# Patient Record
Sex: Male | Born: 1980 | Race: White | Hispanic: No | Marital: Married | Smoking: Current some day smoker
Health system: Southern US, Community
[De-identification: ages and names within clinical notes are randomized; demographics above are authoritative.]

## PROBLEM LIST (undated history)

## (undated) DIAGNOSIS — G43909 Migraine, unspecified, not intractable, without status migrainosus: Secondary | ICD-10-CM

## (undated) DIAGNOSIS — G40909 Epilepsy, unspecified, not intractable, without status epilepticus: Secondary | ICD-10-CM

## (undated) DIAGNOSIS — F419 Anxiety disorder, unspecified: Secondary | ICD-10-CM

## (undated) DIAGNOSIS — Z8619 Personal history of other infectious and parasitic diseases: Secondary | ICD-10-CM

## (undated) DIAGNOSIS — F5221 Male erectile disorder: Secondary | ICD-10-CM

## (undated) DIAGNOSIS — K219 Gastro-esophageal reflux disease without esophagitis: Secondary | ICD-10-CM

## (undated) HISTORY — DX: Anxiety disorder, unspecified: F41.9

## (undated) HISTORY — DX: Migraine, unspecified, not intractable, without status migrainosus: G43.909

## (undated) HISTORY — DX: Male erectile disorder: F52.21

## (undated) HISTORY — DX: Gastro-esophageal reflux disease without esophagitis: K21.9

## (undated) HISTORY — DX: Personal history of other infectious and parasitic diseases: Z86.19

## (undated) HISTORY — DX: Epilepsy, unspecified, not intractable, without status epilepticus: G40.909

---
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
## 2003-07-30 ENCOUNTER — Encounter (INDEPENDENT_AMBULATORY_CARE_PROVIDER_SITE_OTHER): Payer: Self-pay | Admitting: Internal Medicine

## 2007-01-20 ENCOUNTER — Ambulatory Visit: Payer: Self-pay | Admitting: Internal Medicine

## 2007-01-20 DIAGNOSIS — H6120 Impacted cerumen, unspecified ear: Secondary | ICD-10-CM | POA: Insufficient documentation

## 2007-01-24 ENCOUNTER — Encounter (INDEPENDENT_AMBULATORY_CARE_PROVIDER_SITE_OTHER): Payer: Self-pay | Admitting: Internal Medicine

## 2007-01-26 ENCOUNTER — Encounter (INDEPENDENT_AMBULATORY_CARE_PROVIDER_SITE_OTHER): Payer: Self-pay | Admitting: Internal Medicine

## 2007-02-06 ENCOUNTER — Ambulatory Visit: Payer: Self-pay | Admitting: Internal Medicine

## 2007-02-07 LAB — CONVERTED CEMR LAB
ALT: 13 units/L (ref 0–53)
AST: 16 units/L (ref 0–37)
Albumin: 4.4 g/dL (ref 3.5–5.2)
Alkaline Phosphatase: 57 units/L (ref 39–117)
BUN: 18 mg/dL (ref 6–23)
Basophils Absolute: 0 10*3/uL (ref 0.0–0.1)
Basophils Relative: 0.7 % (ref 0.0–1.0)
Bilirubin, Direct: 0.1 mg/dL (ref 0.0–0.3)
CO2: 30 meq/L (ref 19–32)
Calcium: 9.1 mg/dL (ref 8.4–10.5)
Chloride: 108 meq/L (ref 96–112)
Cholesterol: 127 mg/dL (ref 0–200)
Creatinine, Ser: 1.1 mg/dL (ref 0.4–1.5)
Eosinophils Absolute: 0.2 10*3/uL (ref 0.0–0.6)
Eosinophils Relative: 3.5 % (ref 0.0–5.0)
GFR calc Af Amer: 106 mL/min
GFR calc non Af Amer: 87 mL/min
Glucose, Bld: 86 mg/dL (ref 70–99)
HCT: 44.8 % (ref 39.0–52.0)
HDL: 40.8 mg/dL (ref >39.0)
Hemoglobin: 15.1 g/dL (ref 13.0–17.0)
LDL Cholesterol: 65 mg/dL (ref 0–99)
Lymphocytes Relative: 41.1 % (ref 12.0–46.0)
MCHC: 33.7 g/dL (ref 30.0–36.0)
MCV: 86.4 fL (ref 78.0–100.0)
Monocytes Absolute: 0.7 10*3/uL (ref 0.2–0.7)
Monocytes Relative: 10.7 % (ref 3.0–11.0)
Neutro Abs: 2.7 10*3/uL (ref 1.4–7.7)
Neutrophils Relative %: 44 % (ref 43.0–77.0)
Platelets: 290 10*3/uL (ref 150–400)
Potassium: 3.9 meq/L (ref 3.5–5.1)
RBC: 5.19 M/uL (ref 4.22–5.81)
RDW: 12.7 % (ref 11.5–14.6)
Sodium: 145 meq/L (ref 135–145)
TSH: 1.81 microintl units/mL (ref 0.35–5.50)
Total Bilirubin: 1.1 mg/dL (ref 0.3–1.2)
Total CHOL/HDL Ratio: 3.1
Total Protein: 6.2 g/dL (ref 6.0–8.3)
Triglycerides: 107 mg/dL (ref 0–149)
VLDL: 21 mg/dL (ref 0–40)
WBC: 6.1 10*3/uL (ref 4.5–10.5)

## 2007-07-12 ENCOUNTER — Emergency Department (HOSPITAL_COMMUNITY): Admission: EM | Admit: 2007-07-12 | Discharge: 2007-07-12 | Payer: Self-pay | Admitting: Family Medicine

## 2007-07-16 ENCOUNTER — Emergency Department (HOSPITAL_COMMUNITY): Admission: EM | Admit: 2007-07-16 | Discharge: 2007-07-16 | Payer: Self-pay | Admitting: Emergency Medicine

## 2007-11-02 ENCOUNTER — Ambulatory Visit: Payer: Self-pay | Admitting: Family Medicine

## 2008-02-01 ENCOUNTER — Ambulatory Visit: Payer: Self-pay | Admitting: Family Medicine

## 2008-07-09 ENCOUNTER — Ambulatory Visit: Payer: Self-pay | Admitting: Family Medicine

## 2008-07-09 DIAGNOSIS — J309 Allergic rhinitis, unspecified: Secondary | ICD-10-CM | POA: Insufficient documentation

## 2009-03-20 ENCOUNTER — Ambulatory Visit: Payer: Self-pay | Admitting: Family Medicine

## 2009-08-08 ENCOUNTER — Ambulatory Visit: Payer: Self-pay | Admitting: Family Medicine

## 2009-08-08 DIAGNOSIS — J111 Influenza due to unidentified influenza virus with other respiratory manifestations: Secondary | ICD-10-CM | POA: Insufficient documentation

## 2010-08-04 ENCOUNTER — Ambulatory Visit
Admission: RE | Admit: 2010-08-04 | Discharge: 2010-08-04 | Payer: Self-pay | Source: Home / Self Care | Attending: Family Medicine | Admitting: Family Medicine

## 2010-08-04 ENCOUNTER — Encounter: Payer: Self-pay | Admitting: Family Medicine

## 2010-08-11 NOTE — Assessment & Plan Note (Signed)
Summary: STOMACH,BODY ACHES/CLE   Vital Signs:  Patient profile:   30 year old male Height:      72.25 inches Weight:      183.25 pounds BMI:     24.77 Temp:     101.8 degrees F oral Pulse rate:   92 / minute Pulse rhythm:   regular BP sitting:   104 / 74  (left arm) Cuff size:   large  Vitals Entered By: Delilah Shan CMA Duncan Dull) (August 08, 2009 11:51 AM) CC: Stomach, body aches, fever   History of Present Illness: 30 yo with acute onset of nausea, vomiting, muscle aches yesterday. T max 101.8 (temp in office today). Last vomited this morning, still nauseated. Did develop diarrhea this morning as well. Feels like his sinuses are congested all of a sudden, sneezing. No sick contacts. No cough, wheezing, or shortness of breath. Has been able to keep water down, drinking a lot of it to stay hydrated.  Current Medications (verified): 1)  Advil 200 Mg Tabs (Ibuprofen) .... As Needed 2)  Nasonex 50 Mcg/act Susp (Mometasone Furoate) .... 2 Sprays Each Nostril Once Daily As Needed. 3)  Mucinex 600 Mg Xr12h-Tab (Guaifenesin) .... Otc As Directed. 4)  Promethazine Hcl 25 Mg Tabs (Promethazine Hcl) .Marland Kitchen.. 1 Tab By Mouth Every 6 Hours As Needed For Nausea. 5)  Tamiflu 75 Mg Caps (Oseltamivir Phosphate) .... Take One Capsule By Mouth Daily X 5 Days  Allergies (verified): No Known Drug Allergies  Review of Systems      See HPI General:  Complains of chills, fever, and sweats. ENT:  Complains of nasal congestion; denies earache, postnasal drainage, sinus pressure, and sore throat. GI:  Complains of diarrhea, nausea, and vomiting. MS:  Complains of muscle aches.  Physical Exam  General:  alert, well-developed, well-nourished, and well-hydrated, non toxic. Ears:  TMs retracted with increased fluid bilat Nose:  no airflow obstruction, mucosal erythema, and mucosal edema.  sinuses neg Mouth:  MMM Lungs:  moist cough, no crackles and no wheezes.   Heart:  Normal rate and regular  rhythm. S1 and S2 normal without gallop, murmur, click, rub or other extra sounds. Abdomen:  Bowel sounds positive,abdomen soft and non-tender without masses, organomegaly or hernias noted. Psych:  normally interactive and good eye contact.     Impression & Recommendations:  Problem # 1:  INFLUENZA LIKE ILLNESS (ICD-487.1) Assessment New Within window of Tamiflu, given 5 day course of Tamiflu.   Phenergan for nausea, push fluids. I discussed with the patient that the CDC and [place] Infectious disease personel are recommended that we not do the rapid influenza screening test on people that have a clinical history and exam consistent with the diagnosis. See patient instructions for details.  Complete Medication List: 1)  Advil 200 Mg Tabs (Ibuprofen) .... As needed 2)  Nasonex 50 Mcg/act Susp (Mometasone furoate) .... 2 sprays each nostril once daily as needed. 3)  Mucinex 600 Mg Xr12h-tab (Guaifenesin) .... Otc as directed. 4)  Promethazine Hcl 25 Mg Tabs (Promethazine hcl) .Marland Kitchen.. 1 tab by mouth every 6 hours as needed for nausea. 5)  Tamiflu 75 Mg Caps (Oseltamivir phosphate) .... Take one capsule by mouth daily x 5 days  Patient Instructions: 1)  NIce to meet you, Chrissie Noa. 2)  Sorry you are not feeling well. 3)  This is likely a GI bug but you also have all symptoms of the flu. 4)  Please take Tamiflu 75 mg- 1 tab daily. 5)  Take phenergan  as needed every 4-6 hours. 6)  Drink plenty of fluids. 7)  Call us or go to ER if you develop worsening symptoms over the weekend or cannot keep any fluids down. Prescriptions: TAMIFLU 75 MG CAPS (OSELTAMIVIR PHOSPHATE) Take one capsule by mouth daily x 5 days  #5 x 0   Entered and Authorized by:   Ruthe Mannan MD   Signed by:   Ruthe Mannan MD on 08/08/2009   Method used:   Historical   RxID:   [id number] PROMETHAZINE HCL 25 MG TABS (PROMETHAZINE HCL) 1 tab by mouth every 6 hours as needed for nausea.  #20 x 0   Entered and Authorized by:    Ruthe Mannan MD   Signed by:   Ruthe Mannan MD on 08/08/2009   Method used:   Electronically to        [facility]* (retail)       [address]       [place], [place]  [address]       Ph: [phone]       Fax: [phone]   RxID:   [id number]   Current Allergies (reviewed today): No known allergies

## 2010-08-11 NOTE — Letter (Signed)
Summary: Out of Work  [facility]  [address] [place], [place] [address]   Phone: [phone]  Fax: [phone]    August 08, 2009   Employee:  LOFTON LEON Twiggs    To Whom It May Concern:   For Medical reasons, please excuse the above named employee from work for the following dates:  Start:   08/08/2009  End:   08/11/2009  If you need additional information, please feel free to contact our office.         Sincerely,    Ruthe Mannan MD

## 2010-08-13 NOTE — Assessment & Plan Note (Signed)
Summary: Chest congestion   Vital Signs:  Patient profile:   30 year old male Height:      72.25 inches Weight:      177 pounds BMI:     23.93 Temp:     98.5 degrees F oral Pulse rate:   69 / minute Pulse rhythm:   regular BP sitting:   120 / 80  (right arm) Cuff size:   regular  Vitals Entered By: Linde Gillis CMA Duncan Dull) (August 04, 2010 3:41 PM) CC: chest congestion   History of Present Illness: CC: chest congestion  9d h/o chest congestion.  started with blowing nose and congestion.  tried mucinex x 1+ wk.  Started to be clearing up then recently getting worse.  Also ST.  feeling some better today, but still feels in chest.  + HA.  Mild body aches.  Cough productive of yellow/green sputum, seems to be clearing up.  + nasal drainage, now clearing up.  No fevers/chills.  no abd pain, n/v/d, myalgias or arthralgias, rashes.  + sick contacts at work.  No smokers at home, no h/o asthma/allergies.  Current Medications (verified): 1)  Advil 200 Mg Tabs (Ibuprofen) .... As Needed 2)  Mucinex 600 Mg Xr12h-Tab (Guaifenesin) .... Otc As Directed.  Allergies (verified): No Known Drug Allergies  Social History: no smokers at home Marital Status: Married Children: 0 Occupation: [employer]--works in [employer]  Review of Systems       per HPI  Physical Exam  General:  alert, well-developed, well-nourished, and well-hydrated, non toxic. Head:  Normocephalic and atraumatic without obvious abnormalities. No apparent alopecia or balding.  sinuses NT Eyes:  PERRLA, EOMI, no injection Ears:  TMs clear bilaterally Nose:  nares with discharge bilaterally Mouth:  slight pharyngeal erythema, MMM Neck:  No deformities, masses, or tenderness noted.  no LAD Lungs:  Normal respiratory effort, chest expands symmetrically. Lungs are clear to auscultation, no crackles or wheezes. Heart:  Normal rate and regular rhythm. S1 and S2 normal without gallop, murmur, click, rub or other extra  sounds. Pulses:  2+ rad pulses Extremities:  no pedal edema Skin:  Intact without suspicious lesions or rashes   Impression & Recommendations:  Problem # 1:  URI (ICD-465.9)  Instructed on symptomatic treatment. Call if symptoms persist or worsen.  advised to call at end of week with update if not better with sxs for consideration of abx.  seems to be resolving on its own.  The following medications were removed from the medication list:    Promethazine Hcl 25 Mg Tabs (Promethazine hcl) .Marland Kitchen... 1 tab by mouth every 6 hours as needed for nausea. His updated medication list for this problem includes:    Advil 200 Mg Tabs (Ibuprofen) .Marland Kitchen... As needed    Mucinex 600 Mg Xr12h-tab (Guaifenesin) ..... Otc as directed.    Cheratussin Ac 100-10 Mg/53ml Syrp (Guaifenesin-codeine) ..... One teaspoon at bedtime as needed cough  Complete Medication List: 1)  Advil 200 Mg Tabs (Ibuprofen) .... As needed 2)  Mucinex 600 Mg Xr12h-tab (Guaifenesin) .... Otc as directed. 3)  Cheratussin Ac 100-10 Mg/47ml Syrp (Guaifenesin-codeine) .... One teaspoon at bedtime as needed cough  Patient Instructions: 1)  Sounds like you have a viral upper respiratory infection. 2)  Antibiotics are not needed for this.  Viral infections usually take 7-10 days to resolve.  The cough can last 4 weeks to go away. 3)  Use medication as prescribed: cheratussin 4)  Please return if you are not improving as expected,  or if you have high fevers (>101.5) or difficulty swallowing.  update [place] if not  5)  Call clinic with questions.  Pleasure to see you today!  Prescriptions: CHERATUSSIN AC 100-10 MG/5ML SYRP (GUAIFENESIN-CODEINE) one teaspoon at bedtime as needed cough  #100cc x 0   Entered and Authorized by:   Eustaquio Boyden  MD   Signed by:   Eustaquio Boyden  MD on 08/04/2010   Method used:   Print then Give to Patient   RxID:   [id number]    Orders Added: 1)  Est. Patient Level III [78469]    Current Allergies  (reviewed today): No known allergies

## 2010-10-12 ENCOUNTER — Encounter: Payer: Self-pay | Admitting: Family Medicine

## 2011-04-16 LAB — POCT RAPID STREP A: Streptococcus, Group A Screen (Direct): NEGATIVE

## 2011-04-21 ENCOUNTER — Ambulatory Visit (INDEPENDENT_AMBULATORY_CARE_PROVIDER_SITE_OTHER): Payer: BC Managed Care – PPO | Admitting: Family Medicine

## 2011-04-21 ENCOUNTER — Encounter: Payer: Self-pay | Admitting: Family Medicine

## 2011-04-21 VITALS — BP 116/80 | HR 80 | Temp 98.2°F | Ht 71.0 in | Wt 161.8 lb

## 2011-04-21 DIAGNOSIS — D229 Melanocytic nevi, unspecified: Secondary | ICD-10-CM | POA: Insufficient documentation

## 2011-04-21 DIAGNOSIS — Z113 Encounter for screening for infections with a predominantly sexual mode of transmission: Secondary | ICD-10-CM

## 2011-04-21 DIAGNOSIS — D239 Other benign neoplasm of skin, unspecified: Secondary | ICD-10-CM

## 2011-04-21 DIAGNOSIS — Z8249 Family history of ischemic heart disease and other diseases of the circulatory system: Secondary | ICD-10-CM

## 2011-04-21 DIAGNOSIS — Z23 Encounter for immunization: Secondary | ICD-10-CM

## 2011-04-21 DIAGNOSIS — Z0000 Encounter for general adult medical examination without abnormal findings: Secondary | ICD-10-CM

## 2011-04-21 LAB — BASIC METABOLIC PANEL
CO2: 29 mEq/L (ref 19–32)
Calcium: 9.3 mg/dL (ref 8.4–10.5)
Chloride: 106 mEq/L (ref 96–112)
Glucose, Bld: 94 mg/dL (ref 70–99)
Sodium: 142 mEq/L (ref 135–145)

## 2011-04-21 LAB — LIPID PANEL
HDL: 47 mg/dL (ref >39.00)
Triglycerides: 51 mg/dL (ref 0.0–149.0)

## 2011-04-21 NOTE — Assessment & Plan Note (Signed)
STD screen today.  Return for urine for GC/CT.

## 2011-04-21 NOTE — Assessment & Plan Note (Addendum)
Reviewed preventative protocols. Updated tetanus and flu. Discussed healthy living, rec increased aerobic exercise and decreased fried foods given family history early CAD (father). Reviewed blood work from 2008.  Will recheck today.  Goal LDL <100 likely given fmhx.  Previously at goal.

## 2011-04-21 NOTE — Patient Instructions (Addendum)
I'd recommend increase in aerobic exercise and decrease in fried foods. Blood work today.  Return one morning without voiding to collect urine for rest of STD check. Tdap today, flu shot today. Good to meet you today, call us with questions.

## 2011-04-21 NOTE — Progress Notes (Signed)
Addended by: Josph Macho A on: 04/21/2011 09:15 AM   Modules accepted: Orders
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
## 2011-04-21 NOTE — Assessment & Plan Note (Signed)
Discussed sunscreen use. Multiple nevi, all looking ok, one on left knee darker. rec set up with derm for skin check and to check one on left knee one.  Pt states doesn't need referral for this. Anticipate common wart right hand, rec OTC salicylic acid preparation.

## 2011-04-22 LAB — HIV ANTIBODY (ROUTINE TESTING W REFLEX): HIV: NONREACTIVE

## 2011-04-23 NOTE — Progress Notes (Signed)
Addended by: Alvina Chou on: 04/23/2011 09:43 AM   Modules accepted: Orders

## 2011-04-24 LAB — GC/CHLAMYDIA PROBE AMP, URINE: GC Probe Amp, Urine: NEGATIVE

## 2012-07-09 ENCOUNTER — Ambulatory Visit (INDEPENDENT_AMBULATORY_CARE_PROVIDER_SITE_OTHER): Payer: BC Managed Care – PPO | Admitting: Family Medicine

## 2012-07-09 VITALS — BP 152/88 | HR 110 | Temp 99.0°F | Resp 16 | Ht 73.56 in | Wt 188.0 lb

## 2012-07-09 DIAGNOSIS — B349 Viral infection, unspecified: Secondary | ICD-10-CM

## 2012-07-09 DIAGNOSIS — R05 Cough: Secondary | ICD-10-CM

## 2012-07-09 DIAGNOSIS — R059 Cough, unspecified: Secondary | ICD-10-CM

## 2012-07-09 DIAGNOSIS — B9789 Other viral agents as the cause of diseases classified elsewhere: Secondary | ICD-10-CM

## 2012-07-09 DIAGNOSIS — R509 Fever, unspecified: Secondary | ICD-10-CM

## 2012-07-09 LAB — POCT INFLUENZA A/B: Influenza A, POC: NEGATIVE

## 2012-07-09 MED ORDER — HYDROCODONE-HOMATROPINE 5-1.5 MG/5ML PO SYRP: 5.0000 mL | ORAL_SOLUTION | ORAL | Status: DC | PRN

## 2012-07-09 MED ORDER — BENZONATATE 100 MG PO CAPS: ORAL_CAPSULE | ORAL | Status: DC

## 2012-07-09 MED ORDER — FLUTICASONE PROPIONATE 50 MCG/ACT NA SUSP: 2.0000 | Freq: Every day | NASAL | Status: DC

## 2012-07-09 NOTE — Progress Notes (Signed)
Subjective: Patient has been sick for about 48 hours with a cough, nasal congestion, and is running fevers. Has body aches. He did not do a flu shot this year. Lives alone  Objective: TMs normal. Her nose congested. Throat mildly erythematous. Neck supple without significant nodes. Chest is clear to auscultation.  Assessment: Rales syndrome  Plan: Flu swab

## 2012-07-09 NOTE — Patient Instructions (Signed)
Take tylenol or ibuprofen for fever and aches

## 2012-07-10 ENCOUNTER — Telehealth: Payer: Self-pay | Admitting: Family Medicine

## 2012-07-10 NOTE — Telephone Encounter (Signed)
Patient Information:  Caller Name: Elijah  Phone: [phone]  Patient: Elijah Huang, Elijah Huang  Gender: Male  DOB: 1981/01/30  Age: 31 Years  PCP: Ruthe Mannan [facility])  Office Follow Up:  Does the office need to follow up with this patient?: No  Instructions For The Office: N/A  RN Note:  Has been taking the Motrin 400mg  q6 hours but the fever has returned.  Has been up to 101.  Was swabbed for the flu and same was negative.  Symptoms  Reason For Call & Symptoms: Started with cold/cough sx with a fever on 12/27.  Seen in UC 12/29 and given Hycodon cough meds.  Sputum is yellow.  Reviewed Health History In EMR: Yes  Reviewed Medications In EMR: Yes  Reviewed Allergies In EMR: Yes  Reviewed Surgeries / Procedures: Yes  Date of Onset of Symptoms: 07/07/2012  Any Fever: Yes  Fever Taken: Oral  Fever Time Of Reading: 13:00:00  Fever Last Reading: 99.5  Guideline(s) Used:  Cough  Disposition Per Guideline:   See Today or Tomorrow in Office  Reason For Disposition Reached:   Patient wants to be seen  Advice Given:  N/A  Appointment Scheduled:  07/11/2012 08:15:00 Appointment Scheduled Provider:  Roxy Manns [facility])

## 2012-07-11 NOTE — Telephone Encounter (Signed)
Seen at urgent care and dx with flu 07/09/2012. dispo was to be seen today by Dr. Milinda Antis, but no appt made. Can we call for update on pt today?

## 2012-07-11 NOTE — Telephone Encounter (Signed)
Great thanks.  Yeah agree with looking into appt not scheduled when CAN said scheduled.  This has happened in past.

## 2012-07-11 NOTE — Telephone Encounter (Signed)
Spoke with patient and he said he is feeling much better today and didn't need to be seen. He said he called this morning to cancel the appt that was made for him yesterday, but no one could find it on the schedule, so I'm not sure what happened, but he didn't need it after all. It doesn't appear that one was ever scheduled based on his appt hx even though it was documented by CAN that it was. Will forward to [facility] for investigation with CAN.

## 2012-07-12 DIAGNOSIS — F5221 Male erectile disorder: Secondary | ICD-10-CM

## 2012-07-12 HISTORY — DX: Male erectile disorder: F52.21

## 2012-07-13 ENCOUNTER — Telehealth: Payer: Self-pay

## 2012-07-13 NOTE — Telephone Encounter (Signed)
It may take several days to weeks for a viral cough to resolve.  If he is not worsening, I would suggest he continue the cough medicine.  Plenty of fluids and rest.  If he IS worsening or if he is spiking fevers or developing new symptoms, he should return.

## 2012-07-13 NOTE — Telephone Encounter (Signed)
Patient saw Dr. Alwyn Ren  He is no better, taking OTC and the cough meds that was given to him.  [facility] [place]  [facility]:  [phone] - [address]:  [phone] - W

## 2012-07-13 NOTE — Telephone Encounter (Signed)
Patient states he got better, then symptoms returned. He states cough is not any worse, symptoms just not better.  He states cough medications do help, but he thinks he may now need an antibiotic sent in because he has not improved, does he need to return for this? I told him he may need to, but I would ask . Please advise.

## 2012-07-14 NOTE — Telephone Encounter (Signed)
Thanks, patient advised.  

## 2012-11-09 ENCOUNTER — Ambulatory Visit (INDEPENDENT_AMBULATORY_CARE_PROVIDER_SITE_OTHER): Payer: BC Managed Care – PPO | Admitting: Family Medicine

## 2012-11-09 ENCOUNTER — Encounter: Payer: Self-pay | Admitting: Family Medicine

## 2012-11-09 VITALS — BP 118/80 | HR 80 | Temp 98.6°F | Ht 72.0 in | Wt 186.5 lb

## 2012-11-09 DIAGNOSIS — M542 Cervicalgia: Secondary | ICD-10-CM
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
## 2013-05-04 ENCOUNTER — Ambulatory Visit (INDEPENDENT_AMBULATORY_CARE_PROVIDER_SITE_OTHER): Payer: BC Managed Care – PPO | Admitting: Family Medicine

## 2013-05-04 ENCOUNTER — Encounter: Payer: Self-pay | Admitting: Family Medicine

## 2013-05-04 VITALS — BP 126/82 | HR 76 | Temp 98.4°F | Wt 186.5 lb

## 2013-05-04 DIAGNOSIS — K219 Gastro-esophageal reflux disease without esophagitis: Secondary | ICD-10-CM

## 2013-05-04 MED ORDER — OMEPRAZOLE 40 MG PO CPDR: 40.0000 mg | DELAYED_RELEASE_CAPSULE | Freq: Every day | ORAL | Status: DC

## 2013-05-04 NOTE — Progress Notes (Signed)
Patient seen and examined with PA student Ricarda Frame.  Note reviewed, agree with assessment and plan unless changes documented in my note.  CC: discuss worsening heartburn.  Pleasant 32 yo with no significant pmh presents today with several week worsening of heartburn.  H/o heartburn for years.  Worsening over last 3-5 months.  Takes up to 10 tums a day which cause only temporary relief.  Worse at night.  Describes mid sternal burning.  Tried OTC med (?prilosec) for 1 week 4 months ago with no relief of sxs. Vomiting x 2.  Emesis food.  NBNB. Denies fevers/chills, abd pain, nausea, diarrhea or constipation. No dysphagia, no early satiety, no weight changes. Tried modifying diet.  Avoiding spicy foods - worsens sxs. No change in stress recently. No chest pain, shortness of breath or palpitations.  Smokeless tobacco - chews twice a day. 2 sodas/day, some coffee in am. Goody's powder - about 3-4 x/wk  Body mass index is 25.29 kg/(m^2).   Past Medical History  Diagnosis Date  . History of chicken pox      PE: GEN: NAD, WDWN CM ABD: soft, NABS, ND, mild epigastric discomfort to palpation

## 2013-05-04 NOTE — Patient Instructions (Signed)
Flu shot today. Head of bed elevated. Avoidance of citrus, fatty foods, chocolate, peppermint, and excessive alcohol, along with sodas, orange juice (acidic drinks) At least a few hours between dinner and bed, minimize naps after eating. Treat heartburn with prescription strenth omeprazole 40mg  once daily for 3 weeks then as needed.  Gastroesophageal Reflux Disease, Adult Gastroesophageal reflux disease (GERD) happens when acid from your stomach flows up into the esophagus. When acid comes in contact with the esophagus, the acid causes soreness (inflammation) in the esophagus. Over time, GERD may create small holes (ulcers) in the lining of the esophagus. CAUSES   Increased body weight. This puts pressure on the stomach, making acid rise from the stomach into the esophagus.  Smoking. This increases acid production in the stomach.  Drinking alcohol. This causes decreased pressure in the lower esophageal sphincter (valve or ring of muscle between the esophagus and stomach), allowing acid from the stomach into the esophagus.  Late evening meals and a full stomach. This increases pressure and acid production in the stomach.  A malformed lower esophageal sphincter. Sometimes, no cause is found. SYMPTOMS   Burning pain in the lower part of the mid-chest behind the breastbone and in the mid-stomach area. This may occur twice a week or more often.  Trouble swallowing.  Sore throat.  Dry cough.  Asthma-like symptoms including chest tightness, shortness of breath, or wheezing. DIAGNOSIS  Your caregiver may be able to diagnose GERD based on your symptoms. In some cases, X-rays and other tests may be done to check for complications or to check the condition of your stomach and esophagus. TREATMENT  Your caregiver may recommend over-the-counter or prescription medicines to help decrease acid production. Ask your caregiver before starting or adding any new medicines.  HOME CARE INSTRUCTIONS    Change the factors that you can control. Ask your caregiver for guidance concerning weight loss, quitting smoking, and alcohol consumption.  Avoid foods and drinks that make your symptoms worse, such as:  Caffeine or alcoholic drinks.  Chocolate.  Peppermint or mint flavorings.  Garlic and onions.  Spicy foods.  Citrus fruits, such as oranges, lemons, or limes.  Tomato-based foods such as sauce, chili, salsa, and pizza.  Fried and fatty foods.  Avoid lying down for the 3 hours prior to your bedtime or prior to taking a nap.  Eat small, frequent meals instead of large meals.  Wear loose-fitting clothing. Do not wear anything tight around your waist that causes pressure on your stomach.  Raise the head of your bed 6 to 8 inches with wood blocks to help you sleep. Extra pillows will not help.  Only take over-the-counter or prescription medicines for pain, discomfort, or fever as directed by your caregiver.  Do not take aspirin, ibuprofen, or other nonsteroidal anti-inflammatory drugs (NSAIDs). SEEK IMMEDIATE MEDICAL CARE IF:   You have pain in your arms, neck, jaw, teeth, or back.  Your pain increases or changes in intensity or duration.  You develop nausea, vomiting, or sweating (diaphoresis).  You develop shortness of breath, or you faint.  Your vomit is green, yellow, black, or looks like coffee grounds or blood.  Your stool is red, bloody, or black. These symptoms could be signs of other problems, such as heart disease, gastric bleeding, or esophageal bleeding. MAKE SURE YOU:   Understand these instructions.  Will watch your condition.  Will get help right away if you are not doing well or get worse. Document Released: 04/07/2005 Document Revised: 09/20/2011 Document  Reviewed: 01/15/2011 [employer] Patient Information 2014 [place], [place].

## 2013-05-04 NOTE — Assessment & Plan Note (Signed)
Uncomplicated GERD.  Not responsive to OTC meds. Treat with 3 wk course of prescription omeprazole 40mg  daily. Lifestyle factors that influence GERD discussed in detail. Update if not improving with this. Pt agrees with plan. No red flags today.

## 2013-05-04 NOTE — Progress Notes (Signed)
  Subjective:    Patient ID: Elijah Huang, male    DOB: [birth year], 32 y.o.   MRN: [id number]  HPI Presents with worsening heartburn over last 3-5 months. Past history of mild intermitant heartburn  for several years. Heartburn described as a burning sensation in chest. Worse at nighttime. Thrown up twice due to heartburn (one episode 3 weeks ago and another 2 weeks ago).Tums help for few hours but heartburn persists. Takes approx 10 Tums a day.Tried Prilosec for 1 week 4 months ago without noticeable change in symptoms. Has tried avoiding spicy,fatty foods, and sodas with mild relief. Denies changes in life stressors or dietary habits  Denies dysphagia, early satiety, weight changes, diarrhea, constipation, abdominal pain, SOB, palpitations, cough.   Smokeless tobacco use twice a day Caffeine use:1-2 cups coffee and 1-2 sodas goodys powder approx 3 times a week  Review of Systems Per HPI    Objective:   Physical Exam  Constitutional: He is oriented to person, place, and time. He appears well-developed and well-nourished.  HENT:  Head: Normocephalic and atraumatic.  Mouth/Throat: Oropharynx is clear and moist. No oropharyngeal exudate.  Eyes: Conjunctivae are normal. Pupils are equal, round, and reactive to light. Right eye exhibits no discharge. Left eye exhibits no discharge. No scleral icterus.  Neck: Normal range of motion. Neck supple.  Cardiovascular: Normal rate, regular rhythm and normal heart sounds.  Exam reveals no gallop and no friction rub.   No murmur heard. Pulmonary/Chest: Effort normal and breath sounds normal. No respiratory distress. He has no wheezes. He has no rales. He exhibits no tenderness.  Abdominal: Soft. Bowel sounds are normal. He exhibits no distension and no mass. There is tenderness (epigastric ). There is no rebound and no guarding.  Neurological: He is alert and oriented to person, place, and time.  Skin: Skin is warm and dry. No rash noted. No erythema.        Assessment & Plan:  GERD Omeprazole 40 mg once daily for 3 weeks, then as needed.   Lifestyle changes: Decrease caffeine, goody's powder, spearmints/peppermints.spicy, fatty, acidic foods, tobacco. Avoid eating a few hours before bedtime Elevate head of the bed for nighttime relief.   Instructed to call if symptoms worsen or fail to improve.   Flu shot today.

## 2013-05-30 ENCOUNTER — Ambulatory Visit (INDEPENDENT_AMBULATORY_CARE_PROVIDER_SITE_OTHER): Payer: BC Managed Care – PPO | Admitting: Family Medicine

## 2013-05-30 ENCOUNTER — Encounter: Payer: Self-pay | Admitting: Family Medicine

## 2013-05-30 VITALS — BP 140/96 | HR 68 | Temp 98.9°F | Wt 185.0 lb

## 2013-05-30 DIAGNOSIS — N529 Male erectile dysfunction, unspecified: Secondary | ICD-10-CM | POA: Insufficient documentation

## 2013-05-30 LAB — BASIC METABOLIC PANEL
Calcium: 9.8 mg/dL (ref 8.4–10.5)
Chloride: 103 mEq/L (ref 96–112)
Creatinine, Ser: 1 mg/dL (ref 0.4–1.5)
GFR: 90.91 mL/min (ref >60.00)
Sodium: 138 mEq/L (ref 135–145)

## 2013-05-30 LAB — LIPID PANEL
Cholesterol: 140 mg/dL (ref 0–200)
Triglycerides: 99 mg/dL (ref 0.0–149.0)
VLDL: 19.8 mg/dL (ref 0.0–40.0)

## 2013-05-30 LAB — TSH: TSH: 1.42 u[IU]/mL (ref 0.35–5.50)

## 2013-05-30 NOTE — Progress Notes (Signed)
  Subjective:    Patient ID: Elijah Huang, male    DOB: 1980-09-30, 32 y.o.   MRN: [id number]  HPI CC: discuss personal issue  ED - prior marriage sexually active without trouble.  Divorced.  Then over last 3 years no sexual activity.  Over last 3 months in new relationship - trying intercourse but has been unable to completely obtain erection.  No ejaculation.  Trouble obtaining firm erection in the first place, unable to sustain.  Has not had any nocturnal penile tumescence recently.  Has noticed some trouble obtaining erection when alone as well.  Good libido, sexual desire present. Denies leg pains. Denies chest pain, tightness, dyspnea.  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  Pt does not smoke but dose use smokeless tobacco.  Past Medical History  Diagnosis Date  . History of chicken pox   . GERD (gastroesophageal reflux disease)     Family History  Problem Relation Age of Onset  . Diabetes Father   . Coronary artery disease Father 13    cabg 57, nonsmoker, obese  . Coronary artery disease Paternal Grandfather 35    deceased from same  . Cancer Maternal Grandfather     pancreatic  . Stroke Neg Hx     Review of Systems Per HPI     Objective:   Physical Exam  Nursing note and vitals reviewed. Constitutional: He appears well-developed and well-nourished. No distress.  Cardiovascular: Normal rate, regular rhythm, normal heart sounds and intact distal pulses.   No murmur heard. Pulses:      Carotid pulses are 2+ on the right side, and 2+ on the left side.      Radial pulses are 2+ on the right side, and 2+ on the left side.       Dorsalis pedis pulses are 2+ on the right side, and 2+ on the left side.       Posterior tibial pulses are 2+ on the right side, and 2+ on the left side.  Pulmonary/Chest: Effort normal and breath sounds normal. No respiratory distress. He has no wheezes. He has no rales.  Abdominal: Hernia confirmed negative  in the right inguinal area and confirmed negative in the left inguinal area.  Genitourinary: Testes normal and penis normal. Right testis shows no mass, no swelling and no tenderness. Right testis is descended. Left testis shows no mass, no swelling and no tenderness. Left testis is descended. Circumcised. No hypospadias, penile erythema or penile tenderness.  Unable to reliably palpate penile arteries  Lymphadenopathy:       Right: No inguinal adenopathy present.       Left: No inguinal adenopathy present.       Assessment & Plan:

## 2013-05-30 NOTE — Assessment & Plan Note (Signed)
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  Less likely psychological ED (although this is a new relationship). Strong fmhx premature CAD. Recheck FLP and glu today. I will refer to urology for further evaluation of organic ED in this otherwise healthy 32 yo. Lab Results  Component Value Date   CHOL 115 04/21/2011   HDL 47.00 04/21/2011   LDLCALC 58 04/21/2011   TRIG 51.0 04/21/2011   CHOLHDL 2 04/21/2011

## 2013-05-30 NOTE — Progress Notes (Signed)
Pre-visit discussion using our clinic review tool. No additional management support is needed unless otherwise documented below in the visit note.  

## 2013-05-30 NOTE — Patient Instructions (Signed)
Blood work today. Pass by Marion's office for referral to urologist for further evaluation. I will await evaluation by urologist and then may discuss heart evaluation.

## 2013-05-31 ENCOUNTER — Encounter: Payer: Self-pay | Admitting: *Deleted

## 2013-07-03 ENCOUNTER — Encounter: Payer: Self-pay | Admitting: Family Medicine

## 2013-07-12 DIAGNOSIS — G40909 Epilepsy, unspecified, not intractable, without status epilepticus: Secondary | ICD-10-CM

## 2013-07-12 HISTORY — DX: Epilepsy, unspecified, not intractable, without status epilepticus: G40.909

## 2013-08-09 ENCOUNTER — Ambulatory Visit (INDEPENDENT_AMBULATORY_CARE_PROVIDER_SITE_OTHER): Payer: BC Managed Care – PPO | Admitting: Family Medicine

## 2013-08-09 ENCOUNTER — Telehealth: Payer: Self-pay

## 2013-08-09 ENCOUNTER — Encounter: Payer: Self-pay | Admitting: Family Medicine

## 2013-08-09 VITALS — BP 130/90 | HR 72 | Temp 98.2°F | Wt 185.5 lb

## 2013-08-09 DIAGNOSIS — N529 Male erectile dysfunction, unspecified: Secondary | ICD-10-CM

## 2013-08-09 DIAGNOSIS — R21 Rash and other nonspecific skin eruption: Secondary | ICD-10-CM

## 2013-08-09 MED ORDER — TADALAFIL 5 MG PO TABS: 5.0000 mg | ORAL_TABLET | Freq: Every day | ORAL | Status: DC

## 2013-08-09 NOTE — Assessment & Plan Note (Signed)
Improved with cialis 10mg  daily, but off med noticing sxs returning. Recommended longer trial of med and then prolonged slower taper. Refilled cialis 5mg  daily - pt will price out. If too expensive, consider PRN use of PDE5 inhibitor.

## 2013-08-09 NOTE — Progress Notes (Signed)
Pre-visit discussion using our clinic review tool. No additional management support is needed unless otherwise documented below in the visit note.  

## 2013-08-09 NOTE — Progress Notes (Signed)
   Subjective:    Patient ID: Elijah Huang, male    DOB: [birth year], 33 y.o.   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  Started on low dose PDE5 inhibitor cialis 5mg  daily, rec f/u 08/30/2013.  Started taking 10mg  daily per uro recs - noticing significant improvement with this.  Stopped cialis a few weeks ago with overall some improvement prior to initial evaluation but still having issues with maintenance of erection.  Did complete 30d free sample.  Noticing some redness around nasal bridge and nasal moustache - wonders if due to dry skin.  Does get flakes on skin.  Past Medical History  Diagnosis Date  . History of chicken pox   . GERD (gastroesophageal reflux disease)   . ED (erectile dysfunction) of non-organic origin 2014    thought psychogenic by urology [name])     Review of Systems Per HPI    Objective:   Physical Exam  Nursing note and vitals reviewed. Constitutional: He appears well-developed and well-nourished. No distress.  HENT:  Head:    Erythematous scaling between upper lip and nares. Erythematous rash bilaterally lateral to nasal bridge.  Skin: Skin is warm and dry. Rash noted.       Assessment & Plan:

## 2013-08-09 NOTE — Telephone Encounter (Signed)
Pt wanted Cialis sent to [address]. Advised pt cialis was sent to [facility] [address] and rx sent to [facility] was cancelled. Pt voiced understanding.

## 2013-08-09 NOTE — Assessment & Plan Note (Signed)
Present during winter months. Anticipate seborrheic dermatitis - start moisturizer daily (aveeno or eucerin). If not improved, will do antifungal /steroid combination. Less likely rosacea.  Advised to notify me with update on effect of moisturizer.

## 2013-08-09 NOTE — Patient Instructions (Signed)
Let's try cialis 5mg  daily longer course - daily for 1 month, then may change to every other day then may slowly taper off. Price out cialis and let me know if too expensive for other options. For face - may try regular moisturizing with aveeno or eucerin moisturizing cream.  If not improving, let me know.

## 2013-08-10 ENCOUNTER — Telehealth: Payer: Self-pay | Admitting: Family Medicine

## 2013-08-10 NOTE — Telephone Encounter (Signed)
Relevant patient education assigned to patient using Emmi. ° °

## 2013-08-13 ENCOUNTER — Telehealth: Payer: Self-pay | Admitting: *Deleted

## 2013-08-13 NOTE — Telephone Encounter (Signed)
Filled and placed in my out box. 

## 2013-08-13 NOTE — Telephone Encounter (Signed)
PA for Cialis in your IN box for completion.

## 2013-08-14 NOTE — Telephone Encounter (Signed)
PA forms submitted, pending notification from insurance company.

## 2013-08-22 NOTE — Telephone Encounter (Signed)
Approval form received by insurance company. Already signed by provider will be sent to be scanned into patients medical record. Pharmacy notified via fax.

## 2013-08-30 ENCOUNTER — Telehealth: Payer: Self-pay | Admitting: Family Medicine

## 2013-08-30 MED ORDER — TADALAFIL 20 MG PO TABS: 10.0000 mg | ORAL_TABLET | ORAL | Status: DC | PRN

## 2013-08-30 NOTE — Telephone Encounter (Signed)
Initially received approval for cialis 5mg  daily, now 3d later received denial stating quantity limit is 6 tablets every 22 days Will send in cialis 20mg  #6 instead.   plz notify patient.

## 2013-08-31 NOTE — Telephone Encounter (Signed)
Patient notified

## 2013-12-09 ENCOUNTER — Emergency Department (HOSPITAL_BASED_OUTPATIENT_CLINIC_OR_DEPARTMENT_OTHER): Payer: BC Managed Care – PPO

## 2013-12-09 ENCOUNTER — Encounter (HOSPITAL_BASED_OUTPATIENT_CLINIC_OR_DEPARTMENT_OTHER): Payer: Self-pay | Admitting: Emergency Medicine

## 2013-12-09 ENCOUNTER — Emergency Department (HOSPITAL_BASED_OUTPATIENT_CLINIC_OR_DEPARTMENT_OTHER)
Admission: EM | Admit: 2013-12-09 | Discharge: 2013-12-09 | Disposition: A | Discharge status: Home / Self Care | Payer: BC Managed Care – PPO | Attending: Emergency Medicine | Admitting: Emergency Medicine

## 2013-12-09 DIAGNOSIS — Z79899 Other long term (current) drug therapy: Secondary | ICD-10-CM | POA: Insufficient documentation

## 2013-12-09 DIAGNOSIS — Z791 Long term (current) use of non-steroidal anti-inflammatories (NSAID): Secondary | ICD-10-CM | POA: Insufficient documentation

## 2013-12-09 DIAGNOSIS — R569 Unspecified convulsions: Secondary | ICD-10-CM | POA: Insufficient documentation

## 2013-12-09 DIAGNOSIS — K219 Gastro-esophageal reflux disease without esophagitis: Secondary | ICD-10-CM | POA: Insufficient documentation

## 2013-12-09 DIAGNOSIS — Z8619 Personal history of other infectious and parasitic diseases: Secondary | ICD-10-CM | POA: Insufficient documentation

## 2013-12-09 LAB — RAPID URINE DRUG SCREEN, HOSP PERFORMED
Amphetamines: NOT DETECTED
Barbiturates: NOT DETECTED
Benzodiazepines: NOT DETECTED
Cocaine: NOT DETECTED
Opiates: NOT DETECTED
Tetrahydrocannabinol: NOT DETECTED

## 2013-12-09 LAB — CBC
HCT: 44.7 % (ref 39.0–52.0)
HEMOGLOBIN: 15.8 g/dL (ref 13.0–17.0)
MCH: 29.4 pg (ref 26.0–34.0)
MCHC: 35.3 g/dL (ref 30.0–36.0)
MCV: 83.1 fL (ref 78.0–100.0)
Platelets: 273 10*3/uL (ref 150–400)
RBC: 5.38 MIL/uL (ref 4.22–5.81)
RDW: 12.8 % (ref 11.5–15.5)
WBC: 5.5 10*3/uL (ref 4.0–10.5)

## 2013-12-09 LAB — URINALYSIS, ROUTINE W REFLEX MICROSCOPIC
Bilirubin Urine: NEGATIVE
Glucose, UA: NEGATIVE mg/dL
Hgb urine dipstick: NEGATIVE
KETONES UR: NEGATIVE mg/dL
LEUKOCYTES UA: NEGATIVE
Nitrite: NEGATIVE
PROTEIN: NEGATIVE mg/dL
Specific Gravity, Urine: 1.024 (ref 1.005–1.030)
UROBILINOGEN UA: 0.2 mg/dL (ref 0.0–1.0)
pH: 5.5 (ref 5.0–8.0)

## 2013-12-09 LAB — COMPREHENSIVE METABOLIC PANEL
ALT: 22 U/L (ref 0–53)
AST: 19 U/L (ref 0–37)
Albumin: 4.5 g/dL (ref 3.5–5.2)
Alkaline Phosphatase: 84 U/L (ref 39–117)
BUN: 15 mg/dL (ref 6–23)
CO2: 26 mEq/L (ref 19–32)
Calcium: 9.7 mg/dL (ref 8.4–10.5)
Chloride: 103 mEq/L (ref 96–112)
Creatinine, Ser: 1.1 mg/dL (ref 0.50–1.35)
GFR calc Af Amer: >90 mL/min (ref >90)
GFR calc non Af Amer: 89 mL/min — ABNORMAL LOW (ref >90)
Glucose, Bld: 102 mg/dL — ABNORMAL HIGH (ref 70–99)
Potassium: 3.4 mEq/L — ABNORMAL LOW (ref 3.7–5.3)
Sodium: 143 mEq/L (ref 137–147)
Total Bilirubin: 0.7 mg/dL (ref 0.3–1.2)
Total Protein: 7.2 g/dL (ref 6.0–8.3)

## 2013-12-09 LAB — ETHANOL: Alcohol, Ethyl (B): <11 mg/dL (ref 0–11)

## 2013-12-09 LAB — TROPONIN I

## 2013-12-09 MED ORDER — SODIUM CHLORIDE 0.9 % IV BOLUS (SEPSIS)
1000.0000 mL | Freq: Once | INTRAVENOUS | Status: AC
  Administered 2013-12-09: 1000 mL via INTRAVENOUS

## 2013-12-09 NOTE — ED Notes (Addendum)
Patient states that he was "having weird symptoms" and then Friday night gf states he was having "seizure like"movements. Last night states he fell in the shower, doesn't remember falling, but was alert when girlfriend went in to check on him. States last week he got lightheaded after going outside at work, which can be both indoors and outside. Girlfriend states "he has also had a lot of chest pain and headaches"

## 2013-12-09 NOTE — ED Provider Notes (Signed)
TIME SEEN: 2:27 PM  CHIEF COMPLAINT: Syncope versus seizure  HPI: Patient is a 33 year old male with a history of GERD, erectile dysfunction who presents emergency department with multiple complaints. He states that for the past 2-3 weeks he has had intermittent chest pains mostly at night but only last for several seconds and then resolved. He states he is also had some blurry vision that happens while watching television that also only last for several seconds and resolves with blinking. He states that 4 days ago he had an episode where he bent over and stood up and felt lightheaded and began to sweat. He states this happened after he had been working outside for several hours. He also had a posterior dull headache at that time. He states the main reason he came to the emergency department however is because on Friday morning he had an episode of shaking in bed while he was asleep. He does not remember this event but his girlfriend provides most of the details. She states it lasts approximately 30 seconds and appeared to be generous, chronic seizure. She states she has seen multiple seizures in the past because her sister has a history of epilepsy. She states she was postictal afterwards for approximately 2 hours. He reports he did have some left eye pain and fell he did bite the left side of his tongue. No incontinence. The next day he had an event while he was in the shower where he fell but did not remember the event. There was no seizure-like activity noted at that time. He was not postictal afterwards. He states he still does arm of her falling in the shower but his girlfriend states she found him in the floor of the shower awake but denying falling. Patient states that he is concerned about these episodes because his father had a heart attack at age 47. Patient denies a history of hypertension, diabetes, hyperlipidemia. He does chew tobacco. No history of PE or DVT. No recent prolonged immobilization. No  history of head injury. No recent infectious symptoms including fevers, cough, vomiting or diarrhea. He states he's been eating and drinking well. He is not on anticoagulation.  ROS: See HPI Constitutional: no fever  Eyes: no drainage  ENT: no runny nose   Cardiovascular:  no chest pain  Resp: no SOB  GI: no vomiting GU: no dysuria Integumentary: no rash  Allergy: no hives  Musculoskeletal: no leg swelling  Neurological: no slurred speech ROS otherwise negative  PAST MEDICAL HISTORY/PAST SURGICAL HISTORY:  Past Medical History  Diagnosis Date  . History of chicken pox   . GERD (gastroesophageal reflux disease)   . ED (erectile dysfunction) of non-organic origin 2014    thought psychogenic by urology [name])    MEDICATIONS:  Prior to Admission medications   Medication Sig Start Date End Date Taking? Authorizing Provider  ibuprofen (ADVIL,MOTRIN) 200 MG tablet Take 200 mg by mouth every 6 (six) hours as needed.      Historical Provider, MD  omeprazole (PRILOSEC) 40 MG capsule Take 1 capsule (40 mg total) by mouth daily. 05/04/13   Ria Bush, MD  tadalafil (CIALIS) 20 MG tablet Take 0.5-1 tablets (10-20 mg total) by mouth every other day as needed for erectile dysfunction. 08/30/13   Ria Bush, MD  tadalafil (CIALIS) 5 MG tablet Take 1 tablet (5 mg total) by mouth daily. 08/09/13   Ria Bush, MD    ALLERGIES:  No Known Allergies  SOCIAL HISTORY:  History  Substance Use  Topics  . Smoking status: Never Smoker   . Smokeless tobacco: Current User    Types: Snuff     Comment: occasional  . Alcohol Use: Yes     Comment: occasional    FAMILY HISTORY: Family History  Problem Relation Age of Onset  . Diabetes Father   . Coronary artery disease Father 69    cabg 52, nonsmoker, obese  . Coronary artery disease Paternal Grandfather 85    deceased from same  . Cancer Maternal Grandfather     pancreatic  . Stroke Neg Hx     EXAM: BP 145/78  Pulse 95   Temp(Src) 98.3 F (36.8 C) (Oral)  Resp 16  SpO2 100% CONSTITUTIONAL: Alert and oriented and responds appropriately to questions. Well-appearing; well-nourished on the well-appearing, well-hydrated, nontoxic HEAD: Normocephalic EYES: Conjunctivae clear, PERRL ENT: normal nose; no rhinorrhea; moist mucous membranes; pharynx without lesions noted NECK: Supple, no meningismus, no LAD  CARD: RRR; S1 and S2 appreciated; no murmurs, no clicks, no rubs, no gallops RESP: Normal chest excursion without splinting or tachypnea; breath sounds clear and equal bilaterally; no wheezes, no rhonchi, no rales,  ABD/GI: Normal bowel sounds; non-distended; soft, non-tender, no rebound, no guarding BACK:  The back appears normal and is non-tender to palpation, there is no CVA tenderness EXT: Normal ROM in all joints; non-tender to palpation; no edema; normal capillary refill; no cyanosis    SKIN: Normal color for age and race; warm NEURO: Moves all extremities equally, sensation to light touch intact diffusely, cranial nerves II through XII intact, no dysmetria finger to nose testing, normal gait constant 5/5 in all 4 extremities PSYCH: The patient's mood and manner are appropriate. Grooming and personal hygiene are appropriate.  MEDICAL DECISION MAKING: Patient here with multiple vague symptoms but it appears he did have a likely seizure 2 days ago with tongue biting and postictal period. He may have also had a syncopal event versus another seizure the following day although he was not postictal at that time. He currently has no complaints and is well-appearing, neurologically intact. We'll obtain screening labs, urine, CT head. His EKG shows nonspecific ST changes but no sign of infarct. Have counseled patient on importance of tobacco cessation.  ED PROGRESS: Patient's labs, urine, head CT are completely normal. He still neurologically intact with no complaints. I do not feel he needs to be started on  antiepileptics at this time given it appears he's only had one true seizure. We'll have him followup with neurology as an outpatient. Given [place] and [place] followup information. Have instructed patient that he cannot drive or operate machinery or perform any activity that may be dangerous to himself or others if he were to have another seizure until he is cleared by a neurologist. Have discussed strict return precautions and supportive care instructions. Patient and his girlfriend bedside verbalize understanding and are comfortable with this plan.     EKG Interpretation  Date/Time:  Sunday Dec 09 2013 13:43:39 EDT Ventricular Rate:  94 PR Interval:  140 QRS Duration: 98 QT Interval:  342 QTC Calculation: 427 R Axis:   76 Text Interpretation:  Normal sinus rhythm with sinus arrhythmia ST \\T \ T wave abnormality, consider inferior ischemia Abnormal ECG Confirmed by Kyiah Canepa,  DO, Gianlucas Evenson ([id number]) on 12/09/2013 2:12:11 PM           [place], DO 12/09/13 [id number]

## 2013-12-09 NOTE — Discharge Instructions (Signed)
Driving and Equipment Restrictions Some medical problems make it dangerous to drive, ride a bike, or use machines. Some of these problems are:  A hard blow to the head (concussion).  Passing out (fainting).  Twitching and shaking (seizures).  Low blood sugar.  Taking medicine to help you relax (sedatives).  Taking pain medicines.  Wearing an eye patch.  Wearing splints. This can make it hard to use parts of your body that you need to drive safely. HOME CARE   Do not drive until your doctor says it is okay.  Do not use machines until your doctor says it is okay. You may need a form signed by your doctor (medical release) before you can drive again. You may also need this form before you do other tasks where you need to be fully alert. MAKE SURE YOU:  Understand these instructions.  Will watch your condition.  Will get help right away if you are not doing well or get worse. Document Released: 08/05/2004 Document Revised: 09/20/2011 Document Reviewed: 11/05/2009 [facility] Patient Information 2014 [place].  Seizure, Adult A seizure is abnormal electrical activity in the brain. Seizures usually last from 30 seconds to 2 minutes. There are various types of seizures. Before a seizure, you may have a warning sensation (aura) that a seizure is about to occur. An aura may include the following symptoms:   Fear or anxiety.  Nausea.  Feeling like the room is spinning (vertigo).  Vision changes, such as seeing flashing lights or spots. Common symptoms during a seizure include:  A change in attention or behavior (altered mental status).  Convulsions with rhythmic jerking movements.  Drooling.  Rapid eye movements.  Grunting.  Loss of bladder and bowel control.  Bitter taste in the mouth.  Tongue biting. After a seizure, you may feel confused and sleepy. You may also have an injury resulting from convulsions during the seizure. HOME CARE INSTRUCTIONS   If you  are given medicines, take them exactly as prescribed by your health care provider.  Keep all follow-up appointments as directed by your health care provider.  Do not swim or drive or engage in risky activity during which a seizure could cause further injury to you or others until your health care provider says it is OK.  Get adequate rest.  Teach friends and family what to do if you have a seizure. They should:  Lay you on the ground to prevent a fall.  Put a cushion under your head.  Loosen any tight clothing around your neck.  Turn you on your side. If vomiting occurs, this helps keep your airway clear.  Stay with you until you recover.  Know whether or not you need emergency care. SEEK IMMEDIATE MEDICAL CARE IF:  The seizure lasts longer than 5 minutes.  The seizure is severe or you do not wake up immediately after the seizure.  You have an altered mental status after the seizure.  You are having more frequent or worsening seizures. Someone should drive you to the emergency department or call local emergency services (911 in [place] MAKE SURE YOU:  Understand these instructions.  Will watch your condition.  Will get help right away if you are not doing well or get worse. Document Released: 06/25/2000 Document Revised: 04/18/2013 Document Reviewed: 02/07/2013 [facility] Patient Information 2014 [place].

## 2013-12-13 ENCOUNTER — Ambulatory Visit (INDEPENDENT_AMBULATORY_CARE_PROVIDER_SITE_OTHER): Payer: BC Managed Care – PPO | Admitting: Neurology

## 2013-12-13 ENCOUNTER — Encounter: Payer: Self-pay | Admitting: Neurology

## 2013-12-13 VITALS — BP 138/85 | HR 71 | Ht 72.0 in | Wt 192.0 lb

## 2013-12-13 DIAGNOSIS — R569 Unspecified convulsions: Secondary | ICD-10-CM

## 2013-12-13 DIAGNOSIS — G40909 Epilepsy, unspecified, not intractable, without status epilepticus: Secondary | ICD-10-CM | POA: Insufficient documentation

## 2013-12-13 MED ORDER — DIVALPROEX SODIUM ER 500 MG PO TB24: 500.0000 mg | ORAL_TABLET | Freq: Every day | ORAL | Status: DC

## 2013-12-13 NOTE — Progress Notes (Signed)
PATIENT: Elijah Huang DOB: Feb 24, 1981  HISTORICAL  Elijah Huang is a 33 years old right-handed Caucasian male, accompanied by his girlfriend, referred by his primary care physician Dr. Danise Mina for evaluation of possible seizure  He had past medical history of GERD, erectile dysfunction He presented with seizure-like event, the initial episode was weakness by his girlfriend, Dec 07 2013, after waking up talking with his girlfriend for a while, shortly afterwards, he was noticed to lie on his left side, body shaking, loss of consciousness, lasting for a few minutes, with tongue biting, lip numbness, next day, in May 30th 2015, while taking a shower, his girlfriend heard a loud thundering, he was sitting in a shower, diffuse, did not know what has happened, no self injury,  He denied previous history of seizure, he presented to the emergency room Dec 09 2013, with constellation of complaints, intermittent chest pain, blurry vision, left eye pain, lightheadedness,  He had a CAT scan of the brain that was normal, laboratory showed normal CBC, CMP, negative troponin,   REVIEW OF SYSTEMS: Full 14 system review of systems performed and notable only for fatigue, chest pain, diarrhea, blurry vision, feeling cold, increased thirst, memory loss, confusion, headache, dizziness, seizure  ALLERGIES: No Known Allergies  HOME MEDICATIONS: Current Outpatient Prescriptions on File Prior to Visit  Medication Sig Dispense Refill  . ibuprofen (ADVIL,MOTRIN) 200 MG tablet Take 200 mg by mouth every 6 (six) hours as needed.        Marland Kitchen omeprazole (PRILOSEC) 40 MG capsule Take 1 capsule (40 mg total) by mouth daily.  30 capsule  3  . tadalafil (CIALIS) 20 MG tablet Take 0.5-1 tablets (10-20 mg total) by mouth every other day as needed for erectile dysfunction.  6 tablet  11  . tadalafil (CIALIS) 5 MG tablet Take 1 tablet (5 mg total) by mouth daily.  30 tablet  3   No current facility-administered  medications on file prior to visit.    PAST MEDICAL HISTORY: Past Medical History  Diagnosis Date  . History of chicken pox   . GERD (gastroesophageal reflux disease)   . 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 Tonsillectomy  1989    FAMILY HISTORY: Family History  Problem Relation Age of Onset  . Diabetes Father   . Coronary artery disease Father 28    cabg 35, nonsmoker, obese  . Coronary artery disease Paternal Grandfather 34    deceased from same  . Cancer Maternal Grandfather     pancreatic  . Stroke Neg Hx     SOCIAL HISTORY:  History   Social History  . Marital Status: Married    Spouse Name: N/A    Number of Children: 0  . Years of Education: N/A   Occupational History  . [employer], Librarian, academic    Social History Main Topics  . Smoking status: Never Smoker   . Smokeless tobacco: Current User    Types: Snuff     Comment: occasional  . Alcohol Use: Yes     Comment: occasional  . Drug Use: No  . Sexual Activity: Not Currently   Other Topics Concern  . Not on file   Social History Narrative   Caffeine: 1-2 soft drinks/day   Lives alone, 2 dogs   Occupation: works at [employer]: no regular activity  Diet: good amt water, lots of fried foods, few fruits/vegetables.     PHYSICAL EXAM   Filed Vitals:   12/13/13 1202  BP: 138/85  Pulse: 71  Height: 6' (1.829 m)  Weight: 192 lb (87.091 kg)    Not recorded    Body mass index is 26.03 kg/(m^2).   Generalized: In no acute distress  Neck: Supple, no carotid bruits   Cardiac: Regular rate rhythm  Pulmonary: Clear to auscultation bilaterally  Musculoskeletal: No deformity  Neurological examination  Mentation: Alert oriented to time, place, history taking, and causual conversation  Cranial nerve II-XII: Pupils were equal round  reactive to light. Extraocular movements were full.  Visual field were full on confrontational test. Bilateral fundi were sharp.  Facial sensation and strength were normal. Hearing was intact to finger rubbing bilaterally. Uvula tongue midline.  Head turning and shoulder shrug and were normal and symmetric.Tongue protrusion into cheek strength was normal.  Motor: Normal tone, bulk and strength.  Sensory: Intact to fine touch, pinprick, preserved vibratory sensation, and proprioception at toes.  Coordination: Normal finger to nose, heel-to-shin bilaterally there was no truncal ataxia  Gait: Rising up from seated position without assistance, normal stance, without trunk ataxia, moderate stride, good arm swing, smooth turning, able to perform tiptoe, and heel walking without difficulty.   Romberg signs: Negative  Deep tendon reflexes: Brachioradialis 2/2, biceps 2/2, triceps 2/2, patellar 2/2, Achilles 2/2, plantar responses were flexor bilaterally.   DIAGNOSTIC DATA (LABS, IMAGING, TESTING) - I reviewed patient records, labs, notes, testing and imaging myself where available.  Lab Results  Component Value Date   WBC 5.5 12/09/2013   HGB 15.8 12/09/2013   HCT 44.7 12/09/2013   MCV 83.1 12/09/2013   PLT 273 12/09/2013      Component Value Date/Time   NA 143 12/09/2013 1400   K 3.4* 12/09/2013 1400   CL 103 12/09/2013 1400   CO2 26 12/09/2013 1400   GLUCOSE 102* 12/09/2013 1400   BUN 15 12/09/2013 1400   CREATININE 1.10 12/09/2013 1400   CALCIUM 9.7 12/09/2013 1400   PROT 7.2 12/09/2013 1400   ALBUMIN 4.5 12/09/2013 1400   AST 19 12/09/2013 1400   ALT 22 12/09/2013 1400   ALKPHOS 84 12/09/2013 1400   BILITOT 0.7 12/09/2013 1400   GFRNONAA 89* 12/09/2013 1400   GFRAA >90 12/09/2013 1400   Lab Results  Component Value Date   CHOL 140 05/30/2013   HDL 48.80 05/30/2013   LDLCALC 71 05/30/2013   TRIG 99.0 05/30/2013   CHOLHDL 3 05/30/2013   Lab Results  Component Value Date   TSH 1.42  05/30/2013    ASSESSMENT AND PLAN  Elijah Huang is a 33 y.o. male complains of seizure-like event, normal neurological examination, CAT scan of the brain   Proceed with MRI of the brain with and without contrast, EEG, He has recurrent episode, will start Depakote ER 500 mg every night, Return to clinic in 6 months with Hoyle Sauer, no driving until episode free for 6 months,  Marcial Pacas, M.D. Ph.D.  [facility] [address], [place],  [address] [phone]

## 2013-12-20 ENCOUNTER — Ambulatory Visit (INDEPENDENT_AMBULATORY_CARE_PROVIDER_SITE_OTHER): Payer: BC Managed Care – PPO

## 2013-12-20 ENCOUNTER — Ambulatory Visit (INDEPENDENT_AMBULATORY_CARE_PROVIDER_SITE_OTHER): Payer: BC Managed Care – PPO | Admitting: Radiology

## 2013-12-20 DIAGNOSIS — R569 Unspecified convulsions: Secondary | ICD-10-CM

## 2013-12-24 NOTE — Progress Notes (Signed)
Quick Note:  Shared normal MRI Brain results with patient, he verbalized understanding ______

## 2013-12-28 ENCOUNTER — Telehealth: Payer: Self-pay | Admitting: Neurology

## 2013-12-28 NOTE — Telephone Encounter (Signed)
Pt called would like for someone to call him back concerning the results of his EEG test. Thanks

## 2013-12-28 NOTE — Procedures (Signed)
   HISTORY: 33 years old male, complains of seizure-like events, there was loss of consciousness, and body shaking movement, he did have tongue biting, confusion afterwards  TECHNIQUE:  16 channel EEG was performed based on standard 10-16 international system. One channel was dedicated to EKG, which has demonstrates normal sinus rhythm of 72 beats per minutes.  Upon awakening, the posterior background activity was well-developed, in alpha range, with amplitude of 17 microvoltage, reactive to eye opening and closure.  There was no evidence of epileptiform discharge.  Photic stimulation was performed, which induced a symmetric photic driving.  Hyperventilation was performed, there was no abnormality elicit.  Stage II sleep was achieved.  CONCLUSION: This is a  normal awake and asleep EEG.  There is no electrodiagnostic evidence of epileptiform discharge

## 2013-12-28 NOTE — Telephone Encounter (Signed)
Informed patient that results are not ready as of yet, the physician will review and call back with those results

## 2014-01-03 ENCOUNTER — Encounter: Payer: Self-pay | Admitting: Family Medicine

## 2014-01-03 ENCOUNTER — Ambulatory Visit (INDEPENDENT_AMBULATORY_CARE_PROVIDER_SITE_OTHER): Payer: BC Managed Care – PPO | Admitting: Family Medicine

## 2014-01-03 VITALS — BP 136/78 | HR 83 | Temp 98.6°F | Wt 192.2 lb

## 2014-01-03 DIAGNOSIS — J0100 Acute maxillary sinusitis, unspecified: Secondary | ICD-10-CM

## 2014-01-03 DIAGNOSIS — J019 Acute sinusitis, unspecified: Secondary | ICD-10-CM | POA: Insufficient documentation

## 2014-01-03 MED ORDER — AMOXICILLIN-POT CLAVULANATE 875-125 MG PO TABS: 1.0000 | ORAL_TABLET | Freq: Two times a day (BID) | ORAL | Status: AC

## 2014-01-03 MED ORDER — GUAIFENESIN-CODEINE 100-10 MG/5ML PO SYRP
5.0000 mL | ORAL_SOLUTION | Freq: Two times a day (BID) | ORAL | Status: DC | PRN
Start: 2014-01-03 — End: 2014-04-24

## 2014-01-03 NOTE — Patient Instructions (Signed)
You have a sinus infection, possibly viral at this point. If any worsening or not improving in next 1-2 days, fill antibiotic provided today (augmentin prescription). Push fluids and plenty of rest. Nasal saline irrigation or neti pot to help drain sinuses. May use plain guaifenesin with plenty of fluid to help mobilize mucous. Let us know if fever >101.5, trouble opening/closing mouth, difficulty swallowing, or worsening.

## 2014-01-03 NOTE — Assessment & Plan Note (Addendum)
Anticipate acute left maxillary sinusitis of 7-8 day duration, discussed likely viral at this point cheratussin for cough at night time, discussed sedation precautions. WASP script for augmentin provided with reasons to fill. Supportive care as per instructions.

## 2014-01-03 NOTE — Progress Notes (Signed)
BP 136/78  Pulse 83  Temp(Src) 98.6 F (37 C) (Oral)  Wt 192 lb 4 oz (87.204 kg)  SpO2 97%   CC: sinusitis? Subjective:    Patient ID: Elijah Huang, male    DOB: January 11, 1981, 33 y.o.   MRN: [id number]  HPI: Elijah Huang is a 33 y.o. male presenting on 01/03/2014 for Sinusitis   1+ wk h/o cold sxs.  Started with ST, body aches, felt feverish, sinus and nasal congestion, may be spreading down into chest.  Cough productive of yellow/green mucous over last 2-3 days. Mild headaches. + PNdrainage. Some upper jaw pain yesterday. Cough keeping him up at night time.  No abd pain, ear pain.  Self treated with OTC sinus med (decongestant, expectorant and tylenol). No sick contacts . No smokers at home. No h/o asthma  Since last seen here, had seizures s/p eval by ER then Dr. Addison Lank. Started on depakote which pt is tolerating well.  Workup so far unrevealing.  EEG, MRI normal. Wt Readings from Last 3 Encounters:  01/03/14 192 lb 4 oz (87.204 kg)  12/13/13 192 lb (87.091 kg)  08/09/13 185 lb 8 oz (84.142 kg)  Body mass index is 26.07 kg/(m^2).  Relevant past medical, surgical, family and social history reviewed and updated as indicated.  Allergies and medications reviewed and updated. Current Outpatient Prescriptions on File Prior to Visit  Medication Sig  . divalproex (DEPAKOTE ER) 500 MG 24 hr tablet Take 1 tablet (500 mg total) by mouth at bedtime.  Marland Kitchen omeprazole (PRILOSEC) 40 MG capsule Take 1 capsule (40 mg total) by mouth daily.  . tadalafil (CIALIS) 20 MG tablet Take 0.5-1 tablets (10-20 mg total) by mouth every other day as needed for erectile dysfunction.   No current facility-administered medications on file prior to visit.    Review of Systems Per HPI unless specifically indicated above    Objective:    BP 136/78  Pulse 83  Temp(Src) 98.6 F (37 C) (Oral)  Wt 192 lb 4 oz (87.204 kg)  SpO2 97%  Physical Exam  Nursing note and vitals reviewed. Constitutional: He  appears well-developed and well-nourished. No distress.  HENT:  Head: Normocephalic and atraumatic.  Right Ear: Hearing, tympanic membrane, external ear and ear canal normal.  Left Ear: Hearing, tympanic membrane, external ear and ear canal normal.  Nose: Mucosal edema (nasal mucosal erythema) present. No rhinorrhea. Right sinus exhibits no maxillary sinus tenderness and no frontal sinus tenderness. Left sinus exhibits maxillary sinus tenderness. Left sinus exhibits no frontal sinus tenderness.  Mouth/Throat: Uvula is midline, oropharynx is clear and moist and mucous membranes are normal. No oropharyngeal exudate, posterior oropharyngeal edema, posterior oropharyngeal erythema or tonsillar abscesses.  Eyes: Conjunctivae and EOM are normal. Pupils are equal, round, and reactive to light. No scleral icterus.  Neck: Normal range of motion. Neck supple.  Cardiovascular: Normal rate, regular rhythm, normal heart sounds and intact distal pulses.   No murmur heard. Pulmonary/Chest: Effort normal and breath sounds normal. No respiratory distress. He has no wheezes. He has no rales.  Lymphadenopathy:    He has no cervical adenopathy.  Skin: Skin is warm and dry. No rash noted.       Assessment & Plan:   Problem List Items Addressed This Visit   Acute sinusitis - Primary     Anticipate acute left maxillary sinusitis of 7-8 day duration, discussed likely viral at this point cheratussin for cough at night time, discussed sedation precautions. WASP script  for augmentin provided with reasons to fill. Supportive care as per instructions.    Relevant Medications      amoxicillin-clavulanate (AUGMENTIN) tablet 875-125 mg      cheratussin       Follow up plan: Return if symptoms worsen or fail to improve.

## 2014-01-03 NOTE — Progress Notes (Signed)
Pre visit review using our clinic review tool, if applicable. No additional management support is needed unless otherwise documented below in the visit note. 

## 2014-01-14 ENCOUNTER — Telehealth: Payer: Self-pay | Admitting: Neurology

## 2014-01-14 MED ORDER — DIVALPROEX SODIUM ER 500 MG PO TB24: 1000.0000 mg | ORAL_TABLET | Freq: Every day | ORAL | Status: DC

## 2014-01-14 NOTE — Telephone Encounter (Signed)
Pt called back stating that is has been noticing that he is starring and not responding momentarily and then coming back. Please advise

## 2014-01-14 NOTE — Telephone Encounter (Addendum)
I have talked with Elijah Huang, that he is taking Depakote ER 500mg  po qhs, he still has staring spells, since he started Depakote in June 4th, he has totally 4-5 episodes, last one was July 3rd, he was sitting down, with 3-4 people, when he came around, he was confused.  MRI brain, EEG were normal.  Increase Depakote ER to 500mg  2 tabs qhs.  Hinton Dyer, Please move up his appt with Hoyle Sauer in 3-4 weeks.

## 2014-01-14 NOTE — Telephone Encounter (Signed)
Pt is calling requesting EEG results. Please advise.

## 2014-01-14 NOTE — Addendum Note (Signed)
Addended by: Marcial Pacas on: 01/14/2014 12:57 PM   Modules accepted: Orders

## 2014-01-14 NOTE — Telephone Encounter (Signed)
Patient wanting to know results of recent testing. Please call to advise.

## 2014-01-16 ENCOUNTER — Telehealth: Payer: Self-pay | Admitting: Neurology

## 2014-01-16 ENCOUNTER — Encounter: Payer: Self-pay | Admitting: Neurology

## 2014-01-16 NOTE — Telephone Encounter (Signed)
Patient has an appointment 02-07-14.  Patient needs a note for work stating what he is able to do at work.  Does he need a sooner appointment?  Please call patient and advise--thank you.

## 2014-01-16 NOTE — Telephone Encounter (Signed)
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

## 2014-01-16 NOTE — Telephone Encounter (Signed)
Called Elijah Huang and Elijah Huang stated that he needed Dr. Krista Blue to write a note stating what Elijah Huang can do and cannot do and releasing him to go back to work. I informed the Elijah Huang that Dr. Krista Blue doesn't know what the Elijah Huang job consist of and I asked the Elijah Huang if he could have his job's HR dept fax over to [place] a job description and it would be given to Dr. Krista Blue to review. I advised the Elijah Huang that if he has any other problems, questions or concerns to call the office. Elijah Huang verbalized understanding. FYI

## 2014-01-17 ENCOUNTER — Telehealth: Payer: Self-pay | Admitting: Neurology

## 2014-01-17 NOTE — Telephone Encounter (Signed)
Called pt to inform him per Dr. Krista Blue that the doctor had wrote the pt a note to return to work with limitations and that I had faxed a copy to the pt's HR dept and left a copy up at the front desk for pt to pick up. I advised the pt that if he has any other problems, questions or concerns to call the office. Pt verbalized understanding.

## 2014-01-31 ENCOUNTER — Telehealth: Payer: Self-pay | Admitting: *Deleted

## 2014-01-31 NOTE — Telephone Encounter (Signed)
Called patient to r/s appointment for 7/30, patient was r/s to 02/01/14 at 3:30 pm, patient states that he may have to call back to r/s appointment if his job will not let him leave work early, informed patient that if he needs to r/s this appointment to call back before 3 pm today to avoid "no show" status, patient verbalized understanding.

## 2014-02-01 ENCOUNTER — Telehealth: Payer: Self-pay | Admitting: Nurse Practitioner

## 2014-02-01 ENCOUNTER — Ambulatory Visit: Payer: Self-pay | Admitting: Nurse Practitioner

## 2014-02-01 NOTE — Telephone Encounter (Signed)
No show for scheduled appt 

## 2014-02-07 ENCOUNTER — Ambulatory Visit: Payer: Self-pay | Admitting: Nurse Practitioner

## 2014-04-05 ENCOUNTER — Telehealth: Payer: Self-pay | Admitting: Nurse Practitioner

## 2014-04-05 MED ORDER — DIVALPROEX SODIUM ER 500 MG PO TB24: ORAL_TABLET | ORAL | Status: DC

## 2014-04-05 NOTE — Telephone Encounter (Signed)
Called and spoke to patient He will follow up with Dr.Yan Hoyle Sauer had no openings.

## 2014-04-05 NOTE — Telephone Encounter (Signed)
Spoke with patient, he stated that the episode lasted for about 2 minutes, and when he came back he realized that his arms and hands were shaking, patient wanted to know if he should be seen earlier than his appointment in Dec. Please Advise

## 2014-04-05 NOTE — Telephone Encounter (Signed)
I have called him, he had one episode yesterday pm, he was standing there unresponsive, arms and hands were shaking, he was mildly confused, but could recall some, not all of the detail.  Previous MRI brain, EEG was normal.   He is taking Depakote ER 500mg  2 tabs qhs. I have advised him to increase Depakote one tablet in the morning, 2 tablets every night, prepared trough level when he comes back for a revisit  Hinton Dyer, give him a follow up visit with Hoyle Sauer after Oct 5th.

## 2014-04-05 NOTE — Telephone Encounter (Signed)
Patient calling to state that he had an episode yesterday where he was staring off into space, wants to know if he needs to be seen, please return call and advise.

## 2014-04-17 ENCOUNTER — Ambulatory Visit: Payer: Self-pay | Admitting: Neurology

## 2014-04-24 ENCOUNTER — Encounter: Payer: Self-pay | Admitting: Neurology

## 2014-04-24 ENCOUNTER — Ambulatory Visit (INDEPENDENT_AMBULATORY_CARE_PROVIDER_SITE_OTHER): Payer: BC Managed Care – PPO | Admitting: Neurology

## 2014-04-24 VITALS — BP 136/76 | HR 89 | Ht 72.0 in | Wt 212.0 lb

## 2014-04-24 DIAGNOSIS — R569 Unspecified convulsions: Secondary | ICD-10-CM

## 2014-04-24 MED ORDER — LEVETIRACETAM 1000 MG PO TABS: ORAL_TABLET | ORAL | Status: DC

## 2014-04-24 NOTE — Progress Notes (Signed)
PATIENT: Elijah Huang DOB: 05/11/1981  HISTORICAL  Elijah Huang is a 33 years old right-handed Caucasian male, accompanied by his girlfriend, referred by his primary care physician Dr. Danise Mina for evaluation of possible seizure, with his girl friend Elijah Huang  He had past medical history of GERD, erectile dysfunction  He presented with seizure-like event, the initial episode was witness by his girlfriend, Elijah Huang 2015, after waking up talking with his girlfriend for a while, shortly afterwards, he was noticed to lie on his left side, body shaking, loss of consciousness, lasting for a few minutes, with tongue biting, lip numbness, next day, in Elijah 30th 2015, while taking a shower, his girlfriend heard a loud thundering, he was sitting in a shower, diffuse, did not know what has happened, no self injury,  He denied previous history of seizure, he presented to the emergency room Elijah 33 2015, with constellation of complaints, intermittent chest pain, blurry vision, left eye pain, lightheadedness,  He had a CAT scan of the brain that was normal, laboratory showed normal CBC, CMP, negative troponin,  He works as a Physiological scientist  UPDATE Oct 14th 2015: While taking titrating dose of Depakote he continued to have recurrent sudden onset of lost consciousness,  MRI of the brain was normal, EEG was normal  June 3rd 2015,, spaced out while talking with his nieced, lasting 2 minutes, no seizure activity  June 10th 2015: at work, become unresponsive,  Depakote ER was increased from 500 mg one tablet a day, to 2 tablets every night  July 2nd 2015, in the car with his girl friend, spaced out, lasting 3-4 minues, staring, no body shaking,  July 3rd 2015: goes into staring, noot responside, when he comes too, his hand tremor.  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 divalproex (DEPAKOTE ER) 500 MG 24 hr tablet One po qam and then 2 tabs po qhs  90 tablet  11  . omeprazole (PRILOSEC) 40 MG capsule Take 1 capsule (40 mg total) by mouth daily.  30 capsule  3  . tadalafil (CIALIS) 20 MG tablet Take 0.5-1 tablets (10-20 mg total) by mouth every other day as needed for erectile dysfunction.  6 tablet  11   No current facility-administered medications on file prior to visit.    PAST MEDICAL HISTORY: Past Medical History  Diagnosis Date  . History of chicken pox   . GERD (gastroesophageal reflux disease)   . 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 Seizure disorder 2015    x2, started on depakote, rec no driving for 6 mo (02/8827, Dr Addison Lank)    PAST SURGICAL HISTORY: Past Surgical History  Procedure Laterality Date  . Tonsillectomy  1989    FAMILY HISTORY: Family History  Problem Relation Age of Onset  . Diabetes Father   . Coronary artery disease Father 4    cabg 68, nonsmoker, obese  . Coronary artery disease Paternal Grandfather 23    deceased  from same  . Cancer Maternal Grandfather     pancreatic  . Stroke Neg Hx     SOCIAL HISTORY:  History   Social History  . Marital Status: Married    Spouse Name: N/A    Number of Children: 0  . Years of Education: N/A   Occupational History  . [employer], Librarian, academic    Social History Main Topics  . Smoking status: Never Smoker   . Smokeless tobacco: Current User    Types: Snuff     Comment: occasional  . Alcohol Use: Yes     Comment: occasional  . Drug Use: No  . Sexual Activity: Not  Currently   Other Topics Concern  . Not on file   Social History Narrative   Caffeine: 1-2 soft drinks/day   Lives alone, 2 dogs   Occupation: works at [employer]   Activity: no regular activity   Diet: good amt water, lots of fried foods, few fruits/vegetables.     PHYSICAL EXAM   Filed Vitals:   04/24/14 1114  BP: 136/76  Pulse: 89  Height: 6' (1.829 m)  Weight: 212 lb (96.163 kg)    Not recorded    Body mass index is 28.75 kg/(m^2).   Generalized: In no acute distress  Neck: Supple, no carotid bruits   Cardiac: Regular rate rhythm  Pulmonary: Clear to auscultation bilaterally  Musculoskeletal: No deformity  Neurological examination  Mentation: Alert oriented to time, place, history taking, and causual conversation  Cranial nerve II-XII: Pupils were equal round reactive to light. Extraocular movements were full.  Visual field were full on confrontational test. Bilateral fundi were sharp.  Facial sensation and strength were normal. Hearing was intact to finger rubbing bilaterally. Uvula tongue midline.  Head turning and shoulder shrug and were normal and symmetric.Tongue protrusion into cheek strength was normal.  Motor: Normal tone, bulk and strength.  Sensory: Intact to fine touch, pinprick, preserved vibratory sensation, and proprioception at toes.  Coordination: Normal finger to nose, heel-to-shin bilaterally there was no truncal ataxia  Gait: Rising up from seated position without assistance, normal stance, without trunk ataxia, moderate stride, good arm swing, smooth turning, able to perform tiptoe, and heel walking without difficulty.   Romberg signs: Negative  Deep tendon reflexes: Brachioradialis 2/2, biceps 2/2, triceps 2/2, patellar 2/2, Achilles 2/2, plantar responses were flexor bilaterally.   DIAGNOSTIC DATA (LABS, IMAGING, TESTING) - I reviewed patient records, labs, notes, testing and imaging myself where available.  Lab Results    Component Value Date   WBC 5.5 12/09/2013   HGB 15.8 12/09/2013   HCT 44.7 12/09/2013   MCV 83.1 12/09/2013   PLT 273 12/09/2013      Component Value Date/Time   NA 143 12/09/2013 1400   K 3.4* 12/09/2013 1400   CL 103 12/09/2013 1400   CO2 26 12/09/2013 1400   GLUCOSE 102* 12/09/2013 1400   BUN 15 12/09/2013 1400   CREATININE 1.10 12/09/2013 1400   CALCIUM 9.7 12/09/2013 1400   PROT 7.2 12/09/2013 1400   ALBUMIN 4.5 12/09/2013 1400   AST 19 12/09/2013 1400   ALT 22 12/09/2013 1400   ALKPHOS 84 12/09/2013 1400   BILITOT 0.7 12/09/2013 1400   GFRNONAA 89* 12/09/2013 1400   GFRAA >90 12/09/2013 1400   Lab Results  Component Value Date   CHOL 140 05/30/2013   HDL 48.80 05/30/2013   LDLCALC 71 05/30/2013   TRIG 99.0 05/30/2013   CHOLHDL 3 05/30/2013   Lab  Results  Component Value Date   TSH 1.42 05/30/2013    ASSESSMENT AND PLAN  MAISON KESTENBAUM is a 33 y.o. 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 Ph.D.  [facility] [address] [place], [place] [address] [phone]

## 2014-04-24 NOTE — Patient Instructions (Signed)
Tapering off Depakote, you are taking Depakote ER 500 mg 1 in the morning, 2 tablets every night  First week: 0/2 The second week: 0/1 Third week: 0/0

## 2014-04-25 ENCOUNTER — Telehealth: Payer: Self-pay | Admitting: *Deleted

## 2014-04-25 NOTE — Telephone Encounter (Signed)
Patient calling to r/s appointment with Dr. Krista Blue, patient was r/s to 11/18 at 12 noon, appointment was ok 'd by DC

## 2014-05-08 ENCOUNTER — Ambulatory Visit (INDEPENDENT_AMBULATORY_CARE_PROVIDER_SITE_OTHER): Payer: BC Managed Care – PPO | Admitting: Radiology

## 2014-05-08 DIAGNOSIS — R569 Unspecified convulsions: Secondary | ICD-10-CM

## 2014-05-10 NOTE — Procedures (Signed)
   HISTORY: 33 years old male, with probable partial seizure on Dec 07 2013,  TECHNIQUE:  16 channel EEG was performed based on standard 10-16 international system. One channel was dedicated to EKG, which has demonstrates normal sinus rhythm of 72 beats per minutes.  Upon awakening, the posterior background activity was well-developed, in alpha range, reactive to eye opening and closure.  There was no evidence of epileptiform discharge.  Photic stimulation was performed, which induced a symmetric photic driving.  Hyperventilation was performed, there was no abnormality elicit.  Patient was drowsy, but no deeper stage of sleep was achieved.  CONCLUSION: This is a  normal awake EEG.  There is no electrodiagnostic evidence of epileptiform discharge

## 2014-05-14 ENCOUNTER — Other Ambulatory Visit: Payer: Self-pay | Admitting: Family Medicine

## 2014-05-28 ENCOUNTER — Ambulatory Visit: Payer: BC Managed Care – PPO | Admitting: Neurology

## 2014-05-29 ENCOUNTER — Encounter: Payer: Self-pay | Admitting: Neurology

## 2014-05-29 ENCOUNTER — Ambulatory Visit (INDEPENDENT_AMBULATORY_CARE_PROVIDER_SITE_OTHER): Payer: BC Managed Care – PPO | Admitting: Neurology

## 2014-05-29 VITALS — BP 123/79 | HR 78 | Ht 72.0 in | Wt 211.0 lb

## 2014-05-29 DIAGNOSIS — G40909 Epilepsy, unspecified, not intractable, without status epilepticus: Secondary | ICD-10-CM

## 2014-05-29 MED ORDER — LEVETIRACETAM 1000 MG PO TABS: ORAL_TABLET | ORAL | Status: DC

## 2014-05-29 NOTE — Progress Notes (Signed)
PATIENT: Elijah Huang DOB: 05-25-1981  HISTORICAL  Elijah Huang is a 33 years old right-handed Caucasian male, accompanied by his girlfriend, referred by his primary care physician Dr. Danise Mina for evaluation of possible seizure, with his girl friend April  He had past medical history of GERD, erectile dysfunction  He presented with seizure-like event, the initial episode was witness by his girlfriend, Dec 07 2013, after waking up talking with his girlfriend for a while, shortly afterwards, he was noticed to lie on his left side, body shaking, loss of consciousness, lasting for a few minutes, with tongue biting, lip numbness, next day, in May 30th 2015, while taking a shower, his girlfriend heard a loud thundering, he was sitting in a shower, diffuse, did not know what has happened, no self injury,  He denied previous history of seizure, he presented to the emergency room Dec 09 2013, with constellation of complaints, intermittent chest pain, blurry vision, left eye pain, lightheadedness,  He had a CAT scan of the brain that was normal, laboratory showed normal CBC, CMP, negative troponin,  He works as a Physiological scientist  UPDATE Oct 14th 2015: While taking titrating dose of Depakote he continued to have recurrent sudden onset of lost consciousness,  MRI of the brain was normal, EEG was normal  June 3rd 2015,, spaced out while talking with his nieced, lasting 2 minutes, no seizure activity  June 10th 2015: at work, become unresponsive,  Depakote ER was increased from 500 mg one tablet a day, to 2 tablets every night  July 2nd 2015, in the car with his girl friend, spaced out, lasting 3-4 minues, staring, no body shaking,  July 3rd 2015: goes into staring, noot responside, when he comes too, his hand tremor.  Sept 24th 2015:  At work, he became unresponsive to his manager, staring, hand tremor, mild urinary incontinence  He has no collection of hte events, no warning signs,    He noticed side effects on Depakote this includes weight gain, increased bilateral hands tremor   UPDATE Nov 18th 2015: He is tolerating Keppra 1000 mg twice a day better, He has much less side effect,  He only has one episode of possible seizure, May 22 2014, he fell out of the bed chipped his front teeth, there was no seizure witnessed   REVIEW OF SYSTEMS: Full 14 system review of systems performed and notable only for as above ALLERGIES: No Known Allergies  HOME MEDICATIONS: Current Outpatient Prescriptions on File Prior to Visit  Medication Sig Dispense Refill  . levETIRAcetam (KEPPRA) 1000 MG tablet 1/2 tab po bid xone week, then one tab po bid 60 tablet 11  . omeprazole (PRILOSEC) 40 MG capsule take 1 capsule by mouth once daily 30 capsule 1  . tadalafil (CIALIS) 20 MG tablet Take 0.5-1 tablets (10-20 mg total) by mouth every other day as needed for erectile dysfunction. 6 tablet 11   No current facility-administered medications on file prior to visit.    PAST MEDICAL HISTORY: Past Medical History  Diagnosis Date  . History of chicken pox   . GERD (gastroesophageal reflux disease)   . 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 Seizure disorder 2015    x2, started on depakote, rec no driving for 6 mo (01/5448, Dr Addison Lank)    PAST SURGICAL HISTORY: Past Surgical History  Procedure Laterality Date  . Tonsillectomy  1989    FAMILY HISTORY:  Family History  Problem Relation Age of Onset  . Diabetes Father   . Coronary artery disease Father 88    cabg 34, nonsmoker, obese  . Coronary artery disease Paternal Grandfather 26    deceased from same  . Cancer Maternal Grandfather     pancreatic  . Stroke Neg Hx     SOCIAL HISTORY:  History   Social History  . Marital Status: Married    Spouse Name: N/A    Number of Children: 0  . Years of Education: N/A   Occupational History  . [employer],  Librarian, academic    Social History Main Topics  . Smoking status: Never Smoker   . Smokeless tobacco: Current User    Types: Snuff     Comment: occasional  . Alcohol Use: Yes     Comment: occasional  . Drug Use: No  . Sexual Activity: Not Currently   Other Topics Concern  . Not on file   Social History Narrative   Caffeine: 1-2 soft drinks/day   Lives alone, 2 dogs   Occupation: works at [employer]   Activity: no regular activity   Diet: good amt water, lots of fried foods, few fruits/vegetables.     PHYSICAL EXAM   Filed Vitals:   05/29/14 1212  BP: 123/79  Pulse: 78  Height: 6' (1.829 m)  Weight: 211 lb (95.709 kg)    Not recorded      Body mass index is 28.61 kg/(m^2).   Generalized: In no acute distress  Neck: Supple, no carotid bruits   Cardiac: Regular rate rhythm  Pulmonary: Clear to auscultation bilaterally  Musculoskeletal: No deformity  Neurological examination  Mentation: Alert oriented to time, place, history taking, and causual conversation  Cranial nerve II-XII: Pupils were equal round reactive to light. Extraocular movements were full.  Visual field were full on confrontational test. Bilateral fundi were sharp.  Facial sensation and strength were normal. Hearing was intact to finger rubbing bilaterally. Uvula tongue midline.  Head turning and shoulder shrug and were normal and symmetric.Tongue protrusion into cheek strength was normal.  Motor: Normal tone, bulk and strength.  Sensory: Intact to fine touch, pinprick, preserved vibratory sensation, and proprioception at toes.  Coordination: Normal finger to nose, heel-to-shin bilaterally there was no truncal ataxia  Gait: Rising up from seated position without assistance, normal stance, without trunk ataxia, moderate stride, good arm swing, smooth turning, able to perform tiptoe, and heel walking without difficulty.   Romberg signs: Negative  Deep tendon reflexes: Brachioradialis 2/2,  biceps 2/2, triceps 2/2, patellar 2/2, Achilles 2/2, plantar responses were flexor bilaterally.   DIAGNOSTIC DATA (LABS, IMAGING, TESTING) - I reviewed patient records, labs, notes, testing and imaging myself where available.  Lab Results  Component Value Date   WBC 5.5 12/09/2013   HGB 15.8 12/09/2013   HCT 44.7 12/09/2013   MCV 83.1 12/09/2013   PLT 273 12/09/2013      Component Value Date/Time   NA 143 12/09/2013 1400   K 3.4* 12/09/2013 1400   CL 103 12/09/2013 1400   CO2 26 12/09/2013 1400   GLUCOSE 102* 12/09/2013 1400   BUN 15 12/09/2013 1400   CREATININE 1.10 12/09/2013 1400   CALCIUM 9.7 12/09/2013 1400   PROT 7.2 12/09/2013 1400   ALBUMIN 4.5 12/09/2013 1400   AST 19 12/09/2013 1400   ALT 22 12/09/2013 1400   ALKPHOS 84 12/09/2013 1400   BILITOT 0.7 12/09/2013 1400   GFRNONAA 89* 12/09/2013 1400  GFRAA >90 12/09/2013 1400   Lab Results  Component Value Date   CHOL 140 05/30/2013   HDL 48.80 05/30/2013   LDLCALC 71 05/30/2013   TRIG 99.0 05/30/2013   CHOLHDL 3 05/30/2013   Lab Results  Component Value Date   TSH 1.42 05/30/2013    ASSESSMENT AND PLAN  EPHREM CARRICK is a 33 y.o. male with probable partial seizure seizure, normal neurological examination, CAT scan of the brain, MRI of the brain, EEG   1, increase Keppra to 1000 mg one and half tablets twice a day 2, document all event 3, no driving on to seizure-free for 6 months 4. Return to clinic in 3-4 months  Marcial Pacas, M.D. Ph.D.  [facility] [address] [place], [place] [address] [phone]

## 2014-06-20 ENCOUNTER — Ambulatory Visit: Payer: BC Managed Care – PPO | Admitting: Nurse Practitioner

## 2014-06-24 ENCOUNTER — Telehealth: Payer: Self-pay | Admitting: Neurology

## 2014-08-28 ENCOUNTER — Encounter: Payer: Self-pay | Admitting: *Deleted

## 2014-08-28 ENCOUNTER — Ambulatory Visit (INDEPENDENT_AMBULATORY_CARE_PROVIDER_SITE_OTHER): Payer: BLUE CROSS/BLUE SHIELD | Admitting: Neurology

## 2014-08-28 ENCOUNTER — Encounter: Payer: Self-pay | Admitting: Neurology

## 2014-08-28 VITALS — BP 132/94 | HR 89 | Ht 72.0 in | Wt 212.0 lb

## 2014-08-28 DIAGNOSIS — G40909 Epilepsy, unspecified, not intractable, without status epilepticus: Secondary | ICD-10-CM

## 2014-08-28 DIAGNOSIS — G43009 Migraine without aura, not intractable, without status migrainosus: Secondary | ICD-10-CM

## 2014-08-28 MED ORDER — RIZATRIPTAN BENZOATE 5 MG PO TBDP: 5.0000 mg | ORAL_TABLET | ORAL | Status: DC | PRN

## 2014-08-28 MED ORDER — TOPIRAMATE 50 MG PO TABS: ORAL_TABLET | ORAL | Status: DC

## 2014-08-28 NOTE — Progress Notes (Signed)
PATIENT: IRMA DELANCEY DOB: 1980/11/26  HISTORICAL  KEANU LESNIAK is a 34 years old right-handed Caucasian male, accompanied by his girlfriend, referred by his primary care physician Dr. Danise Mina for evaluation of possible seizure, with his girl friend April  He had past medical history of GERD, erectile dysfunction  He presented with seizure-like event, the initial episode was witness by his girlfriend, Dec 07 2013, after waking up talking with his girlfriend for a while, shortly afterwards, he was noticed to lie on his left side, body shaking, loss of consciousness, lasting for a few minutes, with tongue biting, lip numbness, next day, in May 30th 2015, while taking a shower, his girlfriend heard a loud thundering, he was sitting in the shower, confused, did not know what has happened, no self injury,  He denied previous history of seizure, he presented to the emergency room Dec 09 2013, with constellation of complaints, intermittent chest pain, blurry vision, left eye pain, lightheadedness,  He had a CAT scan of the brain that was normal, laboratory showed normal CBC, CMP, negative troponin,  He works as a Physiological scientist  UPDATE Oct 14th 2015: While taking titrating dose of Depakote he continued to have recurrent sudden onset of lost consciousness,  MRI of the brain was normal, EEG was normal  June 3rd 2015,, spaced out while talking with his nieced, lasting 2 minutes, no seizure activity  June 10th 2015: at work, become unresponsive,  Depakote ER was increased from 500 mg one tablet a day, to 2 tablets every night  July 2nd 2015, in the car with his girl friend, spaced out, lasting 3-4 minues, staring, no body shaking,  July 3rd 2015: goes into staring, noot responside, when he comes too, his hand tremor.  Sept 24th 2015:  At work, he became unresponsive to his manager, staring, hand tremor, mild urinary incontinence  He has no collection of hte events, no warning signs,     He noticed side effects on Depakote this includes weight gain, increased bilateral hands tremor   UPDATE Nov 18th 2015: He is tolerating Keppra 1000 mg twice a day better, He has much less side effect,  He only has one episode of possible seizure, May 22 2014, he fell out of the bed, chipped  his front teeth, there was no seizure witnessed  UPDATE Feb 17th 2016:  He is overall doing very well, taking Keppra 1500 mg twice a day, mild moodiness,  He has episodes of sound sleep,head hang off the bed, difficulty to be awakened, there was no seizure-like activity, couple times each week He also complains of frequent headaches, left parietal region throbbing pounding headaches, with associated light noise sensitivity, lasting for a few minutes to hours, he has been taking frequent Tylenol, he has headaches 5 days out of a week, He also complains of episodes of waking up from sleep, hearing people screaming,  Depakote works for his headache in the past, but causing weight gain, tremor,  REVIEW OF SYSTEMS: Full 14 system review of systems performed and notable only for as above ALLERGIES: No Known Allergies  HOME MEDICATIONS: Current Outpatient Prescriptions on File Prior to Visit  Medication Sig Dispense Refill  . levETIRAcetam (KEPPRA) 1000 MG tablet One and 1/2 tab po bid 90 tablet 11  . omeprazole (PRILOSEC) 40 MG capsule take 1 capsule by mouth once daily 30 capsule 1  . tadalafil (CIALIS) 20 MG tablet Take 0.5-1 tablets (10-20 mg total) by mouth every other day  as needed for erectile dysfunction. 6 tablet 11   No current facility-administered medications on file prior to visit.    PAST MEDICAL HISTORY: Past Medical History  Diagnosis Date  . History of chicken pox   . GERD (gastroesophageal reflux disease)   . 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 Seizure disorder 2015    presumed partial seizures, started on  depakote, rec no driving for 6 mo (10/5623, Dr Addison Lank)    PAST SURGICAL HISTORY: Past Surgical History  Procedure Laterality Date  . Tonsillectomy  1989    FAMILY HISTORY: Family History  Problem Relation Age of Onset  . Diabetes Father   . Coronary artery disease Father 18    cabg 63, nonsmoker, obese  . Coronary artery disease Paternal Grandfather 41    deceased from same  . Cancer Maternal Grandfather     pancreatic  . Stroke Neg Hx     SOCIAL HISTORY:  History   Social History  . Marital Status: Married    Spouse Name: N/A    Number of Children: 0  . Years of Education: N/A   Occupational History  . [employer], Librarian, academic    Social History Main Topics  . Smoking status: Never Smoker   . Smokeless tobacco: Current User    Types: Snuff     Comment: occasional  . Alcohol Use: Yes     Comment: occasional  . Drug Use: No  . Sexual Activity: Not Currently   Other Topics Concern  . Not on file   Social History Narrative   Caffeine: 1-2 soft drinks/day   Lives alone, 2 dogs   Occupation: works at [employer]   Activity: no regular activity   Diet: good amt water, lots of fried foods, few fruits/vegetables.     PHYSICAL EXAM   Filed Vitals:   08/28/14 1118  BP: 132/94  Pulse: 89  Height: 6' (1.829 m)  Weight: 212 lb (96.163 kg)    Not recorded      Body mass index is 28.75 kg/(m^2).   Generalized: In no acute distress  Neck: Supple, no carotid bruits   Cardiac: Regular rate rhythm  Pulmonary: Clear to auscultation bilaterally  Musculoskeletal: No deformity  Neurological examination  Mentation: Alert oriented to time, place, history taking, and causual conversation  Cranial nerve II-XII: Pupils were equal round reactive to light. Extraocular movements were full.  Visual field were full on confrontational test. Bilateral fundi were sharp.  Facial sensation and strength were normal. Hearing was intact to finger rubbing  bilaterally. Uvula tongue midline.  Head turning and shoulder shrug and were normal and symmetric.Tongue protrusion into cheek strength was normal.  Motor: Normal tone, bulk and strength.  Sensory: Intact to fine touch, pinprick, preserved vibratory sensation, and proprioception at toes.  Coordination: Normal finger to nose, heel-to-shin bilaterally there was no truncal ataxia  Gait: Rising up from seated position without assistance, normal stance, without trunk ataxia, moderate stride, good arm swing, smooth turning, able to perform tiptoe, and heel walking without difficulty.   Romberg signs: Negative  Deep tendon reflexes: Brachioradialis 2/2, biceps 2/2, triceps 2/2, patellar 2/2, Achilles 2/2, plantar responses were flexor bilaterally.   DIAGNOSTIC DATA (LABS, IMAGING, TESTING) - I reviewed patient records, labs, notes, testing and imaging myself where available.  Lab Results  Component Value Date   WBC 5.5 12/09/2013   HGB 15.8 12/09/2013   HCT 44.7 12/09/2013  MCV 83.1 12/09/2013   PLT 273 12/09/2013      Component Value Date/Time   NA 143 12/09/2013 1400   K 3.4* 12/09/2013 1400   CL 103 12/09/2013 1400   CO2 26 12/09/2013 1400   GLUCOSE 102* 12/09/2013 1400   BUN 15 12/09/2013 1400   CREATININE 1.10 12/09/2013 1400   CALCIUM 9.7 12/09/2013 1400   PROT 7.2 12/09/2013 1400   ALBUMIN 4.5 12/09/2013 1400   AST 19 12/09/2013 1400   ALT 22 12/09/2013 1400   ALKPHOS 84 12/09/2013 1400   BILITOT 0.7 12/09/2013 1400   GFRNONAA 89* 12/09/2013 1400   GFRAA >90 12/09/2013 1400   Lab Results  Component Value Date   CHOL 140 05/30/2013   HDL 48.80 05/30/2013   LDLCALC 71 05/30/2013   TRIG 99.0 05/30/2013   CHOLHDL 3 05/30/2013   Lab Results  Component Value Date   TSH 1.42 05/30/2013    ASSESSMENT AND PLAN  GIAVONNI FONDER is a 34 y.o. male with probable partial seizure seizure, normal neurological examination, CAT scan of the brain, MRI of the brain, EEG   1,  Keep Keppra to 1500mg  bid 2, add on Topamax, titrating to 100 mg twice a day  3. Maxalt as needed  4, return to clinic in 2 months, if he continued to have episode during sleep, difficulty to be awakened, may even consider video EEG monitoring.    Marcial Pacas, M.D. Ph.D.  [facility] [address], [place], [place] [address] [phone]

## 2014-08-29 ENCOUNTER — Telehealth: Payer: Self-pay | Admitting: *Deleted

## 2014-08-29 ENCOUNTER — Telehealth: Payer: Self-pay | Admitting: Neurology

## 2014-08-29 MED ORDER — TOPIRAMATE 50 MG PO TABS: ORAL_TABLET | ORAL | Status: DC

## 2014-08-29 MED ORDER — RIZATRIPTAN BENZOATE 5 MG PO TBDP: 5.0000 mg | ORAL_TABLET | ORAL | Status: DC | PRN

## 2014-08-29 NOTE — Telephone Encounter (Signed)
Rx's have been re-sent to [facility].

## 2014-08-29 NOTE — Telephone Encounter (Signed)
Attempted call - unable to reach - will try again.

## 2014-08-29 NOTE — Telephone Encounter (Signed)
I have updated his Pharmacy information.  Michelle: we may faxed the letter you generated during his office visit to his HR.

## 2014-08-29 NOTE — Telephone Encounter (Signed)
Asa Lente with [facility] @ [phone], checking status of working restrictions.  Please call and advise.

## 2014-08-29 NOTE — Telephone Encounter (Signed)
Patient calling wanting Topamax and Maxalt sent to [facility]. The Rx was sent to the [facility] instead. Any questions the patient can be reached at  [phone]

## 2014-08-30 ENCOUNTER — Encounter: Payer: Self-pay | Admitting: *Deleted

## 2014-08-30 NOTE — Telephone Encounter (Signed)
Called Elijah Huang - left a detailed message on her personal voicemail - clarified that he is not able to drive for six month after his last seizure that occurred on 05/22/14 - as long as he has no further seizure activity, he will be able to drive again on 5/37/48.  He did not have any other restrictions stated in the letter.

## 2014-09-17 ENCOUNTER — Ambulatory Visit: Payer: Self-pay | Admitting: Family Medicine

## 2014-09-19 ENCOUNTER — Ambulatory Visit (INDEPENDENT_AMBULATORY_CARE_PROVIDER_SITE_OTHER): Payer: BLUE CROSS/BLUE SHIELD | Admitting: Family Medicine

## 2014-09-19 ENCOUNTER — Encounter: Payer: Self-pay | Admitting: Family Medicine

## 2014-09-19 VITALS — BP 118/84 | HR 96 | Temp 98.1°F | Wt 202.5 lb

## 2014-09-19 DIAGNOSIS — K219 Gastro-esophageal reflux disease without esophagitis: Secondary | ICD-10-CM

## 2014-09-19 DIAGNOSIS — G40909 Epilepsy, unspecified, not intractable, without status epilepticus: Secondary | ICD-10-CM

## 2014-09-19 DIAGNOSIS — N529 Male erectile dysfunction, unspecified: Secondary | ICD-10-CM

## 2014-09-19 MED ORDER — TADALAFIL 20 MG PO TABS: 10.0000 mg | ORAL_TABLET | ORAL | Status: DC | PRN

## 2014-09-19 MED ORDER — OMEPRAZOLE 40 MG PO CPDR: 40.0000 mg | DELAYED_RELEASE_CAPSULE | ORAL | Status: DC

## 2014-09-19 NOTE — Assessment & Plan Note (Signed)
Followed by neurology, appreciate their assistance. Still not driving - hopeful for 11/20/2014

## 2014-09-19 NOTE — Assessment & Plan Note (Signed)
cialis effective. Will refill. Coupon provided today.

## 2014-09-19 NOTE — Progress Notes (Signed)
Pre visit review using our clinic review tool, if applicable. No additional management support is needed unless otherwise documented below in the visit note. 

## 2014-09-19 NOTE — Patient Instructions (Addendum)
Omeprazole and cialis refilled  Head of bed elevated. Avoidance of citrus, fatty foods, chocolate, peppermint, and excessive alcohol, along with sodas, orange juice (acidic drinks) At least a few hours between dinner and bed, minimize naps after eating. Good to see you today, call us with questions.

## 2014-09-19 NOTE — Progress Notes (Signed)
   BP 118/84 mmHg  Pulse 96  Temp(Src) 98.1 F (36.7 C) (Oral)  Wt 202 lb 8 oz (91.853 kg)   CC: med refill visit  Subjective:    Patient ID: Elijah Huang, male    DOB: 06-16-1981, 34 y.o.   MRN: [id number]  HPI: Elijah Huang is a 34 y.o. male presenting on 09/19/2014 for Medication Refill   Here for med refill visit.  Partial seizures - followed by neurology. depakote caused bad side effects as well as breakthrough seizures. This was changed to Keppra 1500mg  bid and Topamax 100mg  bid. maxalt under tongue added for headaches that are thought to be seizure related.  GERD - on omeprazole 40mg  every few days to control heartburn. Finds certain triggers (tomato based sauces). Denies nausea/vomiting, dysphagia, early satiety.  ED - cialis effective. Saw urologist - ?psychogenic. We prescribed 5mg  daily but insurance would not cover this.   Weight down 10 lbs - likely from topamax appetite suppressant effect.  Relevant past medical, surgical, family and social history reviewed and updated as indicated. Interim medical history since our last visit reviewed. Allergies and medications reviewed and updated. Current Outpatient Prescriptions on File Prior to Visit  Medication Sig  . levETIRAcetam (KEPPRA) 1000 MG tablet One and 1/2 tab po bid  . rizatriptan (MAXALT-MLT) 5 MG disintegrating tablet Take 1 tablet (5 mg total) by mouth as needed. May repeat in 2 hours if needed  . topiramate (TOPAMAX) 50 MG tablet One po bid x one week, then 2 tabs po bid   No current facility-administered medications on file prior to visit.    Review of Systems Per HPI unless specifically indicated above     Objective:    BP 118/84 mmHg  Pulse 96  Temp(Src) 98.1 F (36.7 C) (Oral)  Wt 202 lb 8 oz (91.853 kg)  Wt Readings from Last 3 Encounters:  09/19/14 202 lb 8 oz (91.853 kg)  08/28/14 212 lb (96.163 kg)  05/29/14 211 lb (95.709 kg)    Physical Exam  Constitutional: He appears well-developed  and well-nourished. No distress.  HENT:  Head: Normocephalic and atraumatic.  Mouth/Throat: Oropharynx is clear and moist. No oropharyngeal exudate.  Eyes: Conjunctivae and EOM are normal. Pupils are equal, round, and reactive to light. No scleral icterus.  Neck: Normal range of motion. Neck supple. Carotid bruit is not present.  Cardiovascular: Normal rate, regular rhythm, normal heart sounds and intact distal pulses.   No murmur heard. Pulmonary/Chest: Breath sounds normal. No respiratory distress. He has no wheezes. He has no rales.  Skin: Skin is warm and dry. No rash noted.  Vitals reviewed.     Assessment & Plan:   Problem List Items Addressed This Visit    Seizure disorder - Primary    Followed by neurology, appreciate their assistance. Still not driving - hopeful for 11/20/2014      GERD (gastroesophageal reflux disease)    Discussed use of med. Continue omeprazole 40mg  MWF. Discussed dietary changes that could help.      Relevant Medications   omeprazole (PRILOSEC) capsule   Erectile dysfunction    cialis effective. Will refill. Coupon provided today.          Follow up plan: Return in about 1 year (around 09/19/2015), or as needed, for follow up visit.

## 2014-09-19 NOTE — Assessment & Plan Note (Signed)
Discussed use of med. Continue omeprazole 40mg  MWF. Discussed dietary changes that could help.

## 2014-10-15 ENCOUNTER — Telehealth: Payer: Self-pay | Admitting: *Deleted

## 2014-10-15 NOTE — Telephone Encounter (Signed)
PA required for Cialis. Completed through [email or web address]. Will await determination.

## 2014-10-21 ENCOUNTER — Emergency Department (HOSPITAL_COMMUNITY)
Admission: EM | Admit: 2014-10-21 | Discharge: 2014-10-21 | Disposition: A | Discharge status: Home / Self Care | Payer: BLUE CROSS/BLUE SHIELD | Attending: Emergency Medicine | Admitting: Emergency Medicine

## 2014-10-21 ENCOUNTER — Telehealth: Payer: Self-pay | Admitting: Family Medicine

## 2014-10-21 ENCOUNTER — Emergency Department (HOSPITAL_COMMUNITY): Payer: BLUE CROSS/BLUE SHIELD

## 2014-10-21 ENCOUNTER — Encounter (HOSPITAL_COMMUNITY): Payer: Self-pay | Admitting: Emergency Medicine

## 2014-10-21 DIAGNOSIS — R911 Solitary pulmonary nodule: Secondary | ICD-10-CM | POA: Insufficient documentation

## 2014-10-21 DIAGNOSIS — N529 Male erectile dysfunction, unspecified: Secondary | ICD-10-CM | POA: Insufficient documentation

## 2014-10-21 DIAGNOSIS — K219 Gastro-esophageal reflux disease without esophagitis: Secondary | ICD-10-CM | POA: Insufficient documentation

## 2014-10-21 DIAGNOSIS — G40909 Epilepsy, unspecified, not intractable, without status epilepticus: Secondary | ICD-10-CM | POA: Diagnosis not present

## 2014-10-21 DIAGNOSIS — R079 Chest pain, unspecified: Secondary | ICD-10-CM | POA: Insufficient documentation

## 2014-10-21 DIAGNOSIS — Z8619 Personal history of other infectious and parasitic diseases: Secondary | ICD-10-CM | POA: Insufficient documentation

## 2014-10-21 DIAGNOSIS — Z79899 Other long term (current) drug therapy: Secondary | ICD-10-CM | POA: Insufficient documentation

## 2014-10-21 LAB — CBC
HCT: 48.2 % (ref 39.0–52.0)
Hemoglobin: 16.6 g/dL (ref 13.0–17.0)
MCH: 28.6 pg (ref 26.0–34.0)
MCHC: 34.4 g/dL (ref 30.0–36.0)
MCV: 83 fL (ref 78.0–100.0)
Platelets: 277 10*3/uL (ref 150–400)
RBC: 5.81 MIL/uL (ref 4.22–5.81)
RDW: 13.2 % (ref 11.5–15.5)
WBC: 4.8 10*3/uL (ref 4.0–10.5)

## 2014-10-21 LAB — BASIC METABOLIC PANEL
Anion gap: 10 (ref 5–15)
BUN: 14 mg/dL (ref 6–23)
CO2: 21 mmol/L (ref 19–32)
Calcium: 9.6 mg/dL (ref 8.4–10.5)
Chloride: 112 mmol/L (ref 96–112)
Creatinine, Ser: 1.23 mg/dL (ref 0.50–1.35)
GFR calc non Af Amer: 77 mL/min — ABNORMAL LOW (ref >90)
GFR, EST AFRICAN AMERICAN: 89 mL/min — AB (ref >90)
Glucose, Bld: 113 mg/dL — ABNORMAL HIGH (ref 70–99)
Potassium: 3.9 mmol/L (ref 3.5–5.1)
SODIUM: 143 mmol/L (ref 135–145)

## 2014-10-21 LAB — I-STAT TROPONIN, ED: Troponin i, poc: 0 ng/mL (ref 0.00–0.08)

## 2014-10-21 LAB — D-DIMER, QUANTITATIVE (NOT AT ARMC): D DIMER QUANT: 0.87 ug{FEU}/mL — AB (ref 0.00–0.48)

## 2014-10-21 MED ORDER — IOHEXOL 350 MG/ML SOLN
80.0000 mL | Freq: Once | INTRAVENOUS | Status: AC | PRN
  Administered 2014-10-21: 80 mL via INTRAVENOUS

## 2014-10-21 NOTE — Discharge Instructions (Signed)
Chest Wall Pain Chest wall pain is pain in or around the bones and muscles of your chest. It may take up to 6 weeks to get better. It may take longer if you must stay physically active in your work and activities.  CAUSES  Chest wall pain may happen on its own. However, it may be caused by:  A viral illness like the flu.  Injury.  Coughing.  Exercise.  Arthritis.  Fibromyalgia.  Shingles. HOME CARE INSTRUCTIONS   Avoid overtiring physical activity. Try not to strain or perform activities that cause pain. This includes any activities using your chest or your abdominal and side muscles, especially if heavy weights are used.  Put ice on the sore area.  Put ice in a plastic bag.  Place a towel between your skin and the bag.  Leave the ice on for 15-20 minutes per hour while awake for the first 2 days.  Only take over-the-counter or prescription medicines for pain, discomfort, or fever as directed by your caregiver. SEEK IMMEDIATE MEDICAL CARE IF:   Your pain increases, or you are very uncomfortable.  You have a fever.  Your chest pain becomes worse.  You have new, unexplained symptoms.  You have nausea or vomiting.  You feel sweaty or lightheaded.  You have a cough with phlegm (sputum), or you cough up blood. MAKE SURE YOU:   Understand these instructions.  Will watch your condition.  Will get help right away if you are not doing well or get worse. Document Released: 06/28/2005 Document Revised: 09/20/2011 Document Reviewed: 02/22/2011 [facility] Patient Information 2015 [place], [place]. This information is not intended to replace advice given to you by your health care provider. Make sure you discuss any questions you have with your health care provider. Pulmonary Nodule A pulmonary nodule is a small, round growth of tissue in the lung. Pulmonary nodules can range in size from less than 1/5 inch (4 mm) to a little bigger than an inch (25 mm). Most pulmonary nodules  are detected when imaging tests of the lung are being performed for a different problem. Pulmonary nodules are usually not cancerous (benign). However, some pulmonary nodules are cancerous (malignant). Follow-up treatment or testing is based on the size of the pulmonary nodule and your risk of getting lung cancer.  CAUSES Benign pulmonary nodules can be caused by various things. Some of the causes include:   Bacterial, fungal, or viral infections. This is usually an old infection that is no longer active, but it can sometimes be a current, active infection.  A benign mass of tissue.  Inflammation from conditions such as rheumatoid arthritis.   Abnormal blood vessels in the lungs. Malignant pulmonary nodules can result from lung cancer or from cancers that spread to the lung from other places in the body. SIGNS AND SYMPTOMS Pulmonary nodules usually do not cause symptoms. DIAGNOSIS Most often, pulmonary nodules are found incidentally when an X-Mccue or CT scan is performed to look for some other problem in the lung area. To help determine whether a pulmonary nodule is benign or malignant, your health care provider will take a medical history and order a variety of tests. Tests done may include:   Blood tests.  A skin test called a tuberculin test. This test is used to determine if you have been exposed to the germ that causes tuberculosis.   Chest X-rays. If possible, a new X-Gavel may be compared with X-rays you have had in the past.   CT scan.  This test shows smaller pulmonary nodules more clearly than an X-Carico.   Positron emission tomography (PET) scan. In this test, a safe amount of a radioactive substance is injected into the bloodstream. Then, the scan takes a picture of the pulmonary nodule. The radioactive substance is eliminated from your body in your urine.   Biopsy. A tiny piece of the pulmonary nodule is removed so it can be checked under a microscope. TREATMENT  Pulmonary  nodules that are benign normally do not require any treatment because they usually do not cause symptoms or breathing problems. Your health care provider may want to monitor the pulmonary nodule through follow-up CT scans. The frequency of these CT scans will vary based on the size of the nodule and the risk factors for lung cancer. For example, CT scans will need to be done more frequently if the pulmonary nodule is larger and if you have a history of smoking and a family history of cancer. Further testing or biopsies may be done if any follow-up CT scan shows that the size of the pulmonary nodule has increased. HOME CARE INSTRUCTIONS  Only take over-the-counter or prescription medicines as directed by your health care provider.  Keep all follow-up appointments with your health care provider. SEEK MEDICAL CARE IF:  You have trouble breathing when you are active.   You feel sick or unusually tired.   You do not feel like eating.   You lose weight without trying to.   You develop chills or night sweats.  SEEK IMMEDIATE MEDICAL CARE IF:  You cannot catch your breath, or you begin wheezing.   You cannot stop coughing.   You cough up blood.   You become dizzy or feel like you are going to pass out.   You have sudden chest pain.   You have a fever or persistent symptoms for more than 2-3 days.   You have a fever and your symptoms suddenly get worse. MAKE SURE YOU:  Understand these instructions.  Will watch your condition.  Will get help right away if you are not doing well or get worse. Document Released: 04/25/2009 Document Revised: 02/28/2013 Document Reviewed: 12/18/2012 [facility] Patient Information 2015 [place], [place]. This information is not intended to replace advice given to you by your health care provider. Make sure you discuss any questions you have with your health care provider.

## 2014-10-21 NOTE — ED Provider Notes (Signed)
CSN: [id number]     Arrival date & time 10/21/14  1000 History   First MD Initiated Contact with Patient 10/21/14 1018     Chief Complaint  Patient presents with  . Chest Pain     (Consider location/radiation/quality/duration/timing/severity/associated sxs/prior Treatment) Patient is a 34 y.o. male presenting with chest pain. The history is provided by the patient. No language interpreter was used.  Chest Pain Pain location:  L chest Pain quality: aching   Pain radiates to:  Does not radiate Pain radiates to the back: no   Pain severity:  Mild Onset quality:  Gradual Timing:  Constant Progression:  Worsening Chronicity:  New Context: no movement   Relieved by:  Nothing Worsened by:  Nothing tried Ineffective treatments:  None tried Associated symptoms: no abdominal pain, no fever and not vomiting   Risk factors: no high cholesterol and no hypertension   Pt complains of pain in his chest.  Pt reports pain began on Saturday after work.   Pt reports pain is constant and mild with intermittant severe pain.  Past Medical History  Diagnosis Date  . History of chicken pox   . GERD (gastroesophageal reflux disease)   . 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 Seizure disorder 2015    presumed partial seizures, started on depakote, rec no driving for 6 mo (11/4268, Dr Addison Lank)   Past Surgical History  Procedure Laterality Date  . Tonsillectomy  1989   Family History  Problem Relation Age of Onset  . Diabetes Father   . Coronary artery disease Father 44    cabg 77, nonsmoker, obese  . Coronary artery disease Paternal Grandfather 36    deceased from same  . Cancer Maternal Grandfather     pancreatic  . Stroke Neg Hx    History  Substance Use Topics  . Smoking status: Never Smoker   . Smokeless tobacco: Current User    Types: Snuff     Comment: occasional  . Alcohol Use: 0.0 oz/week    0 Standard drinks or  equivalent per week     Comment: occasional    Review of Systems  Constitutional: Negative for fever.  Cardiovascular: Positive for chest pain.  Gastrointestinal: Negative for vomiting and abdominal pain.  All other systems reviewed and are negative.     Allergies  Review of patient's allergies indicates no known allergies.  Home Medications   Prior to Admission medications   Medication Sig Start Date End Date Taking? Authorizing Provider  levETIRAcetam (KEPPRA) 1000 MG tablet One and 1/2 tab po bid 05/29/14   Marcial Pacas, MD  omeprazole (PRILOSEC) 40 MG capsule Take 1 capsule (40 mg total) by mouth every Monday, Wednesday, and Friday. 09/19/14   Ria Bush, MD  rizatriptan (MAXALT-MLT) 5 MG disintegrating tablet Take 1 tablet (5 mg total) by mouth as needed. May repeat in 2 hours if needed 08/29/14   Marcial Pacas, MD  tadalafil (CIALIS) 20 MG tablet Take 0.5-1 tablets (10-20 mg total) by mouth every other day as needed for erectile dysfunction. 09/19/14   Ria Bush, MD  topiramate (TOPAMAX) 50 MG tablet One po bid x one week, then 2 tabs po bid 08/29/14   Marcial Pacas, MD   Ht 6' (1.829 m)  Wt 192 lb 6 oz (87.261 kg)  BMI 26.09 kg/m2 Physical Exam  Constitutional: He appears well-developed.  HENT:  Head: Normocephalic.  Right Ear: External ear normal.  Nose: Nose normal.  Eyes: Conjunctivae are normal. Pupils are equal, round, and reactive to light.  Neck: Normal range of motion. Neck supple.  Cardiovascular: Normal rate and normal heart sounds.   Pulmonary/Chest: Effort normal and breath sounds normal.  Tender mid chest,  Slight pain with palpation,  Pain with inspiration  Abdominal: Soft.  Musculoskeletal: Normal range of motion.  Neurological: He is alert.  Skin: Skin is warm.  Psychiatric: He has a normal mood and affect.    ED Course  Procedures (including critical care time) Labs Review Labs Reviewed  CBC  BASIC METABOLIC PANEL  I-STAT TROPOININ, ED    Results for orders placed or performed during the hospital encounter of 10/21/14  CBC  Result Value Ref Range   WBC 4.8 4.0 - 10.5 K/uL   RBC 5.81 4.22 - 5.81 MIL/uL   Hemoglobin 16.6 13.0 - 17.0 g/dL   HCT 48.2 39.0 - 52.0 %   MCV 83.0 78.0 - 100.0 fL   MCH 28.6 26.0 - 34.0 pg   MCHC 34.4 30.0 - 36.0 g/dL   RDW 13.2 11.5 - 15.5 %   Platelets 277 150 - 400 K/uL  Basic metabolic panel  Result Value Ref Range   Sodium 143 135 - 145 mmol/L   Potassium 3.9 3.5 - 5.1 mmol/L   Chloride 112 96 - 112 mmol/L   CO2 21 19 - 32 mmol/L   Glucose, Bld 113 (H) 70 - 99 mg/dL   BUN 14 6 - 23 mg/dL   Creatinine, Ser 1.23 0.50 - 1.35 mg/dL   Calcium 9.6 8.4 - 10.5 mg/dL   GFR calc non Af Amer 77 (L) >90 mL/min   GFR calc Af Amer 89 (L) >90 mL/min   Anion gap 10 5 - 15  D-dimer, quantitative  Result Value Ref Range   D-Dimer, Quant 0.87 (H) 0.00 - 0.48 ug/mL-FEU  I-stat troponin, ED (not at [facility])  Result Value Ref Range   Troponin i, poc 0.00 0.00 - 0.08 ng/mL   Comment 3           Dg Chest 2 View  10/21/2014   CLINICAL DATA:  Chest pain, history of GERD  EXAM: CHEST  2 VIEW  COMPARISON:  None.  FINDINGS: Cardiomediastinal silhouette is unremarkable. No acute infiltrate or pleural effusion. No pulmonary edema. Bony thorax is unremarkable.  IMPRESSION: No active cardiopulmonary disease.   Electronically Signed   By: Lahoma Crocker M.D.   On: 10/21/2014 12:08   Ct Angio Chest Pe W/cm &/or Wo Cm  10/21/2014   CLINICAL DATA:  Chest pain for 2 days  EXAM: CT ANGIOGRAPHY CHEST WITH CONTRAST  TECHNIQUE: Multidetector CT imaging of the chest was performed using the standard protocol during bolus administration of intravenous contrast. Multiplanar CT image reconstructions and MIPs were obtained to evaluate the vascular anatomy.  CONTRAST:  84mL OMNIPAQUE IOHEXOL 350 MG/ML SOLN  COMPARISON:  Chest radiograph October 21, 2014.  FINDINGS: There is no demonstrable pulmonary embolus. There is no thoracic aortic  aneurysm or dissection.  On axial slice 58 series 6, there is a 6 mm nodular opacity in the inferior lingula. There is mild atelectasis in the inferior lingula as well. Lungs elsewhere clear. No edema or consolidation.  There is no appreciable thoracic adenopathy. The pericardium is not thickened. Visualized thyroid is normal.  In the visualized upper abdomen, no lesions are appreciable.  There are no blastic or lytic bone lesions.  Review of the MIP images confirms  the above findings.  IMPRESSION: No demonstrable pulmonary embolus.  6 mm nodular opacity inferior lingula. Followup of this nodular opacity should be based on Fleischner Society guidelines. If the patient is at high risk for bronchogenic carcinoma, follow-up chest CT at 6-12 months is recommended. If the patient is at low risk for bronchogenic carcinoma, follow-up chest CT at 12 months is recommended. This recommendation follows the consensus statement: Guidelines for Management of Small Pulmonary Nodules Detected on CT Scans: A Statement from the [employer] as published in Radiology 2005;237:395-400.  No edema or consolidation. No apparent adenopathy. There is mild atelectasis in the inferior lingula.   Electronically Signed   By: Lowella Grip III M.D.   On: 10/21/2014 13:48    Imaging Review No results found.   EKG Interpretation   Date/Time:  Monday October 21 2014 10:14:47 EDT Ventricular Rate:  108 PR Interval:  130 QRS Duration: 88 QT Interval:  324 QTC Calculation: 434 R Axis:   85 Text Interpretation:  Sinus tachycardia Right atrial enlargement Cannot  rule out Inferior infarct , age undetermined Abnormal ECG Confirmed by  Hazle Coca [phone]) on 10/21/2014 10:17:42 AM      MDM  Pt had elevated ddimer.   Ct chest obtained.  Ct shows 43mm nodule.   Radiologist recommended 6-12 month follow up.  Pt advised/   Pt has normal troponin.   Pt advised to follow up with Dr. Criss Alvine for recheck in 3-4 days.    Final  diagnoses:  Chest pain        Fransico Meadow, PA-C 10/21/14 Axis, MD 10/21/14 [phone]

## 2014-10-21 NOTE — ED Notes (Signed)
PA at bedside.

## 2014-10-21 NOTE — ED Notes (Signed)
Patient transported to CT 

## 2014-10-21 NOTE — Telephone Encounter (Signed)
plz call for update - would offer appt tomorrow if not seen at ER.

## 2014-10-21 NOTE — Telephone Encounter (Signed)
Patient Name: Elijah Huang  DOB: 1980-12-10    Initial Comment Caller states he has been having chest pains since Saturday    Nurse Assessment  Nurse: Leilani Merl, RN, Heather Date/Time (Eastern Time): 10/21/2014 9:10:43 AM  Confirm and document reason for call. If symptomatic, describe symptoms. ---Caller states he has been having chest pains since Saturday, off and on and it is worse today.  Has the patient traveled out of the country within the last 30 days? ---Not Applicable  Does the patient require triage? ---Yes  Related visit to physician within the last 2 weeks? ---No  Does the PT have any chronic conditions? (i.e. diabetes, asthma, etc.) ---Yes  List chronic conditions. ---seizures,     Guidelines    Guideline Title Affirmed Question Affirmed Notes  Chest Pain [1] Intermittent chest pain or "angina" AND [2] increasing in severity or frequency (Exception: pains lasting a few seconds)    Final Disposition User   Go to ED Now [place], RN, Nira Conn    Comments  Not able to find patient in system, verified name and birth date, unable to document call in EPIC.  Found patient in system, record documented in EPIC

## 2014-10-21 NOTE — ED Notes (Signed)
Pt c/o chest pain onset Saturday afternoon after getting off work. No other symptoms with the pain.

## 2014-10-23 NOTE — Telephone Encounter (Signed)
Did go to [facility] ER. ? Pulled muscle. Lung nodule found. Follow up scheduled.

## 2014-10-23 NOTE — Telephone Encounter (Signed)
Approved. Pharmacy notified

## 2014-10-24 ENCOUNTER — Ambulatory Visit (INDEPENDENT_AMBULATORY_CARE_PROVIDER_SITE_OTHER): Payer: BLUE CROSS/BLUE SHIELD | Admitting: Family Medicine

## 2014-10-24 ENCOUNTER — Encounter: Payer: Self-pay | Admitting: Family Medicine

## 2014-10-24 VITALS — BP 110/68 | HR 79 | Temp 98.0°F | Ht 72.0 in | Wt 193.0 lb

## 2014-10-24 DIAGNOSIS — R911 Solitary pulmonary nodule: Secondary | ICD-10-CM

## 2014-10-24 DIAGNOSIS — R0789 Other chest pain: Secondary | ICD-10-CM

## 2014-10-24 NOTE — Assessment & Plan Note (Signed)
ER records reviewed. Anticipate rib cage strain after heavy lifting over the weekend. Continues slowly improving, continue tylenol and heating pad. Declines Rx NSAID or muscle relaxant. Reassured not cardiac cause.

## 2014-10-24 NOTE — Progress Notes (Signed)
BP 110/68 mmHg  Pulse 79  Temp(Src) 98 F (36.7 C) (Oral)  Ht 6' (1.829 m)  Wt 193 lb (87.544 kg)  BMI 26.17 kg/m2  SpO2 99%   CC: ER f/u visit  Subjective:    Patient ID: Elijah Huang, male    DOB: 11-Jul-1981, 34 y.o.   MRN: [id number]  HPI: Elijah Huang is a 34 y.o. male presenting on 10/24/2014 for ER follow up   Seen at ER on 10/21/2014 with chest pain - started Saturday evening after work. Recurred and worsened on Sunday, affected sleep. Called our office Monday and [facility] advised he go to ER.  No fevers/chills, dyspnea, palpitations, wheezing, cough.   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 Wonders about chest wall strain. Pain was reproducible and positional. Not exertional.  Has f/u appt on 4/20 with neurology.  EKG reviewed - sinus tachycardia with nonspecific ST changes lateral leads, normal axis and intervals. CBC, BMP normal, TnI neg x 1, D dimer elevated so CT obtained. Negative for PE but did have 75mm pulm nodule. rec f/u in 6-12 months.   Strong fmhx CAD.  Ct Angio Chest Pe W/cm &/or Wo Cm 10/21/2014 CLINICAL DATA: Chest pain for 2 days EXAM: CT ANGIOGRAPHY CHEST WITH CONTRAST TECHNIQUE: Multidetector CT imaging of the chest was performed using the standard protocol during bolus administration of intravenous contrast. Multiplanar CT image reconstructions and MIPs were obtained to evaluate the vascular anatomy. CONTRAST: 79mL OMNIPAQUE IOHEXOL 350 MG/ML SOLN COMPARISON: Chest radiograph October 21, 2014. FINDINGS: There is no demonstrable pulmonary embolus. There is no thoracic aortic aneurysm or dissection. On axial slice 58 series 6, there is a 6 mm nodular opacity in the inferior lingula. There is mild atelectasis in the inferior lingula as well. Lungs elsewhere clear. No edema or consolidation. There is no appreciable thoracic adenopathy. The pericardium is not thickened. Visualized thyroid is normal. In the visualized upper  abdomen, no lesions are appreciable. There are no blastic or lytic bone lesions. Review of the MIP images confirms the above findings. IMPRESSION: No demonstrable pulmonary embolus. 6 mm nodular opacity inferior lingula. Followup of this nodular opacity should be based on Fleischner Society guidelines. If the patient is at high risk for bronchogenic carcinoma, follow-up chest CT at 6-12 months is recommended. If the patient is at low risk for bronchogenic carcinoma, follow-up chest CT at 12 months is recommended. This recommendation follows the consensus statement: Guidelines for Management of Small Pulmonary Nodules Detected on CT Scans: A Statement from the [place] as published in Radiology 2005;237:395-400. No edema or consolidation. No apparent adenopathy. There is mild atelectasis in the inferior lingula. Electronically Signed By: Lowella Grip III M.D. On: 10/21/2014 13:48  Relevant past medical, surgical, family and social history reviewed and updated as indicated. Interim medical history since our last visit reviewed. Allergies and medications reviewed and updated. Current Outpatient Prescriptions on File Prior to Visit  Medication Sig  . cetirizine (ZYRTEC) 10 MG tablet Take 10 mg by mouth daily as needed.   . levETIRAcetam (KEPPRA) 500 MG tablet Take 1,500 mg by mouth 2 (two) times daily.   Marland Kitchen omeprazole (PRILOSEC) 40 MG capsule Take 1 capsule (40 mg total) by mouth every Monday, Wednesday, and Friday.  . rizatriptan (MAXALT-MLT) 5 MG disintegrating tablet Take 1 tablet (5 mg total) by mouth as needed. May repeat in 2 hours if needed  . tadalafil (CIALIS) 20 MG tablet Take 0.5-1 tablets (10-20 mg total) by  mouth every other day as needed for erectile dysfunction.  . topiramate (TOPAMAX) 50 MG tablet One po bid x one week, then 2 tabs po bid   No current facility-administered medications on file prior to visit.   Family History  Problem Relation Age of Onset  .  Diabetes Father   . Coronary artery disease Father 23    cabg 76, nonsmoker, obese  . Coronary artery disease Paternal Grandfather 99    deceased from same  . Cancer Maternal Grandfather     pancreatic  . Stroke Neg Hx      Review of Systems Per HPI unless specifically indicated above     Objective:    BP 110/68 mmHg  Pulse 79  Temp(Src) 98 F (36.7 C) (Oral)  Ht 6' (1.829 m)  Wt 193 lb (87.544 kg)  BMI 26.17 kg/m2  SpO2 99%  Wt Readings from Last 3 Encounters:  10/24/14 193 lb (87.544 kg)  10/21/14 192 lb 6 oz (87.261 kg)  09/19/14 202 lb 8 oz (91.853 kg)    Physical Exam  Constitutional: He appears well-developed and well-nourished. No distress.  HENT:  Mouth/Throat: Oropharynx is clear and moist. No oropharyngeal exudate.  Eyes: Conjunctivae and EOM are normal. Pupils are equal, round, and reactive to light. No scleral icterus.  Cardiovascular: Normal rate, regular rhythm, normal heart sounds and intact distal pulses.   No murmur heard. Pulmonary/Chest: Effort normal and breath sounds normal. No respiratory distress. He has no wheezes. He has no rales. He exhibits tenderness.  Tender to palpation mid sternum as well as L 3rd/4th costochondral joints and intercostals  Skin: Skin is warm and dry. No rash noted.  Psychiatric: He has a normal mood and affect.  Nursing note and vitals reviewed.  Results for orders placed or performed during the hospital encounter of 10/21/14  CBC  Result Value Ref Range   WBC 4.8 4.0 - 10.5 K/uL   RBC 5.81 4.22 - 5.81 MIL/uL   Hemoglobin 16.6 13.0 - 17.0 g/dL   HCT 48.2 39.0 - 52.0 %   MCV 83.0 78.0 - 100.0 fL   MCH 28.6 26.0 - 34.0 pg   MCHC 34.4 30.0 - 36.0 g/dL   RDW 13.2 11.5 - 15.5 %   Platelets 277 150 - 400 K/uL  Basic metabolic panel  Result Value Ref Range   Sodium 143 135 - 145 mmol/L   Potassium 3.9 3.5 - 5.1 mmol/L   Chloride 112 96 - 112 mmol/L   CO2 21 19 - 32 mmol/L   Glucose, Bld 113 (H) 70 - 99 mg/dL   BUN 14  6 - 23 mg/dL   Creatinine, Ser 1.23 0.50 - 1.35 mg/dL   Calcium 9.6 8.4 - 10.5 mg/dL   GFR calc non Af Amer 77 (L) >90 mL/min   GFR calc Af Amer 89 (L) >90 mL/min   Anion gap 10 5 - 15  D-dimer, quantitative  Result Value Ref Range   D-Dimer, Quant 0.87 (H) 0.00 - 0.48 ug/mL-FEU  I-stat troponin, ED (not at [facility])  Result Value Ref Range   Troponin i, poc 0.00 0.00 - 0.08 ng/mL   Comment 3              Assessment & Plan:   Problem List Items Addressed This Visit    Solitary pulmonary nodule    Discussed dx and option of rpt CT scan in 1 year. Very low risk for lung cancer.      Chest  wall pain - Primary    ER records reviewed. Anticipate rib cage strain after heavy lifting over the weekend. Continues slowly improving, continue tylenol and heating pad. Declines Rx NSAID or muscle relaxant. Reassured not cardiac cause.          Follow up plan: Return if symptoms worsen or fail to improve.

## 2014-10-24 NOTE — Patient Instructions (Signed)
May continue tylenol 500-1000mg  twice daily as needed We will discuss repeat CT scan in 10/2015.

## 2014-10-24 NOTE — Progress Notes (Signed)
Pre visit review using our clinic review tool, if applicable. No additional management support is needed unless otherwise documented below in the visit note. 

## 2014-10-24 NOTE — Assessment & Plan Note (Signed)
Discussed dx and option of rpt CT scan in 1 year. Very low risk for lung cancer.

## 2014-10-28 ENCOUNTER — Other Ambulatory Visit: Payer: Self-pay

## 2014-10-28 MED ORDER — TADALAFIL 20 MG PO TABS: 10.0000 mg | ORAL_TABLET | ORAL | Status: DC | PRN

## 2014-10-28 NOTE — Telephone Encounter (Signed)
Sent in #6.

## 2014-10-28 NOTE — Telephone Encounter (Signed)
Pt left v/m; pt was talking with ins co and pts ins co will allow # 6 tabs per 30 days; pt request new rx for # 6 pills sent to [facility]. Please advise.

## 2014-10-30 ENCOUNTER — Encounter: Payer: Self-pay | Admitting: Neurology

## 2014-10-30 ENCOUNTER — Ambulatory Visit (INDEPENDENT_AMBULATORY_CARE_PROVIDER_SITE_OTHER): Payer: BLUE CROSS/BLUE SHIELD | Admitting: Neurology

## 2014-10-30 VITALS — BP 114/79 | HR 77 | Ht 72.0 in | Wt 193.0 lb

## 2014-10-30 DIAGNOSIS — G40909 Epilepsy, unspecified, not intractable, without status epilepticus: Secondary | ICD-10-CM

## 2014-10-30 DIAGNOSIS — G43009 Migraine without aura, not intractable, without status migrainosus: Secondary | ICD-10-CM | POA: Diagnosis not present

## 2014-10-30 MED ORDER — LEVETIRACETAM 500 MG PO TABS: 1500.0000 mg | ORAL_TABLET | Freq: Two times a day (BID) | ORAL | Status: DC

## 2014-10-30 MED ORDER — TOPIRAMATE 100 MG PO TABS: ORAL_TABLET | ORAL | Status: DC

## 2014-10-30 NOTE — Progress Notes (Signed)
PATIENT: Elijah Huang DOB: 10-Oct-1980  HISTORICAL  Elijah Huang is a 34 years old right-handed Caucasian male, accompanied by his girlfriend, referred by his primary care physician Dr. Danise Mina for evaluation of possible seizure, with his girl friend Elijah Huang  He had past medical history of GERD, erectile dysfunction  He presented with seizure-like event, the initial episode was witness by his girlfriend, Dec 07 2013, after waking up talking with his girlfriend for a while, shortly afterwards, he was noticed to lie on his left side, body shaking, loss of consciousness, lasting for a few minutes, with tongue biting, lip numbness, next day, in May 30th 2015, while taking a shower, his girlfriend heard a loud thundering, he was sitting in the shower, confused, did not know what has happened, no self injury,  He denied previous history of seizure, he presented to the emergency room Dec 09 2013, with constellation of complaints, intermittent chest pain, blurry vision, left eye pain, lightheadedness,  He had a CAT scan of the brain that was normal, laboratory showed normal CBC, CMP, negative troponin,  He works as a Physiological scientist  UPDATE Oct 14th 2015: While taking titrating dose of Depakote he continued to have recurrent sudden onset of lost consciousness,  MRI of the brain was normal, EEG was normal  June 3rd 2015,, spaced out while talking with his nieced, lasting 2 minutes, no seizure activity  June 10th 2015: at work, become unresponsive,  Depakote ER was increased from 500 mg one tablet a day, to 2 tablets every night  July 2nd 2015, in the car with his girl friend, spaced out, lasting 3-4 minues, staring, no body shaking,  July 3rd 2015: goes into staring, noot responside, when he comes too, his hand tremor.  Sept 24th 2015:  At work, he became unresponsive to his manager, staring, hand tremor, mild urinary incontinence  He has no collection of hte events, no warning signs,     He noticed side effects on Depakote this includes weight gain, increased bilateral hands tremor , Depakote was stop in Nov 2015, started Keppra  UPDATE Nov 18th 2015: He is tolerating Keppra 1000 mg twice a day better, He has much less side effect,  He only has one episode of possible seizure, May 22 2014, he fell out of the bed, injured his front teeth, there was no seizure witnessed  UPDATE Feb 17th 2016:  He is overall doing very well, taking Keppra 1500 mg twice a day, mild moodiness,  He has episodes of sound sleep,head hang off the bed, difficulty to be awakened, there was no seizure-like activity, couple times each week He also complains of frequent headaches, left parietal region throbbing pounding headaches, with associated light noise sensitivity, lasting for a few minutes to hours, he has been taking frequent Tylenol, he has headaches 5 days out of a week, He also complains of episodes of waking up from sleep, hearing people screaming,  Depakote works for his headache in the past, but causing weight gain, tremor,  UPDATE Elijah Huang 20 2016: He is doing well with keppra 1500mg  bid , Topamax 100mg  bid, no recurrent seizure, rarely has headache, Maxalt was helpful.  REVIEW OF SYSTEMS: Full 14 system review of systems performed and notable only for as above ALLERGIES: No Known Allergies  HOME MEDICATIONS: Current Outpatient Prescriptions on File Prior to Visit  Medication Sig Dispense Refill  . cetirizine (ZYRTEC) 10 MG tablet Take 10 mg by mouth daily as needed.     Marland Kitchen  levETIRAcetam (KEPPRA) 500 MG tablet Take 1,500 mg by mouth 2 (two) times daily.     Marland Kitchen omeprazole (PRILOSEC) 40 MG capsule Take 1 capsule (40 mg total) by mouth every Monday, Wednesday, and Friday. 30 capsule 11  . rizatriptan (MAXALT-MLT) 5 MG disintegrating tablet Take 1 tablet (5 mg total) by mouth as needed. May repeat in 2 hours if needed 15 tablet 6  . tadalafil (CIALIS) 20 MG tablet Take 0.5-1 tablets  (10-20 mg total) by mouth every other day as needed for erectile dysfunction. 6 tablet 11  . topiramate (TOPAMAX) 50 MG tablet One po bid x one week, then 2 tabs po bid 120 tablet 6   No current facility-administered medications on file prior to visit.    PAST MEDICAL HISTORY: Past Medical History  Diagnosis Date  . History of chicken pox   . GERD (gastroesophageal reflux disease)   . 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 Seizure disorder 2015    presumed partial seizures, started on depakote, rec no driving for 6 mo (09/3823, Dr Addison Lank)    PAST SURGICAL HISTORY: Past Surgical History  Procedure Laterality Date  . Tonsillectomy  1989    FAMILY HISTORY: Family History  Problem Relation Age of Onset  . Diabetes Father   . Coronary artery disease Father 31    cabg 74, nonsmoker, obese  . Coronary artery disease Paternal Grandfather 53    deceased from same  . Cancer Maternal Grandfather     pancreatic  . Stroke Neg Hx     SOCIAL HISTORY:  History   Social History  . Marital Status: Married    Spouse Name: N/A    Number of Children: 0  . Years of Education: N/A   Occupational History  . [employer], Librarian, academic    Social History Main Topics  . Smoking status: Never Smoker   . Smokeless tobacco: Current User    Types: Snuff     Comment: occasional  . Alcohol Use: Yes     Comment: occasional  . Drug Use: No  . Sexual Activity: Not Currently   Other Topics Concern  . Not on file   Social History Narrative   Caffeine: 1-2 soft drinks/day   Lives alone, 2 dogs   Occupation: works at [employer]   Activity: no regular activity   Diet: good amt water, lots of fried foods, few fruits/vegetables.     PHYSICAL EXAM   Filed Vitals:   10/30/14 1127  BP: 114/79  Pulse: 77  Height: 6' (1.829 m)  Weight: 193 lb (87.544 kg)    Not recorded      Body mass index is 26.17  kg/(m^2). PHYSICAL EXAMNIATION:  Gen: NAD, conversant, well nourised, obese, well groomed                     Cardiovascular: Regular rate rhythm, no peripheral edema, warm, nontender. Eyes: Conjunctivae clear without exudates or hemorrhage Neck: Supple, no carotid bruise. Pulmonary: Clear to auscultation bilaterally   NEUROLOGICAL EXAM:  MENTAL STATUS: Speech:    Speech is normal; fluent and spontaneous with normal comprehension.  Cognition:    The patient is oriented to person, place, and time;     recent and remote memory intact;     language fluent;     normal attention, concentration,     fund of knowledge.  CRANIAL NERVES: CN II: Visual fields are full  to confrontation. Fundoscopic exam is normal with sharp discs and no vascular changes. Venous pulsations are present bilaterally. Pupils are 4 mm and briskly reactive to light. Visual acuity is 20/20 bilaterally. CN III, IV, VI: extraocular movement are normal. No ptosis. CN V: Facial sensation is intact to pinprick in all 3 divisions bilaterally. Corneal responses are intact.  CN VII: Face is symmetric with normal eye closure and smile. CN VIII: Hearing is normal to rubbing fingers CN IX, X: Palate elevates symmetrically. Phonation is normal. CN XI: Head turning and shoulder shrug are intact CN XII: Tongue is midline with normal movements and no atrophy.  MOTOR: There is no pronator drift of out-stretched arms. Muscle bulk and tone are normal. Muscle strength is normal.   Shoulder abduction Shoulder external rotation Elbow flexion Elbow extension Wrist flexion Wrist extension Finger abduction Hip flexion Knee flexion Knee extension Ankle dorsi flexion Ankle plantar flexion  R 5 5 5 5 5 5 5 5 5 5 5 5   L 5 5 5 5 5 5 5 5 5 5 5 5     REFLEXES: Reflexes are 2+ and symmetric at the biceps, triceps, knees, and ankles. Plantar responses are flexor.  SENSORY: Light touch, pinprick, position sense, and vibration sense are intact  in fingers and toes.  COORDINATION: Rapid alternating movements and fine finger movements are intact. There is no dysmetria on finger-to-nose and heel-knee-shin. There are no abnormal or extraneous movements.   GAIT/STANCE: Posture is normal. Gait is steady with normal steps, base, arm swing, and turning. Heel and toe walking are normal. Tandem gait is normal.  Romberg is absent.     DIAGNOSTIC DATA (LABS, IMAGING, TESTING) - I reviewed patient records, labs, notes, testing and imaging myself where available.  Lab Results  Component Value Date   WBC 4.8 10/21/2014   HGB 16.6 10/21/2014   HCT 48.2 10/21/2014   MCV 83.0 10/21/2014   PLT 277 10/21/2014      Component Value Date/Time   NA 143 10/21/2014 1015   K 3.9 10/21/2014 1015   CL 112 10/21/2014 1015   CO2 21 10/21/2014 1015   GLUCOSE 113* 10/21/2014 1015   BUN 14 10/21/2014 1015   CREATININE 1.23 10/21/2014 1015   CALCIUM 9.6 10/21/2014 1015   PROT 7.2 12/09/2013 1400   ALBUMIN 4.5 12/09/2013 1400   AST 19 12/09/2013 1400   ALT 22 12/09/2013 1400   ALKPHOS 84 12/09/2013 1400   BILITOT 0.7 12/09/2013 1400   GFRNONAA 77* 10/21/2014 1015   GFRAA 89* 10/21/2014 1015   Lab Results  Component Value Date   CHOL 140 05/30/2013   HDL 48.80 05/30/2013   LDLCALC 71 05/30/2013   TRIG 99.0 05/30/2013   CHOLHDL 3 05/30/2013   Lab Results  Component Value Date   TSH 1.42 05/30/2013    ASSESSMENT AND PLAN  Elijah Huang is a 34 y.o. male with probable partial seizure seizure, normal neurological examination, CAT scan of the brain, MRI of the brain, EEG   1,  Keep Keppra to 1500mg  bid 2,  Topamax 100 mg twice a day  3. Migraine Maxalt as needed  4, return to clinic in 6 months with Elijah Huang, M.D. Ph.D.  [facility] [address] [place], [place] [address] [phone]

## 2014-11-26 ENCOUNTER — Telehealth: Payer: Self-pay | Admitting: Neurology

## 2014-11-26 NOTE — Telephone Encounter (Signed)
Asa Lente from [employer] ( Pt's employer)( [phone] to request additional letter allowing him to return to work. Please call and advise. Fax  [phone]

## 2014-11-27 ENCOUNTER — Encounter: Payer: Self-pay | Admitting: *Deleted

## 2014-11-27 NOTE — Telephone Encounter (Signed)
Requested letter written and faxed to [facility].

## 2014-12-04 ENCOUNTER — Ambulatory Visit (INDEPENDENT_AMBULATORY_CARE_PROVIDER_SITE_OTHER): Payer: BLUE CROSS/BLUE SHIELD | Admitting: Internal Medicine

## 2014-12-04 ENCOUNTER — Encounter: Payer: Self-pay | Admitting: Internal Medicine

## 2014-12-04 VITALS — BP 120/84 | HR 92 | Temp 98.9°F | Wt 180.0 lb

## 2014-12-04 DIAGNOSIS — B9789 Other viral agents as the cause of diseases classified elsewhere: Secondary | ICD-10-CM

## 2014-12-04 DIAGNOSIS — B349 Viral infection, unspecified: Secondary | ICD-10-CM | POA: Diagnosis not present

## 2014-12-04 DIAGNOSIS — J329 Chronic sinusitis, unspecified: Secondary | ICD-10-CM

## 2014-12-04 NOTE — Patient Instructions (Signed)

## 2014-12-04 NOTE — Progress Notes (Signed)
HPI  Pt presents to the clinic today with ca/o facial pain and pressure, pain in his teeth, ear fullness and nasal congestion. This started 5 days ago. He is blowing some clear mucous out of his nose. He denies fever, chills or body aches. He has tried OTC cold and flu medication without any relief. He has no history of allergies or breathing problems. He has not had sick contacts. He does not smoke.  Review of Systems    Past Medical History  Diagnosis Date  . History of chicken pox   . GERD (gastroesophageal reflux disease)   . 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 Seizure disorder 2015    presumed partial seizures, started on depakote, rec no driving for 6 mo (02/1156, Dr Addison Lank)    Family History  Problem Relation Age of Onset  . Diabetes Father   . Coronary artery disease Father 63    cabg 11, nonsmoker, obese  . Coronary artery disease Paternal Grandfather 16    deceased from same  . Cancer Maternal Grandfather     pancreatic  . Stroke Neg Hx     History   Social History  . Marital Status: Married    Spouse Name: N/A  . Number of Children: 0  . Years of Education: 12   Occupational History  . [employer]--works in mailroom    Social History Main Topics  . Smoking status: Never Smoker   . Smokeless tobacco: Current User    Types: Snuff     Comment: occasional  . Alcohol Use: 0.0 oz/week    0 Standard drinks or equivalent per week     Comment: occasional  . Drug Use: No  . Sexual Activity: Not Currently   Other Topics Concern  . Not on file   Social History Narrative   Caffeine: 1-2 soft drinks/day   Lives alone and his girlfriend  1 dog   Occupation: works at [employer]   Activity: no regular activity   Diet: good amt water, lots of fried foods, few fruits/vegetables.    No Known Allergies   Constitutional: Positive headache. Denies fatigue, fever or abrupt weight changes.   HEENT:  Positive facial pain, nasal congestion, ear fullness. Denies eye redness, ear pain, ringing in the ears, wax buildup, runny nose or sore throat. Respiratory: Denies cough, difficulty breathing or shortness of breath.  Cardiovascular: Denies chest pain, chest tightness, palpitations or swelling in the hands or feet.   No other specific complaints in a complete review of systems (except as listed in HPI above).  Objective:   BP 120/84 mmHg  Pulse 92  Temp(Src) 98.9 F (37.2 C) (Oral)  Wt 180 lb (81.647 kg)  SpO2 99%  General: Appears his stated age, well developed, well nourished in NAD. HEENT: Head: normal shape and size, mild frontal sinus tenderness noted; Eyes: sclera white, no icterus, conjunctiva pink; Ears: Tm's gray and intact, normal light reflex; Nose: mucosa boggy and moist, septum midline; Throat/Mouth: Teeth present, mucosa pink and moist, no exudate noted, no lesions or ulcerations noted.  Neck: No adenopathy noted. Cardiovascular: Normal rate and rhythm. S1,S2 noted.   Pulmonary/Chest: Normal effort and positive vesicular breath sounds. No respiratory distress. No wheezes, rales or ronchi noted.      Assessment & Plan:   Acute viralsinusitis  Can use a Neti Pot which can be purchased from your local drug store. Sudafed twice daily x 3 days.  If no improvement by Monday, call me, will call in Augmentin BID x 10 days  RTC as needed or if symptoms persist.

## 2014-12-04 NOTE — Progress Notes (Signed)
Pre visit review using our clinic review tool, if applicable. No additional management support is needed unless otherwise documented below in the visit note. 

## 2014-12-06 ENCOUNTER — Telehealth: Payer: Self-pay

## 2014-12-06 MED ORDER — AMOXICILLIN-POT CLAVULANATE 875-125 MG PO TABS: 1.0000 | ORAL_TABLET | Freq: Two times a day (BID) | ORAL | Status: DC

## 2014-12-06 NOTE — Telephone Encounter (Signed)
Augmentin sent to [facility]

## 2014-12-06 NOTE — Telephone Encounter (Signed)
Pt left v/m; pt was seen on 12/04/14 with possible sinus infection and pt has been taking sudafed;pt is not feeling any better; if pt was not better to cb on 12/09/14 for abx. Since [facility] is not open on 12/09/14 pt request abx sent to [place] today. Pt request cb.

## 2014-12-06 NOTE — Addendum Note (Signed)
Addended by: Jearld Fenton on: 12/06/2014 12:27 PM   Modules accepted: Orders

## 2014-12-06 NOTE — Telephone Encounter (Signed)
Left message on voicemail.

## 2015-04-30 ENCOUNTER — Ambulatory Visit (INDEPENDENT_AMBULATORY_CARE_PROVIDER_SITE_OTHER): Payer: BLUE CROSS/BLUE SHIELD | Admitting: Nurse Practitioner

## 2015-04-30 ENCOUNTER — Encounter: Payer: Self-pay | Admitting: Nurse Practitioner

## 2015-04-30 VITALS — BP 131/81 | HR 71 | Ht 72.0 in | Wt 167.8 lb

## 2015-04-30 DIAGNOSIS — G40909 Epilepsy, unspecified, not intractable, without status epilepticus: Secondary | ICD-10-CM | POA: Diagnosis not present

## 2015-04-30 DIAGNOSIS — G43009 Migraine without aura, not intractable, without status migrainosus: Secondary | ICD-10-CM | POA: Diagnosis not present

## 2015-04-30 MED ORDER — RIZATRIPTAN BENZOATE 5 MG PO TBDP: 5.0000 mg | ORAL_TABLET | ORAL | Status: DC | PRN

## 2015-04-30 NOTE — Patient Instructions (Signed)
Keep Keppra to 1500mg  bid Topamax 100 mg twice a day  Migraine Maxalt as needed will refill  Given list of migraine triggers eliminate one at a time Follow up  In 6 months

## 2015-04-30 NOTE — Progress Notes (Signed)
[facility]  PATIENT: Elijah Huang DOB: 11/11/1980   REASON FOR VISIT: Follow-up for seizure disorder, migraine headaches Elijah FROM: Patient    Elijah OF PRESENT ILLNESS: Elijah Huang is a 34 years old right-handed Caucasian male, accompanied by his girlfriend, referred by his primary care physician Dr. Danise Mina for evaluation of possible seizure, with his girl friend Elijah Huang had past medical Elijah of GERD, erectile dysfunction He presented with seizure-like event, the initial episode was witness by his girlfriend, Dec 07 2013, after waking up talking with his girlfriend for a while, shortly afterwards, he was noticed to lie on his left side, body shaking, loss of consciousness, lasting for a few minutes, with tongue biting, lip numbness, next day, in May 30th 2015, while taking a shower, his girlfriend heard a loud thundering, he was sitting in the shower, confused, did not know what has happened, no self injury, He denied previous Elijah of seizure, he presented to the emergency room Dec 09 2013, with constellation of complaints, intermittent chest pain, blurry vision, left eye pain, lightheadedness, He had a CAT scan of the brain that was normal, laboratory showed normal CBC, CMP, negative troponin, He works as a Physiological scientist UPDATE Oct 14th 2015: While taking titrating dose of Depakote he continued to have recurrent sudden onset of lost of consciousness, MRI of the brain was normal, EEG was normal June 3rd 2015,, spaced out while talking with his nieced, lasting 2 minutes, no seizure activity  June 10th 2015: at work, become unresponsive, Depakote ER was increased from 500 mg one tablet a day, to 2 tablets every night July 2nd 2015, in the car with his girl friend, spaced out, lasting 3-4 minues, staring, no body shaking, July 3rd 2015: goes into staring, noot responside, when he comes too, his hand tremor. Sept 24th 2015: At work, he  became unresponsive to his manager, staring, hand tremor, mild urinary incontinence He has no collection of hte events, no warning signs,  He noticed side effects on Depakote this includes weight gain, increased bilateral hands tremor , Depakote was stop in Nov 2015, started Keppra  UPDATE Nov 18th 2015:He is tolerating Keppra 1000 mg twice a day better, He has much less side effect, He only has one episode of possible seizure, May 22 2014, he fell out of the bed, injured his front teeth, there was no seizure witnessed  UPDATE Feb 17th 2016:He is overall doing very well, taking Keppra 1500 mg twice a day, mild moodiness, He has episodes of sound sleep,head hang off the bed, difficulty to be awakened, there was no seizure-like activity, couple times each week He also complains of frequent headaches, left parietal region throbbing pounding headaches, with associated light noise sensitivity, lasting for a few minutes to hours, he has been taking frequent Tylenol, he has headaches 5 days out of a week, He also complains of episodes of waking up from sleep, hearing people screaming, UPDATE October 30 2014:He is doing well with keppra 1500mg  bid , Topamax 100mg  bid, no recurrent seizure, rarely has headache, Maxalt was helpful.  UPDATE 04/30/2015 Elijah Huang, 34 year old male returns for follow-up he has been seen in the past by Dr. Krista Blue in our clinic . He has a Elijah of generalized seizure disorder and migraine headaches. He is currently on Keppra 1500 mg twice daily and Topamax 100mg   twice daily. Last seizure was 05/22/2014. He continues to have an occasional headache which is usually relieved with Maxalt. He needs refills  on Maxalt. He is not aware of any food triggers. He returns for reevaluation   REVIEW OF SYSTEMS: Full 14 system review of systems performed and notable only for those listed, all others are neg:  Constitutional: neg  Cardiovascular: neg Ear/Nose/Throat: neg  Skin: neg Eyes: Blurred  vision Respiratory: neg Gastroitestinal: neg  Hematology/Lymphatic: neg  Endocrine: neg Musculoskeletal:neg Allergy/Immunology: neg Neurological: Headache, seizure disorder Psychiatric: neg Sleep : neg   ALLERGIES: No Known Allergies  HOME MEDICATIONS: Outpatient Prescriptions Prior to Visit  Medication Sig Dispense Refill  . cetirizine (ZYRTEC) 10 MG tablet Take 10 mg by mouth daily as needed.     . levETIRAcetam (KEPPRA) 500 MG tablet Take 3 tablets (1,500 mg total) by mouth 2 (two) times daily. 540 tablet 3  . omeprazole (PRILOSEC) 40 MG capsule Take 1 capsule (40 mg total) by mouth every Monday, Wednesday, and Friday. 30 capsule 11  . tadalafil (CIALIS) 20 MG tablet Take 0.5-1 tablets (10-20 mg total) by mouth every other day as needed for erectile dysfunction. 6 tablet 11  . topiramate (TOPAMAX) 100 MG tablet One po bid 180 tablet 3  . rizatriptan (MAXALT-MLT) 5 MG disintegrating tablet Take 1 tablet (5 mg total) by mouth as needed. May repeat in 2 hours if needed 15 tablet 6  . amoxicillin-clavulanate (AUGMENTIN) 875-125 MG per tablet Take 1 tablet by mouth 2 (two) times daily. (Patient not taking: Reported on 04/30/2015) 20 tablet 0   No facility-administered medications prior to visit.    PAST MEDICAL Elijah: Past Medical Elijah  Diagnosis Date  . Elijah of chicken pox   . GERD (gastroesophageal reflux disease)   . 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 Seizure disorder ([place]) 2015    presumed partial seizures, started on depakote, rec no driving for 6 mo (12/2261, Dr Addison Lank)    PAST SURGICAL Elijah: Past Surgical Elijah  Procedure Laterality Date  . Tonsillectomy  1989    FAMILY Elijah: Family Elijah  Problem Relation Age of Onset  . Diabetes Father   . Coronary artery disease Father 37    cabg 70, nonsmoker, obese  . Coronary artery disease Paternal Grandfather 91    deceased from same    . Cancer Maternal Grandfather     pancreatic  . Stroke Neg Hx     SOCIAL Elijah: Social Elijah   Social Elijah  . Marital Status: Married    Spouse Name: N/A  . Number of Children: 0  . Years of Education: 12   Occupational Elijah  . [employer]--works in mailroom    Social Elijah Main Topics  . Smoking status: Never Smoker   . Smokeless tobacco: Current User    Types: Snuff     Comment: occasional  . Alcohol Use: 0.0 oz/week    0 Standard drinks or equivalent per week     Comment: occasional  . Drug Use: No  . Sexual Activity: Not Currently   Other Topics Concern  . Not on file   Social Elijah Narrative   Caffeine: 1-2 soft drinks/day   Lives alone and his girlfriend  1 dog   Occupation: works at [employer]   Activity: no regular activity   Diet: good amt water, lots of fried foods, few fruits/vegetables.     PHYSICAL EXAM  Filed Vitals:   04/30/15 1034  BP: 131/81  Pulse: 71  Height: 6' (1.829 m)  Weight: 167 lb 12.8 oz (76.114  kg)   Body mass index is 22.75 kg/(m^2).  Generalized: Well developed, in no acute distress  Head: normocephalic and atraumatic,. Oropharynx benign  Neck: Supple, no carotid bruits  Cardiac: Regular rate rhythm, no murmur  Musculoskeletal: No deformity   Neurological examination   Mentation: Alert oriented to time, place, Elijah taking. Attention span and concentration appropriate. Recent and remote memory intact.  Follows all commands speech and language fluent.   Cranial nerve II-XII: Fundoscopic exam reveals sharp disc margins.Pupils were equal round reactive to light extraocular movements were full, visual field were full on confrontational test. Facial sensation and strength were normal. hearing was intact to finger rubbing bilaterally. Uvula tongue midline. head turning and shoulder shrug were normal and symmetric.Tongue protrusion into cheek strength was normal. Motor: normal bulk and tone, full strength  in the BUE, BLE, fine finger movements normal, no pronator drift. No focal weakness Sensory: normal and symmetric to light touch, pinprick, and  Vibration, proprioception  Coordination: finger-nose-finger, heel-to-shin bilaterally, no dysmetria Reflexes: Brachioradialis 2/2, biceps 2/2, triceps 2/2, patellar 2/2, Achilles 2/2, plantar responses were flexor bilaterally. Gait and Station: Rising up from seated position without assistance, normal stance,  moderate stride, good arm swing, smooth turning, able to perform tiptoe, and heel walking without difficulty. Tandem gait is steady  DIAGNOSTIC DATA (LABS, IMAGING, TESTING) - I reviewed patient records, labs, notes, testing and imaging myself where available.  Lab Results  Component Value Date   WBC 4.8 10/21/2014   HGB 16.6 10/21/2014   HCT 48.2 10/21/2014   MCV 83.0 10/21/2014   PLT 277 10/21/2014      Component Value Date/Time   NA 143 10/21/2014 1015   K 3.9 10/21/2014 1015   CL 112 10/21/2014 1015   CO2 21 10/21/2014 1015   GLUCOSE 113* 10/21/2014 1015   BUN 14 10/21/2014 1015   CREATININE 1.23 10/21/2014 1015   CALCIUM 9.6 10/21/2014 1015   PROT 7.2 12/09/2013 1400   ALBUMIN 4.5 12/09/2013 1400   AST 19 12/09/2013 1400   ALT 22 12/09/2013 1400   ALKPHOS 84 12/09/2013 1400   BILITOT 0.7 12/09/2013 1400   GFRNONAA 36* 10/21/2014 1015   GFRAA 14* 10/21/2014 1015    ASSESSMENT AND PLAN  34 y.o. year old male  has a past medical Elijah of  ED (erectile dysfunction) of non-organic origin (2014); and Seizure disorder ([place]) (2015). and migraine headaches here to follow-up.   Keep Keppra to 1500mg  bid Continue Topamax 100 mg twice a day  Continue Maxalt as needed will refill  I spent additional 10 minutes in total face to face time with the patient more than 50% of which was spent counseling and coordination of care, reviewing test results reviewing medications and discussing and reviewing the diagnosis of migraine and  further treatment options. Importance of keeping a diary if headaches worsen to include the time of the headache what you're doing any other specific information that would be useful. Discussed stress relief techniques such as deep breathing muscle relaxation mental relaxation to music. Discussed importance of exercise, regular meals  and sleep. Sleep deprivation can be a migraine trigger Given list of migraine triggers that are foods reviewed the list  eliminate one at a time Follow up  In 6 months Vst time 20 min Dennie Bible, [facility], [facility], [facility] [address] [place], [place] [address] [phone]

## 2015-05-03 NOTE — Progress Notes (Signed)
I have reviewed and agreed above plan. 

## 2015-05-08 NOTE — Telephone Encounter (Signed)
Error

## 2015-07-01 ENCOUNTER — Encounter: Payer: Self-pay | Admitting: Family Medicine

## 2015-07-15 ENCOUNTER — Telehealth: Payer: Self-pay | Admitting: Nurse Practitioner

## 2015-07-15 NOTE — Telephone Encounter (Signed)
Patient called regarding side effects he's having from levETIRAcetam (KEPPRA) 500 MG tablet, irritability and a lot of tiredness.

## 2015-07-16 NOTE — Telephone Encounter (Signed)
He needs apt to change meds

## 2015-07-16 NOTE — Telephone Encounter (Addendum)
I spoke to pt.   He started on Keppra 100mg  po bid 05-2014.  Increased to 1500mg  po bid 05-30-2015.  Has noted continued/worsening SE of tiredness, irritability, sore muscles.  Taking topamax for headaches which has helped and prn maxalt.  Change to something different?

## 2015-07-17 NOTE — Telephone Encounter (Signed)
I called pt , made appt for 07-22-15 at 0830.

## 2015-07-22 ENCOUNTER — Ambulatory Visit: Payer: Self-pay | Admitting: Nurse Practitioner

## 2015-07-24 ENCOUNTER — Ambulatory Visit (INDEPENDENT_AMBULATORY_CARE_PROVIDER_SITE_OTHER): Payer: BLUE CROSS/BLUE SHIELD | Admitting: Nurse Practitioner

## 2015-07-24 ENCOUNTER — Encounter: Payer: Self-pay | Admitting: Nurse Practitioner

## 2015-07-24 VITALS — BP 122/72 | HR 66 | Ht 72.0 in | Wt 169.2 lb

## 2015-07-24 DIAGNOSIS — G43009 Migraine without aura, not intractable, without status migrainosus: Secondary | ICD-10-CM | POA: Diagnosis not present

## 2015-07-24 DIAGNOSIS — G40909 Epilepsy, unspecified, not intractable, without status epilepticus: Secondary | ICD-10-CM | POA: Diagnosis not present

## 2015-07-24 MED ORDER — VIMPAT 50 MG PO TABS: 50.0000 mg | ORAL_TABLET | Freq: Two times a day (BID) | ORAL | Status: DC

## 2015-07-24 NOTE — Progress Notes (Signed)
[facility]  PATIENT: Elijah Huang DOB: [birth year]   REASON FOR VISIT: Follow-up for migraine without aura, generalized seizure disorder HISTORY FROM: Patient and girlfriend    HISTORY OF PRESENT ILLNESS:HISTORY Elijah Huang is a 35 years old right-handed Caucasian male, accompanied by his girlfriend, referred by his primary care physician Dr. Danise Huang for evaluation of possible seizure, with his girl friend AprilHe had past medical history of GERD, erectile dysfunction He presented with seizure-like event, the initial episode was witness by his girlfriend, Dec 07 2013, after waking up talking with his girlfriend for a while, shortly afterwards, he was noticed to lie on his left side, body shaking, loss of consciousness, lasting for a few minutes, with tongue biting, lip numbness, next day, in May 30th 2015, while taking a shower, his girlfriend heard a loud thundering, he was sitting in the shower, confused, did not know what has happened, no self injury, He denied previous history of seizure, he presented to the emergency room Dec 09 2013, with constellation of complaints, intermittent chest pain, blurry vision, left eye pain, lightheadedness, He had a CAT scan of the brain that was normal, laboratory showed normal CBC, CMP, negative troponin, 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 Sept  24th 2015: At work, he became unresponsive to his manager, staring, hand tremor, mild urinary incontinence He has no collection of hte events, no warning signs,  He noticed side effects on Depakote this includes weight gain, increased bilateral hands tremor , Depakote was stop in Nov 2015, started Keppra  UPDATE Nov 18th 2015:YYHe is tolerating Keppra 1000 mg twice a day better, He has much less side effect, He only has one episode of possible seizure, May 22 2014, he fell out of the bed, injured his front teeth, there was no seizure witnessed  UPDATE Feb 17th 2016:He is overall doing very well, taking Keppra 1500 mg twice a day, mild moodiness, He has episodes of sound sleep,head hang off the bed, difficulty to be awakened, there was no seizure-like activity, couple times each week He also complains of frequent headaches, left parietal region throbbing pounding headaches, with associated light noise sensitivity, lasting for a few minutes to hours, he has been taking frequent Tylenol, he has headaches 5 days out of a week, He also complains of episodes of waking up from sleep, hearing people screaming, UPDATE Elijah Huang 20 2016:He is doing well with keppra 1500mg  bid , Topamax 100mg  bid, no recurrent seizure, rarely has headache, Maxalt was helpful.  UPDATE 04/30/2015 CMMr. Huang, 35 year old male returns for follow-up he has been seen in the past by Elijah Huang in our clinic . He has a history of generalized seizure disorder and migraine headaches. He is currently on Keppra 1500 mg twice daily and Topamax 100mg  twice daily. Last seizure was 05/22/2014. He continues to have an occasional headache which is usually relieved with Maxalt. He  needs refills on Maxalt. He is not aware of any food triggers. He returns for reevaluation  UPDATE 07/24/2015. Elijah Huang, 35 year old male returns for follow-up. He has a history of seizure disorder and migraines. His migraines are in excellent control on Topamax 100 twice  daily and Maxalt acutely. His last seizure event occurred in November 2015. He was last seen in the office 10/6/ 2016. At that time he was doing well on Keppra however he called in to the office several weeks ago with increased irritability and tiredness on the drug. He returns today to talk about changing his seizure medications. He has previously been on Depakote with continued seizures. His previous seizure episodes are described as his body gets stiff with jerking of the extremities and loss of consciousness. These have been witnessed by his girlfriend in the past. He returns for reevaluation  REVIEW OF SYSTEMS: Full 14 system review of systems performed and notable only for those listed, all others are neg:  Constitutional: neg  Cardiovascular: neg Ear/Nose/Throat: neg  Skin: neg Eyes: neg Respiratory: neg Gastroitestinal: neg  Hematology/Lymphatic: neg  Endocrine: neg Musculoskeletal:neg Allergy/Immunology: neg Neurological: neg Psychiatric: neg Sleep : neg   ALLERGIES: No Known Allergies  HOME MEDICATIONS: Outpatient Prescriptions Prior to Visit  Medication Sig Dispense Refill  . cetirizine (ZYRTEC) 10 MG tablet Take 10 mg by mouth daily as needed.     . levETIRAcetam (KEPPRA) 500 MG tablet Take 3 tablets (1,500 mg total) by mouth 2 (two) times daily. 540 tablet 3  . omeprazole (PRILOSEC) 40 MG capsule Take 1 capsule (40 mg total) by mouth every Monday, Wednesday, and Friday. 30 capsule 11  . rizatriptan (MAXALT-MLT) 5 MG disintegrating tablet Take 1 tablet (5 mg total) by mouth as needed. May repeat in 2 hours if needed 15 tablet 6  . tadalafil (CIALIS) 20 MG tablet Take 0.5-1 tablets (10-20 mg total) by mouth every other day as needed for erectile dysfunction. 6 tablet 11  . topiramate (TOPAMAX) 100 MG tablet One po bid 180 tablet 3   No facility-administered medications prior to visit.    PAST MEDICAL HISTORY: Past Medical History  Diagnosis Date  . History of chicken  pox   . GERD (gastroesophageal reflux disease)   . 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 Seizure disorder ([place]) 2015    presumed partial seizures, started on depakote, rec no driving for 6 mo ([id number], Dr Addison Lank)    PAST SURGICAL HISTORY: Past Surgical History  Procedure Laterality Date  . Tonsillectomy  1989    FAMILY HISTORY: Family History  Problem Relation Age of Onset  . Diabetes Father   . Coronary artery disease Father 108    cabg 26, nonsmoker, obese  . Coronary artery disease Paternal Grandfather 44    deceased from same  . Cancer Maternal Grandfather     pancreatic  . Stroke Neg Hx   . Cancer Father 18    prostate    SOCIAL HISTORY: Social History   Social History  . Marital Status: Married    Spouse Name: N/A  . Number of Children: 0  . Years of Education: 12   Occupational History  . [employer]--works in mailroom    Social History Main Topics  . Smoking status: Never Smoker   . Smokeless tobacco: Current User    Types: Snuff     Comment: occasional  . Alcohol Use: 0.0 oz/week    0  Standard drinks or equivalent per week     Comment: occasional  . Drug Use: No  . Sexual Activity: Not Currently   Other Topics Concern  . Not on file   Social History Narrative   Caffeine: 1-2 soft drinks/day   Lives alone and his girlfriend  1 dog   Occupation: works at [employer]   Activity: no regular activity   Diet: good amt water, lots of fried foods, few fruits/vegetables.     PHYSICAL EXAM  Filed Vitals:   07/24/15 0829  BP: 122/72  Pulse: 66  Height: 6' (1.829 m)  Weight: 169 lb 3.2 oz (76.749 kg)   Body mass index is 22.94 kg/(m^2). Generalized: Well developed, in no acute distress  Head: normocephalic and atraumatic,. Oropharynx benign  Neck: Supple, no carotid bruits  Cardiac: Regular rate rhythm, no murmur  Musculoskeletal: No deformity   Neurological examination    Mentation: Alert oriented to time, place, history taking. Attention span and concentration appropriate. Recent and remote memory intact. Follows all commands speech and language fluent.   Cranial nerve II-XII: Visual acuity 20/40 right 20/30 left. Fundoscopic exam reveals sharp disc margins.Pupils were equal round reactive to light extraocular movements were full, visual field were full on confrontational test. Facial sensation and strength were normal. hearing was intact to finger rubbing bilaterally. Uvula tongue midline. head turning and shoulder shrug were normal and symmetric.Tongue protrusion into cheek strength was normal. Motor: normal bulk and tone, full strength in the BUE, BLE, fine finger movements normal, no pronator drift. No focal weakness Sensory: normal and symmetric to light touch, pinprick, and Vibration, proprioception  Coordination: finger-nose-finger, heel-to-shin bilaterally, no dysmetria Reflexes: Brachioradialis 2/2, biceps 2/2, triceps 2/2, patellar 2/2, Achilles 2/2, plantar responses were flexor bilaterally. Gait and Station: Rising up from seated position without assistance, normal stance, moderate stride, good arm swing, smooth turning, able to perform tiptoe, and heel walking without difficulty. Tandem gait is steady   DIAGNOSTIC DATA (LABS, IMAGING, TESTING) - I reviewed patient records, labs, notes, testing and imaging myself where available.  Lab Results  Component Value Date   WBC 4.8 10/21/2014   HGB 16.6 10/21/2014   HCT 48.2 10/21/2014   MCV 83.0 10/21/2014   PLT 277 10/21/2014      Component Value Date/Time   NA 143 10/21/2014 1015   K 3.9 10/21/2014 1015   CL 112 10/21/2014 1015   CO2 21 10/21/2014 1015   GLUCOSE 113* 10/21/2014 1015   BUN 14 10/21/2014 1015   CREATININE 1.23 10/21/2014 1015   CALCIUM 9.6 10/21/2014 1015   PROT 7.2 12/09/2013 1400   ALBUMIN 4.5 12/09/2013 1400   AST 19 12/09/2013 1400   ALT 22 12/09/2013 1400    ALKPHOS 84 12/09/2013 1400   BILITOT 0.7 12/09/2013 1400   GFRNONAA 38* 10/21/2014 1015   GFRAA 61* 10/21/2014 1015    ASSESSMENT AND PLAN  35 y.o. year old male  has a past medical history of  ED (erectile dysfunction) of non-organic origin (2014); and Seizure disorder ([place]) (2015). and migraine headaches here to follow-up.  PLAN: Vimpat 50 mg twice daily co pay  card given with RX  May begin tapering Keppra by 500 mg every 5 days until discontinued important to taper as specified Continue Topamax 100 mg twice daily Call for increase in seizure activity Additional questions answered regarding types of seizure medication Follow-up in 3 monthsVst time 25 min Dennie Bible, [facility], [facility], APRN  [facility] [phone] [address]  [address] [place], [place] [address] [phone]

## 2015-07-24 NOTE — Patient Instructions (Signed)
Vimpat 50 mg twice daily co pay  card given with RX  May begin tapering Keppra by 500 mg every 5 days until discontinued Continue Topamax 100 mg twice daily Call for increase in seizure activity Follow-up in 3 months

## 2015-07-29 ENCOUNTER — Telehealth: Payer: Self-pay | Admitting: *Deleted

## 2015-07-29 NOTE — Telephone Encounter (Signed)
Pt called and had question.  He is taking vimpat now, will start taper of keppra, but wanted clarification about how to taper off keppra.   He is taking  3 - 500mg  tablets po bid.  He is to take 1 tablet 500mg  away , then another in 5 days.  (alternate day then night).  Will be 30 days for tapering.  He is to call back if questions.

## 2015-09-17 ENCOUNTER — Other Ambulatory Visit: Payer: Self-pay | Admitting: Family Medicine

## 2015-10-22 ENCOUNTER — Other Ambulatory Visit: Payer: Self-pay | Admitting: *Deleted

## 2015-10-22 MED ORDER — TOPIRAMATE 100 MG PO TABS: ORAL_TABLET | ORAL | Status: DC

## 2015-10-23 ENCOUNTER — Telehealth: Payer: Self-pay | Admitting: *Deleted

## 2015-10-23 NOTE — Telephone Encounter (Signed)
Pa required for Cialis. Submitted and approved through Cover My Meds.

## 2015-10-29 ENCOUNTER — Ambulatory Visit (INDEPENDENT_AMBULATORY_CARE_PROVIDER_SITE_OTHER): Payer: BLUE CROSS/BLUE SHIELD | Admitting: Nurse Practitioner

## 2015-10-29 ENCOUNTER — Encounter: Payer: Self-pay | Admitting: Nurse Practitioner

## 2015-10-29 VITALS — BP 128/80 | HR 77 | Resp 16 | Ht 72.0 in | Wt 168.0 lb

## 2015-10-29 DIAGNOSIS — G40909 Epilepsy, unspecified, not intractable, without status epilepticus: Secondary | ICD-10-CM | POA: Diagnosis not present

## 2015-10-29 DIAGNOSIS — G43009 Migraine without aura, not intractable, without status migrainosus: Secondary | ICD-10-CM | POA: Diagnosis not present

## 2015-10-29 MED ORDER — RIZATRIPTAN BENZOATE 5 MG PO TBDP: 5.0000 mg | ORAL_TABLET | ORAL | Status: DC | PRN

## 2015-10-29 NOTE — Progress Notes (Signed)
[facility]  PATIENT: Elijah Huang DOB: [birth year]   REASON FOR VISIT: Follow-up for seizure disorder and migraine HISTORY FROM: Patient    HISTORY OF PRESENT ILLNESS: HISTORY Elijah Huang is a 35 years old right-handed Caucasian male, accompanied by his girlfriend, referred by his primary care physician Dr. Danise Mina for evaluation of possible seizure, with his girl friend AprilHe had past medical history of GERD, erectile dysfunction He presented with seizure-like event, the initial episode was witness by his girlfriend, Dec 07 2013, after waking up talking with his girlfriend for a while, shortly afterwards, he was noticed to lie on his left side, body shaking, loss of consciousness, lasting for a few minutes, with tongue biting, lip numbness, next day, in May 30th 2015, while taking a shower, his girlfriend heard a loud thundering, he was sitting in the shower, confused, did not know what has happened, no self injury, He denied previous history of seizure, he presented to the emergency room Dec 09 2013, with constellation of complaints, intermittent chest pain, blurry vision, left eye pain, lightheadedness, He had a CAT scan of the brain that was normal, laboratory showed normal CBC, CMP, negative troponin, He works as a Physiological scientist UPDATE Oct 14th 2015:Elijah Huang While taking titrating dose of Depakote he continued to have recurrent sudden onset of lost of consciousness, MRI of the brain was normal, EEG was normal June 3rd 2015,, spaced out while talking with his nieced, lasting 2 minutes, no seizure activity  June 10th 2015: at work, become unresponsive, Depakote ER was increased from 500 mg one tablet a day, to 2 tablets every night July 2nd 2015, in the car with his girl friend, spaced out, lasting 3-4 minues, staring, no body shaking, July 3rd 2015: goes into staring, noot responside, when he comes too, his hand tremor. Sept 24th 2015: At work, he became  unresponsive to his manager, staring, hand tremor, mild urinary incontinence He has no collection of hte events, no warning signs,  He noticed side effects on Depakote this includes weight gain, increased bilateral hands tremor , Depakote was stop in Nov 2015, started Keppra  UPDATE October 30 2014:He is doing well with keppra 1500mg  bid , Topamax 100mg  bid, no recurrent seizure, rarely has headache, Maxalt was helpful.  UPDATE 04/30/2015 CMMr. Huang, 35 year old male returns for follow-up he has been seen in the past by Dr. Krista Blue in our clinic . He has a history of generalized seizure disorder and migraine headaches. He is currently on Keppra 1500 mg twice daily and Topamax 100mg  twice daily. Last seizure was 05/22/2014. He continues to have an occasional headache which is usually relieved with Maxalt. He needs refills on Maxalt. He is not aware of any food triggers. He returns for reevaluation  UPDATE 07/24/2015. CMMr. Huang, 35 year old male returns for follow-up. He has a history of seizure disorder and migraines. His migraines are in excellent control on Topamax 100 twice daily and Maxalt acutely. His last seizure event occurred in November 2015. He was last seen in the office 10/6/ 2016. At that time he was doing well on Keppra however he called in to the office several weeks ago with increased irritability and tiredness on the drug. He returns today to talk about changing his seizure medications. He has previously been on Depakote with continued seizures. His previous seizure episodes are described as his body gets stiff with jerking of the extremities and loss of consciousness. These have been witnessed by his girlfriend in the past.  He returns for reevaluation UPDATE 04/19/2017CM Elijah Huang, 35 year old male returns for follow-up he was last seen in the office 07/24/2015. He has history of migraines that are in excellent control with Topamax also has a history of seizure disorder some increased irritability  and tiredness on Keppra reported at last visit. He was successfully switched to Vimpat and has tapered off of Keppra without difficulty. He claims he feels much better. He returns for reevaluation no further seizure activity.  REVIEW OF SYSTEMS: Full 14 system review of systems performed and notable only for those listed, all others are neg:  Constitutional: neg  Cardiovascular: neg Ear/Nose/Throat: neg  Skin: neg Eyes: neg Respiratory: neg Gastroitestinal: neg  Hematology/Lymphatic: neg  Endocrine: neg Musculoskeletal:neg Allergy/Immunology: neg Neurological: neg Psychiatric: neg Sleep : neg   ALLERGIES: No Known Allergies  HOME MEDICATIONS: Outpatient Prescriptions Prior to Visit  Medication Sig Dispense Refill  . cetirizine (ZYRTEC) 10 MG tablet Take 10 mg by mouth daily as needed.     Marland Kitchen omeprazole (PRILOSEC) 40 MG capsule TAKE ONE CAPSULE BY MOUTH EVERY MONDAY WEDNESDAY  AND FRIDAY 30 capsule 6  . rizatriptan (MAXALT-MLT) 5 MG disintegrating tablet Take 1 tablet (5 mg total) by mouth as needed. May repeat in 2 hours if needed 15 tablet 6  . tadalafil (CIALIS) 20 MG tablet Take 0.5-1 tablets (10-20 mg total) by mouth every other day as needed for erectile dysfunction. 6 tablet 11  . topiramate (TOPAMAX) 100 MG tablet One po bid 180 tablet 3  . VIMPAT 50 MG TABS tablet Take 1 tablet (50 mg total) by mouth 2 (two) times daily. 60 tablet 5  . levETIRAcetam (KEPPRA) 500 MG tablet Take 3 tablets (1,500 mg total) by mouth 2 (two) times daily. 540 tablet 3   No facility-administered medications prior to visit.    PAST MEDICAL HISTORY: Past Medical History  Diagnosis Date  . History of chicken pox   . GERD (gastroesophageal reflux disease)   . 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 Seizure disorder ([place]) 2015    presumed partial seizures, started on depakote, rec no driving for 6 mo ([id number], Dr Addison Lank)    PAST  SURGICAL HISTORY: Past Surgical History  Procedure Laterality Date  . Tonsillectomy  1989    FAMILY HISTORY: Family History  Problem Relation Age of Onset  . Diabetes Father   . Coronary artery disease Father 73    cabg 42, nonsmoker, obese  . Coronary artery disease Paternal Grandfather 84    deceased from same  . Cancer Maternal Grandfather     pancreatic  . Stroke Neg Hx   . Cancer Father 46    prostate    SOCIAL HISTORY: Social History   Social History  . Marital Status: Married    Spouse Name: N/A  . Number of Children: 0  . Years of Education: 12   Occupational History  . [employer]--works in mailroom    Social History Main Topics  . Smoking status: Never Smoker   . Smokeless tobacco: Current User    Types: Snuff     Comment: occasional  . Alcohol Use: 0.0 oz/week    0 Standard drinks or equivalent per week     Comment: occasional  . Drug Use: No  . Sexual Activity: Not Currently   Other Topics Concern  . Not on file   Social History Narrative   Caffeine: 1-2 soft drinks/day   Lives  alone and his girlfriend  1 dog   Occupation: works at [employer]: no regular activity   Diet: good amt water, lots of fried foods, few fruits/vegetables.     PHYSICAL EXAM  Filed Vitals:   10/29/15 1034  BP: 128/80  Pulse: 77  Resp: 16  Height: 6' (1.829 m)  Weight: 168 lb (76.204 kg)   Body mass index is 22.78 kg/(m^2). Generalized: Well developed, in no acute distress  Head: normocephalic and atraumatic,. Oropharynx benign  Neck: Supple, no carotid bruits  Cardiac: Regular rate rhythm, no murmur  Musculoskeletal: No deformity   Neurological examination   Mentation: Alert oriented to time, place, history taking. Attention span and concentration appropriate. Recent and remote memory intact. Follows all commands speech and language fluent.   Cranial nerve II-XII: Fundoscopic exam reveals sharp disc margins.Pupils were equal round  reactive to light extraocular movements were full, visual field were full on confrontational test. Facial sensation and strength were normal. hearing was intact to finger rubbing bilaterally. Uvula tongue midline. head turning and shoulder shrug were normal and symmetric.Tongue protrusion into cheek strength was normal. Motor: normal bulk and tone, full strength in the BUE, BLE, fine finger movements normal, no pronator drift. No focal weakness Sensory: normal and symmetric to light touch, pinprick, and Vibration, proprioception  Coordination: finger-nose-finger, heel-to-shin bilaterally, no dysmetria Reflexes: Brachioradialis 2/2, biceps 2/2, triceps 2/2, patellar 2/2, Achilles 2/2, plantar responses were flexor bilaterally. Gait and Station: Rising up from seated position without assistance, normal stance, moderate stride, good arm swing, smooth turning, able to perform tiptoe, and heel walking without difficulty. Tandem gait is steady  DIAGNOSTIC DATA (LABS, IMAGING, TESTING) -  ASSESSMENT AND PLAN  35 y.o. year old male  has a past medical history of  Seizure disorder ([employer]) (2015). and migraine here to follow-up. He had side effects to Keppra irritability that was successfully transitioned to Vimpat at his last visit. He remains on Topamax for his migraines.  Continue Topamax and Vimpat at current doses.  Continue Maxalt prn will refill Call for increase in headaches or any seizure activity F/U in 6 months Dennie Bible, [facility], [facility], [facility] [address] [place], [place] [address] [phone]

## 2015-10-29 NOTE — Patient Instructions (Signed)
Continue Topamax and Vimpat at current doses.  Continue Maxalt prn will refill F/U in 6 months

## 2015-11-02 ENCOUNTER — Encounter (HOSPITAL_COMMUNITY): Payer: Self-pay | Admitting: *Deleted

## 2015-11-02 ENCOUNTER — Ambulatory Visit (HOSPITAL_COMMUNITY)
Admission: EM | Admit: 2015-11-02 | Discharge: 2015-11-02 | Disposition: A | Discharge status: Home / Self Care | Payer: BLUE CROSS/BLUE SHIELD | Attending: Family Medicine | Admitting: Family Medicine

## 2015-11-02 DIAGNOSIS — S161XXA Strain of muscle, fascia and tendon at neck level, initial encounter: Secondary | ICD-10-CM | POA: Diagnosis not present

## 2015-11-02 MED ORDER — CYCLOBENZAPRINE HCL 5 MG PO TABS: 5.0000 mg | ORAL_TABLET | Freq: Three times a day (TID) | ORAL | Status: DC | PRN

## 2015-11-02 MED ORDER — DICLOFENAC POTASSIUM 50 MG PO TABS: 50.0000 mg | ORAL_TABLET | Freq: Three times a day (TID) | ORAL | Status: DC

## 2015-11-02 NOTE — ED Provider Notes (Signed)
CSN: [id number]     Arrival date & time 11/02/15  1611 History   First MD Initiated Contact with Patient 11/02/15 1624     Chief Complaint  Patient presents with  . Neck Pain   (Consider location/radiation/quality/duration/timing/severity/associated sxs/prior Treatment) Patient is a 35 y.o. male presenting with neck pain. The history is provided by the patient.  Neck Pain Pain location:  R side Quality:  Cramping and stiffness Pain radiates to:  Does not radiate Pain severity:  Mild Chronicity:  New Context comment:  Getting out of showerwith sudden pain on right Relieved by:  Nothing Worsened by:  Nothing tried Ineffective treatments:  None tried Associated symptoms: no bladder incontinence, no bowel incontinence, no fever, no leg pain, no numbness, no paresis, no tingling and no weakness     Past Medical History  Diagnosis Date  . History of chicken pox   . GERD (gastroesophageal reflux disease)   . 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 Seizure disorder ([place]) 2015    presumed partial seizures, started on depakote, rec no driving for 6 mo ([id number], Dr Addison Lank)  . Seizures [facility])    Past Surgical History  Procedure Laterality Date  . Tonsillectomy  1989   Family History  Problem Relation Age of Onset  . Diabetes Father   . Coronary artery disease Father 62    cabg 42, nonsmoker, obese  . Coronary artery disease Paternal Grandfather 54    deceased from same  . Cancer Maternal Grandfather     pancreatic  . Stroke Neg Hx   . Cancer Father 73    prostate   Social History  Substance Use Topics  . Smoking status: Never Smoker   . Smokeless tobacco: None     Comment: occasional snuff use per chart  . Alcohol Use: 0.0 oz/week    0 Standard drinks or equivalent per week     Comment: occasional    Review of Systems  Constitutional: Negative.  Negative for fever.  Gastrointestinal: Negative for bowel  incontinence.  Genitourinary: Negative for bladder incontinence.  Musculoskeletal: Positive for neck pain. Negative for back pain, gait problem and neck stiffness.  Skin: Negative.   Neurological: Negative for tingling, weakness and numbness.  All other systems reviewed and are negative.   Allergies  Review of patient's allergies indicates no known allergies.  Home Medications   Prior to Admission medications   Medication Sig Start Date End Date Taking? Authorizing Provider  omeprazole (PRILOSEC) 40 MG capsule TAKE ONE CAPSULE BY MOUTH EVERY MONDAY [facility]  AND FRIDAY 09/17/15  Yes Ria Bush, MD  rizatriptan (MAXALT-MLT) 5 MG disintegrating tablet Take 1 tablet (5 mg total) by mouth as needed. May repeat in 2 hours if needed 10/29/15  Yes Dennie Bible, NP  topiramate (TOPAMAX) 100 MG tablet One po bid 10/22/15  Yes Marcial Pacas, MD  VIMPAT 50 MG TABS tablet Take 1 tablet (50 mg total) by mouth 2 (two) times daily. 07/24/15  Yes Dennie Bible, NP  cetirizine (ZYRTEC) 10 MG tablet Take 10 mg by mouth daily as needed.     Historical Provider, MD  cyclobenzaprine (FLEXERIL) 5 MG tablet Take 1 tablet (5 mg total) by mouth 3 (three) times daily as needed for muscle spasms. 11/02/15   Billy Fischer, MD  diclofenac (CATAFLAM) 50 MG tablet Take 1 tablet (50 mg total) by mouth 3 (three) times daily. For neck pain 11/02/15  Billy Fischer, MD  tadalafil (CIALIS) 20 MG tablet Take 0.5-1 tablets (10-20 mg total) by mouth every other day as needed for erectile dysfunction. 10/28/14   Ria Bush, MD   Meds Ordered and Administered this Visit  Medications - No data to display  BP 145/89 mmHg  Pulse 75  Temp(Src) 98.9 F (37.2 C) (Oral)  Resp 16  SpO2 100% No data found.   Physical Exam  Constitutional: He appears well-developed and well-nourished. No distress.  HENT:  Right Ear: External ear normal.  Left Ear: External ear normal.  Mouth/Throat: Oropharynx is clear and  moist.  Eyes: Conjunctivae and EOM are normal. Pupils are equal, round, and reactive to light.  Neck: Trachea normal. Muscular tenderness present. No spinous process tenderness present. Carotid bruit is not present. No rigidity. Decreased range of motion present. No edema and no erythema present. No Brudzinski's sign and no Kernig's sign noted.    Nursing note and vitals reviewed.   ED Course  Procedures (including critical care time)  Labs Review Labs Reviewed - No data to display  Imaging Review No results found.   Visual Acuity Review  Right Eye Distance:   Left Eye Distance:   Bilateral Distance:    Right Eye Near:   Left Eye Near:    Bilateral Near:         MDM   1. Cervical myofascial strain, initial encounter    Meds ordered this encounter  Medications  . diclofenac (CATAFLAM) 50 MG tablet    Sig: Take 1 tablet (50 mg total) by mouth 3 (three) times daily. For neck pain    Dispense:  30 tablet    Refill:  0  . cyclobenzaprine (FLEXERIL) 5 MG tablet    Sig: Take 1 tablet (5 mg total) by mouth 3 (three) times daily as needed for muscle spasms.    Dispense:  30 tablet    Refill:  0      Billy Fischer, MD 11/02/15 1640

## 2015-11-02 NOTE — ED Notes (Signed)
Report getting ready after getting out of shower when he felt sudden onset sharp right-sided neck pain.  Dull constant ache continues with intermittent sharp pains.  Has tried [employer], Aleve, Icy Hot without relief.  Denies any parasthesias.

## 2015-11-13 ENCOUNTER — Encounter: Payer: BLUE CROSS/BLUE SHIELD | Admitting: Family Medicine

## 2015-11-21 ENCOUNTER — Encounter: Payer: Self-pay | Admitting: Family Medicine

## 2015-11-21 ENCOUNTER — Ambulatory Visit (INDEPENDENT_AMBULATORY_CARE_PROVIDER_SITE_OTHER): Payer: BLUE CROSS/BLUE SHIELD | Admitting: Family Medicine

## 2015-11-21 VITALS — BP 118/84 | HR 84 | Temp 98.3°F | Ht 72.0 in | Wt 169.2 lb

## 2015-11-21 DIAGNOSIS — G40909 Epilepsy, unspecified, not intractable, without status epilepticus: Secondary | ICD-10-CM | POA: Diagnosis not present

## 2015-11-21 DIAGNOSIS — R5382 Chronic fatigue, unspecified: Secondary | ICD-10-CM | POA: Diagnosis not present

## 2015-11-21 DIAGNOSIS — Z0000 Encounter for general adult medical examination without abnormal findings: Secondary | ICD-10-CM

## 2015-11-21 DIAGNOSIS — G43009 Migraine without aura, not intractable, without status migrainosus: Secondary | ICD-10-CM

## 2015-11-21 DIAGNOSIS — N469 Male infertility, unspecified: Secondary | ICD-10-CM | POA: Insufficient documentation

## 2015-11-21 DIAGNOSIS — N529 Male erectile dysfunction, unspecified: Secondary | ICD-10-CM

## 2015-11-21 LAB — CBC WITH DIFFERENTIAL/PLATELET
BASOS ABS: 0 {cells}/uL (ref 0–200)
Basophils Relative: 0 %
EOS PCT: 2 %
Eosinophils Absolute: 148 cells/uL (ref 15–500)
HCT: 45.2 % (ref 38.5–50.0)
Hemoglobin: 15.6 g/dL (ref 13.2–17.1)
LYMPHS PCT: 32 %
Lymphs Abs: 2368 cells/uL (ref 850–3900)
MCH: 29.3 pg (ref 27.0–33.0)
MCHC: 34.5 g/dL (ref 32.0–36.0)
MCV: 84.8 fL (ref 80.0–100.0)
MPV: 9.1 fL (ref 7.5–12.5)
Monocytes Absolute: 814 cells/uL (ref 200–950)
Monocytes Relative: 11 %
NEUTROS ABS: 4070 {cells}/uL (ref 1500–7800)
Neutrophils Relative %: 55 %
PLATELETS: 307 10*3/uL (ref 140–400)
RBC: 5.33 MIL/uL (ref 4.20–5.80)
RDW: 14 % (ref 11.0–15.0)
WBC: 7.4 10*3/uL (ref 3.8–10.8)

## 2015-11-21 LAB — BASIC METABOLIC PANEL
BUN: 22 mg/dL (ref 7–25)
CALCIUM: 9.3 mg/dL (ref 8.6–10.3)
CO2: 23 mmol/L (ref 20–31)
Chloride: 106 mmol/L (ref 98–110)
Creat: 1.18 mg/dL (ref 0.60–1.35)
Glucose, Bld: 81 mg/dL (ref 65–99)
Potassium: 4.1 mmol/L (ref 3.5–5.3)
SODIUM: 140 mmol/L (ref 135–146)

## 2015-11-21 LAB — TSH: TSH: 1.76 m[IU]/L (ref 0.40–4.50)

## 2015-11-21 LAB — VITAMIN B12: Vitamin B-12: 270 pg/mL (ref 200–1100)

## 2015-11-21 MED ORDER — OMEPRAZOLE 40 MG PO CPDR: DELAYED_RELEASE_CAPSULE | ORAL | Status: DC

## 2015-11-21 MED ORDER — TADALAFIL 20 MG PO TABS: 20.0000 mg | ORAL_TABLET | ORAL | Status: DC | PRN

## 2015-11-21 NOTE — Assessment & Plan Note (Addendum)
Doing well on vimpat. No seizure in last 6 months. Appreciate neuro care of patient.

## 2015-11-21 NOTE — Assessment & Plan Note (Addendum)
Chronic, stable. Appreciate neuro care of patient. Continue topamax 100mg  bid and maxalt prn

## 2015-11-21 NOTE — Assessment & Plan Note (Signed)
Offered uro referral for semen analysis, pt will let me know when he desires this.

## 2015-11-21 NOTE — Assessment & Plan Note (Signed)
cialis working well. Refilled, coupon provided today.

## 2015-11-21 NOTE — Addendum Note (Signed)
Addended by: Marchia Bond on: 11/21/2015 04:23 PM   Modules accepted: Miquel Dunn

## 2015-11-21 NOTE — Progress Notes (Signed)
Pre visit review using our clinic review tool, if applicable. No additional management support is needed unless otherwise documented below in the visit note. 

## 2015-11-21 NOTE — Progress Notes (Signed)
BP 118/84 mmHg  Pulse 84  Temp(Src) 98.3 F (36.8 C) (Oral)  Ht 6' (1.829 m)  Wt 169 lb 4 oz (76.771 kg)  BMI 22.95 kg/m2   CC: CPE  Subjective:    Patient ID: Elijah Huang, male    DOB: 19-Nov-1980, 35 y.o.   MRN: [id number]  HPI: Elijah Huang is a 35 y.o. male presenting on 11/21/2015 for Annual Exam   Followed by neurology for seizures and migraines. On vimpat and topamax respectively, also on maxalt abortively. No seizure in last 6 months.   Found tick bite this week. Removed tick, itchy.   Sexually active over last several years with GF, without protection. No pregnancies. GF has conceived in the past, pt has not. Asks about further eval.  Preventative: Flu shot - tries to get yearly Tdap 2012 Seat belt use discussed Sunscreen use discussed. No changing moles on skin.  Caffeine: 1-2 soft drinks/day  Divorced.  Lives with his girlfriend, 1 dog  Occupation: works at [employer]: no regular activity  Diet: good amt water, lots of fried foods, few fruits/vegetables   Relevant past medical, surgical, family and social history reviewed and updated as indicated. Interim medical history since our last visit reviewed. Allergies and medications reviewed and updated. Current Outpatient Prescriptions on File Prior to Visit  Medication Sig  . cetirizine (ZYRTEC) 10 MG tablet Take 10 mg by mouth daily as needed.   . rizatriptan (MAXALT-MLT) 5 MG disintegrating tablet Take 1 tablet (5 mg total) by mouth as needed. May repeat in 2 hours if needed  . topiramate (TOPAMAX) 100 MG tablet One po bid  . VIMPAT 50 MG TABS tablet Take 1 tablet (50 mg total) by mouth 2 (two) times daily.   No current facility-administered medications on file prior to visit.    Review of Systems  Constitutional: Positive for fatigue (ongoing over last 6 months). Negative for fever, chills, activity change, appetite change and unexpected weight change.  HENT: Negative for hearing loss.     Eyes: Negative for visual disturbance.  Respiratory: Negative for cough, chest tightness, shortness of breath and wheezing.   Cardiovascular: Negative for chest pain, palpitations and leg swelling.  Gastrointestinal: Negative for nausea, vomiting, abdominal pain, diarrhea, constipation, blood in stool and abdominal distention.  Genitourinary: Negative for hematuria and difficulty urinating.  Musculoskeletal: Negative for myalgias, arthralgias and neck pain.  Skin: Negative for rash.  Neurological: Negative for dizziness, seizures, syncope and headaches.  Hematological: Negative for adenopathy. Does not bruise/bleed easily.  Psychiatric/Behavioral: Negative for dysphoric mood. The patient is not nervous/anxious.    Per HPI unless specifically indicated in ROS section     Objective:    BP 118/84 mmHg  Pulse 84  Temp(Src) 98.3 F (36.8 C) (Oral)  Ht 6' (1.829 m)  Wt 169 lb 4 oz (76.771 kg)  BMI 22.95 kg/m2  Wt Readings from Last 3 Encounters:  11/21/15 169 lb 4 oz (76.771 kg)  10/29/15 168 lb (76.204 kg)  07/24/15 169 lb 3.2 oz (76.749 kg)    Physical Exam  Constitutional: He is oriented to person, place, and time. He appears well-developed and well-nourished. No distress.  HENT:  Head: Normocephalic and atraumatic.  Right Ear: Hearing, tympanic membrane, external ear and ear canal normal.  Left Ear: Hearing, tympanic membrane, external ear and ear canal normal.  Nose: Nose normal.  Mouth/Throat: Uvula is midline, oropharynx is clear and moist and mucous membranes are normal. No oropharyngeal exudate, posterior  oropharyngeal edema or posterior oropharyngeal erythema.  Eyes: Conjunctivae and EOM are normal. Pupils are equal, round, and reactive to light. No scleral icterus.  Neck: Normal range of motion. Neck supple. No thyromegaly present.  Cardiovascular: Normal rate, regular rhythm, normal heart sounds and intact distal pulses.   No murmur heard. Pulses:      Radial pulses  are 2+ on the right side, and 2+ on the left side.  Pulmonary/Chest: Effort normal and breath sounds normal. No respiratory distress. He has no wheezes. He has no rales.  Abdominal: Soft. Bowel sounds are normal. He exhibits no distension and no mass. There is no tenderness. There is no rebound and no guarding.  Musculoskeletal: Normal range of motion. He exhibits no edema.  Lymphadenopathy:    He has no cervical adenopathy.  Neurological: He is alert and oriented to person, place, and time.  CN grossly intact, station and gait intact  Skin: Skin is warm and dry. No rash noted.  L lateral thigh small tick bite without surrounding erythema or induration, no residual tick parts  Psychiatric: He has a normal mood and affect. His behavior is normal. Judgment and thought content normal.  Nursing note and vitals reviewed.  Results for orders placed or performed during the hospital encounter of 10/21/14  CBC  Result Value Ref Range   WBC 4.8 4.0 - 10.5 K/uL   RBC 5.81 4.22 - 5.81 MIL/uL   Hemoglobin 16.6 13.0 - 17.0 g/dL   HCT 48.2 39.0 - 52.0 %   MCV 83.0 78.0 - 100.0 fL   MCH 28.6 26.0 - 34.0 pg   MCHC 34.4 30.0 - 36.0 g/dL   RDW 13.2 11.5 - 15.5 %   Platelets 277 150 - 400 K/uL  Basic metabolic panel  Result Value Ref Range   Sodium 143 135 - 145 mmol/L   Potassium 3.9 3.5 - 5.1 mmol/L   Chloride 112 96 - 112 mmol/L   CO2 21 19 - 32 mmol/L   Glucose, Bld 113 (H) 70 - 99 mg/dL   BUN 14 6 - 23 mg/dL   Creatinine, Ser 1.23 0.50 - 1.35 mg/dL   Calcium 9.6 8.4 - 10.5 mg/dL   GFR calc non Af Amer 77 (L) >90 mL/min   GFR calc Af Amer 89 (L) >90 mL/min   Anion gap 10 5 - 15  D-dimer, quantitative  Result Value Ref Range   D-Dimer, Quant 0.87 (H) 0.00 - 0.48 ug/mL-FEU  I-stat troponin, ED (not at [facility])  Result Value Ref Range   Troponin i, poc 0.00 0.00 - 0.08 ng/mL   Comment 3              Assessment & Plan:   Problem List Items Addressed This Visit    Healthcare maintenance -  Primary    Preventative protocols reviewed and updated unless pt declined. Discussed healthy diet and lifestyle.       Erectile dysfunction    cialis working well. Refilled, coupon provided today.      Seizure disorder ([place])    Doing well on vimpat. No seizure in last 6 months. Appreciate neuro care of patient.      Relevant Orders   CBC with Differential/Platelet   Basic metabolic panel   Migraine without aura and without status migrainosus, not intractable    Chronic, stable. Appreciate neuro care of patient. Continue topamax 100mg  bid and maxalt prn      Relevant Medications   tadalafil (CIALIS) 20 MG tablet  Infertility male    Offered uro referral for semen analysis, pt will let me know when he desires this.       Other Visit Diagnoses    Chronic fatigue        Relevant Orders    CBC with Differential/Platelet    Basic metabolic panel    TSH    Vitamin B12    VITAMIN D 25 Hydroxy (Vit-D Deficiency, Fractures)        Follow up plan: Return in about 1 year (around 11/20/2016), or as needed, for annual exam, prior fasting for blood work.  Ria Bush, MD

## 2015-11-21 NOTE — Patient Instructions (Addendum)
Lab work today.  Cialis coupon provided today. After tick bite - let us know right away if any fever, new rashes, new joint pains or headache, or abdominal pain or nausea.  You are doing well today Return as needed or in 1 year for next physical.  Health Maintenance, Male A healthy lifestyle and preventative care can promote health and wellness.  Maintain regular health, dental, and eye exams.  Eat a healthy diet. Foods like vegetables, fruits, whole grains, low-fat dairy products, and lean protein foods contain the nutrients you need and are low in calories. Decrease your intake of foods high in solid fats, added sugars, and salt. Get information about a proper diet from your health care provider, if necessary.  Regular physical exercise is one of the most important things you can do for your health. Most adults should get at least 150 minutes of moderate-intensity exercise (any activity that increases your heart rate and causes you to sweat) each week. In addition, most adults need muscle-strengthening exercises on 2 or more days a week.   Maintain a healthy weight. The body mass index (BMI) is a screening tool to identify possible weight problems. It provides an estimate of body fat based on height and weight. Your health care provider can find your BMI and can help you achieve or maintain a healthy weight. For males 20 years and older:  A BMI below 18.5 is considered underweight.  A BMI of 18.5 to 24.9 is normal.  A BMI of 25 to 29.9 is considered overweight.  A BMI of 30 and above is considered obese.  Maintain normal blood lipids and cholesterol by exercising and minimizing your intake of saturated fat. Eat a balanced diet with plenty of fruits and vegetables. Blood tests for lipids and cholesterol should begin at age 62 and be repeated every 5 years. If your lipid or cholesterol levels are high, you are over age 11, or you are at high risk for heart disease, you may need your  cholesterol levels checked more frequently.Ongoing high lipid and cholesterol levels should be treated with medicines if diet and exercise are not working.  If you smoke, find out from your health care provider how to quit. If you do not use tobacco, do not start.  Lung cancer screening is recommended for adults aged 63-80 years who are at high risk for developing lung cancer because of a history of smoking. A yearly low-dose CT scan of the lungs is recommended for people who have at least a 30-pack-year history of smoking and are current smokers or have quit within the past 15 years. A pack year of smoking is smoking an average of 1 pack of cigarettes a day for 1 year (for example, a 30-pack-year history of smoking could mean smoking 1 pack a day for 30 years or 2 packs a day for 15 years). Yearly screening should continue until the smoker has stopped smoking for at least 15 years. Yearly screening should be stopped for people who develop a health problem that would prevent them from having lung cancer treatment.  If you choose to drink alcohol, do not have more than 2 drinks per day. One drink is considered to be 12 oz (360 mL) of beer, 5 oz (150 mL) of wine, or 1.5 oz (45 mL) of liquor.  Avoid the use of street drugs. Do not share needles with anyone. Ask for help if you need support or instructions about stopping the use of drugs.  High blood  pressure causes heart disease and increases the risk of stroke. High blood pressure is more likely to develop in:  People who have blood pressure in the end of the normal range (100-139/85-89 mm Hg).  People who are overweight or obese.  People who are African American.  If you are 74-9 years of age, have your blood pressure checked every 3-5 years. If you are 34 years of age or older, have your blood pressure checked every year. You should have your blood pressure measured twice--once when you are at a hospital or clinic, and once when you are not at a  hospital or clinic. Record the average of the two measurements. To check your blood pressure when you are not at a hospital or clinic, you can use:  An automated blood pressure machine at a pharmacy.  A home blood pressure monitor.  If you are 59-30 years old, ask your health care provider if you should take aspirin to prevent heart disease.  Diabetes screening involves taking a blood sample to check your fasting blood sugar level. This should be done once every 3 years after age 39 if you are at a normal weight and without risk factors for diabetes. Testing should be considered at a younger age or be carried out more frequently if you are overweight and have at least 1 risk factor for diabetes.  Colorectal cancer can be detected and often prevented. Most routine colorectal cancer screening begins at the age of 28 and continues through age 36. However, your health care provider may recommend screening at an earlier age if you have risk factors for colon cancer. On a yearly basis, your health care provider may provide home test kits to check for hidden blood in the stool. A small camera at the end of a tube may be used to directly examine the colon (sigmoidoscopy or colonoscopy) to detect the earliest forms of colorectal cancer. Talk to your health care provider about this at age 25 when routine screening begins. A direct exam of the colon should be repeated every 5-10 years through age 70, unless early forms of precancerous polyps or small growths are found.  People who are at an increased risk for hepatitis B should be screened for this virus. You are considered at high risk for hepatitis B if:  You were born in a country where hepatitis B occurs often. Talk with your health care provider about which countries are considered high risk.  Your parents were born in a high-risk country and you have not received a shot to protect against hepatitis B (hepatitis B vaccine).  You have HIV or AIDS.  You  use needles to inject street drugs.  You live with, or have sex with, someone who has hepatitis B.  You are a man who has sex with other men (MSM).  You get hemodialysis treatment.  You take certain medicines for conditions like cancer, organ transplantation, and autoimmune conditions.  Hepatitis C blood testing is recommended for all people born from [birth year] through 1963 and any individual with known risk factors for hepatitis C.  Healthy men should no longer receive prostate-specific antigen (PSA) blood tests as part of routine cancer screening. Talk to your health care provider about prostate cancer screening.  Testicular cancer screening is not recommended for adolescents or adult males who have no symptoms. Screening includes self-exam, a health care provider exam, and other screening tests. Consult with your health care provider about any symptoms you have or any concerns you  have about testicular cancer.  Practice safe sex. Use condoms and avoid high-risk sexual practices to reduce the spread of sexually transmitted infections (STIs).  You should be screened for STIs, including gonorrhea and chlamydia if:  You are sexually active and are younger than 24 years.  You are older than 24 years, and your health care provider tells you that you are at risk for this type of infection.  Your sexual activity has changed since you were last screened, and you are at an increased risk for chlamydia or gonorrhea. Ask your health care provider if you are at risk.  If you are at risk of being infected with HIV, it is recommended that you take a prescription medicine daily to prevent HIV infection. This is called pre-exposure prophylaxis (PrEP). You are considered at risk if:  You are a man who has sex with other men (MSM).  You are a heterosexual man who is sexually active with multiple partners.  You take drugs by injection.  You are sexually active with a partner who has HIV.  Talk with  your health care provider about whether you are at high risk of being infected with HIV. If you choose to begin PrEP, you should first be tested for HIV. You should then be tested every 3 months for as long as you are taking PrEP.  Use sunscreen. Apply sunscreen liberally and repeatedly throughout the day. You should seek shade when your shadow is shorter than you. Protect yourself by wearing long sleeves, pants, a wide-brimmed hat, and sunglasses year round whenever you are outdoors.  Tell your health care provider of new moles or changes in moles, especially if there is a change in shape or color. Also, tell your health care provider if a mole is larger than the size of a pencil eraser.  A one-time screening for abdominal aortic aneurysm (AAA) and surgical repair of large AAAs by ultrasound is recommended for men aged 30-75 years who are current or former smokers.  Stay current with your vaccines (immunizations).   This information is not intended to replace advice given to you by your health care provider. Make sure you discuss any questions you have with your health care provider.   Document Released: 12/25/2007 Document Revised: 07/19/2014 Document Reviewed: 11/23/2010 Elsevier Interactive Patient Education [employer].

## 2015-11-21 NOTE — Assessment & Plan Note (Signed)
Preventative protocols reviewed and updated unless pt declined. Discussed healthy diet and lifestyle.  

## 2015-11-22 ENCOUNTER — Other Ambulatory Visit: Payer: Self-pay | Admitting: Family Medicine

## 2015-11-22 LAB — VITAMIN D 25 HYDROXY (VIT D DEFICIENCY, FRACTURES): VIT D 25 HYDROXY: 27 ng/mL — AB (ref 30–100)

## 2015-11-22 MED ORDER — VITAMIN D3 25 MCG (1000 UT) PO CAPS: 1.0000 | ORAL_CAPSULE | Freq: Every day | ORAL | Status: AC

## 2015-11-22 MED ORDER — CYANOCOBALAMIN 500 MCG PO TABS: 500.0000 ug | ORAL_TABLET | Freq: Every day | ORAL | Status: DC

## 2015-12-18 ENCOUNTER — Telehealth: Payer: Self-pay | Admitting: *Deleted

## 2015-12-18 MED ORDER — VIMPAT 50 MG PO TABS: 50.0000 mg | ORAL_TABLET | Freq: Two times a day (BID) | ORAL | Status: DC

## 2015-12-18 NOTE — Telephone Encounter (Signed)
Fax confirmation received. [email or web address]

## 2015-12-18 NOTE — Telephone Encounter (Signed)
Fax incoming for refill on [place] [phone],  [phone]

## 2016-02-06 ENCOUNTER — Encounter: Payer: Self-pay | Admitting: *Deleted

## 2016-02-06 ENCOUNTER — Encounter: Payer: Self-pay | Admitting: Family Medicine

## 2016-02-06 ENCOUNTER — Ambulatory Visit (INDEPENDENT_AMBULATORY_CARE_PROVIDER_SITE_OTHER): Payer: BLUE CROSS/BLUE SHIELD | Admitting: Family Medicine

## 2016-02-06 VITALS — BP 122/92 | HR 110 | Temp 99.1°F | Wt 169.0 lb

## 2016-02-06 DIAGNOSIS — H10023 Other mucopurulent conjunctivitis, bilateral: Secondary | ICD-10-CM

## 2016-02-06 DIAGNOSIS — J020 Streptococcal pharyngitis: Secondary | ICD-10-CM | POA: Diagnosis not present

## 2016-02-06 DIAGNOSIS — R509 Fever, unspecified: Secondary | ICD-10-CM

## 2016-02-06 DIAGNOSIS — J029 Acute pharyngitis, unspecified: Secondary | ICD-10-CM

## 2016-02-06 LAB — POC INFLUENZA A&B (BINAX/QUICKVUE)
Influenza A, POC: NEGATIVE
Influenza B, POC: NEGATIVE

## 2016-02-06 LAB — POCT RAPID STREP A (OFFICE): RAPID STREP A SCREEN: POSITIVE — AB

## 2016-02-06 MED ORDER — AMOXICILLIN 500 MG PO CAPS: 500.0000 mg | ORAL_CAPSULE | Freq: Two times a day (BID) | ORAL | 0 refills | Status: DC

## 2016-02-06 NOTE — Assessment & Plan Note (Addendum)
Discussed supportive care including cool compresses and lubricating eye drops.

## 2016-02-06 NOTE — Assessment & Plan Note (Signed)
Treat with amoxicillin sent to pharmacy. Supportive care as per instructions. Update if not improving with treatment.  Out of work x 24 hours, hopeful to return on Monday.

## 2016-02-06 NOTE — Progress Notes (Signed)
BP (!) 122/92   Pulse (!) 110   Temp 99.1 F (37.3 C) (Oral)   Wt 169 lb (76.7 kg)   SpO2 96%   BMI 22.92 kg/m    CC: congestion, ST, body aches Subjective:    Patient ID: [name], male    DOB: Apr 07, 1981, 35 y.o.   MRN: [id number]  HPI: RYA ROEL is a 35 y.o. male presenting on 02/06/2016 for Cough (congestion,ST,body aches, eyes have discharge, mild fever; x 2-3 days)   3d h/o ST, body aches. Yesterday morning mild congestion, L eye red, matted with discharge. This morning both eyes matted with discharge and redness. Tmax 100.4. Some headaches.   No significant cough. No PNdrainage. No ear or tooth pain. No abd pain or nausea.   GF with ST recently.  Not around smokers.  No h/o asthma.   Last seizure was 1.5 yrs ago. Compliant with topamax and vimpat.   Relevant past medical, surgical, family and social history reviewed and updated as indicated. Interim medical history since our last visit reviewed. Allergies and medications reviewed and updated. Current Outpatient Prescriptions on File Prior to Visit  Medication Sig  . cetirizine (ZYRTEC) 10 MG tablet Take 10 mg by mouth daily as needed.   . Cholecalciferol (VITAMIN D3) 1000 units CAPS Take 1 capsule (1,000 Units total) by mouth daily.  . cyanocobalamin (V-R VITAMIN B-12) 500 MCG tablet Take 1 tablet (500 mcg total) by mouth daily.  Marland Kitchen omeprazole (PRILOSEC) 40 MG capsule TAKE ONE CAPSULE BY MOUTH EVERY MONDAY WEDNESDAY  AND FRIDAY  . rizatriptan (MAXALT-MLT) 5 MG disintegrating tablet Take 1 tablet (5 mg total) by mouth as needed. May repeat in 2 hours if needed  . tadalafil (CIALIS) 20 MG tablet Take 1 tablet (20 mg total) by mouth every other day as needed for erectile dysfunction.  . topiramate (TOPAMAX) 100 MG tablet One po bid  . VIMPAT 50 MG TABS tablet Take 1 tablet (50 mg total) by mouth 2 (two) times daily.   No current facility-administered medications on file prior to visit.     Review of  Systems Per HPI unless specifically indicated in ROS section     Objective:    BP (!) 122/92   Pulse (!) 110   Temp 99.1 F (37.3 C) (Oral)   Wt 169 lb (76.7 kg)   SpO2 96%   BMI 22.92 kg/m   Wt Readings from Last 3 Encounters:  02/06/16 169 lb (76.7 kg)  11/21/15 169 lb 4 oz (76.8 kg)  10/29/15 168 lb (76.2 kg)    Physical Exam  Constitutional: He appears well-developed and well-nourished. No distress.  HENT:  Head: Normocephalic and atraumatic.  Right Ear: Hearing, tympanic membrane, external ear and ear canal normal.  Left Ear: Hearing, tympanic membrane, external ear and ear canal normal.  Nose: Nose normal. No mucosal edema or rhinorrhea. Right sinus exhibits no maxillary sinus tenderness and no frontal sinus tenderness. Left sinus exhibits no maxillary sinus tenderness and no frontal sinus tenderness.  Mouth/Throat: Uvula is midline and mucous membranes are normal. Posterior oropharyngeal edema and posterior oropharyngeal erythema present. No oropharyngeal exudate or tonsillar abscesses.  Nasal mucosal congestion Oropharyngeal erythema  Eyes: Conjunctivae and EOM are normal. Pupils are equal, round, and reactive to light. No scleral icterus.  Neck: Normal range of motion. Neck supple.  Cardiovascular: Normal rate, regular rhythm, normal heart sounds and intact distal pulses.   No murmur heard. Pulmonary/Chest: Effort normal and breath sounds  normal. No respiratory distress. He has no wheezes. He has no rales.  Lymphadenopathy:    He has cervical adenopathy (shotty).  Skin: Skin is warm and dry. No rash noted.  Nursing note and vitals reviewed.  Results for orders placed or performed in visit on 02/06/16  POCT rapid strep A  Result Value Ref Range   Rapid Strep A Screen Positive (A) Negative  POC Influenza A&B (Binax test)  Result Value Ref Range   Influenza A, POC Negative Negative   Influenza B, POC Negative Negative      Assessment & Plan:   Problem List  Items Addressed This Visit    Pink eye disease of both eyes    Discussed supportive care including cool compresses and lubricating eye drops.       Streptococcal pharyngitis - Primary    Treat with amoxicillin sent to pharmacy. Supportive care as per instructions. Update if not improving with treatment.  Out of work x 24 hours, hopeful to return on Monday.        Other Visit Diagnoses    Fever, unspecified       Relevant Orders   POCT rapid strep A (Completed)   POC Influenza A&B (Binax test) (Completed)   Sore throat       Relevant Orders   POCT rapid strep A (Completed)   POC Influenza A&B (Binax test) (Completed)       Follow up plan: Return if symptoms worsen or fail to improve.  Ria Bush, MD

## 2016-02-06 NOTE — Progress Notes (Signed)
Pre visit review using our clinic review tool, if applicable. No additional management support is needed unless otherwise documented below in the visit note. 

## 2016-02-06 NOTE — Patient Instructions (Signed)
You have strep throat as well as pink eye. Treat with amoxicillin twice daily for 10 days. Push fluids and rest. May use ibuprofen 400mg  twice daily with meals for pain/inflammation in throat.  Let us know if not improving as expected or if persistent fever despite treatment, or worsening productive cough.  Out of work tomorrow.    Strep Throat Strep throat is a bacterial infection of the throat. Your health care provider may call the infection tonsillitis or pharyngitis, depending on whether there is swelling in the tonsils or at the back of the throat. Strep throat is most common during the cold months of the year in children who are 23-64 years of age, but it can happen during any season in people of any age. This infection is spread from person to person (contagious) through coughing, sneezing, or close contact. CAUSES Strep throat is caused by the bacteria called Streptococcus pyogenes. RISK FACTORS This condition is more likely to develop in:  People who spend time in crowded places where the infection can spread easily.  People who have close contact with someone who has strep throat. SYMPTOMS Symptoms of this condition include:  Fever or chills.   Redness, swelling, or pain in the tonsils or throat.  Pain or difficulty when swallowing.  White or yellow spots on the tonsils or throat.  Swollen, tender glands in the neck or under the jaw.  Red rash all over the body (rare). DIAGNOSIS This condition is diagnosed by performing a rapid strep test or by taking a swab of your throat (throat culture test). Results from a rapid strep test are usually ready in a few minutes, but throat culture test results are available after one or two days. TREATMENT This condition is treated with antibiotic medicine. HOME CARE INSTRUCTIONS Medicines  Take over-the-counter and prescription medicines only as told by your health care provider.  Take your antibiotic as told by your health care  provider. Do not stop taking the antibiotic even if you start to feel better.  Have family members who also have a sore throat or fever tested for strep throat. They may need antibiotics if they have the strep infection. Eating and Drinking  Do not share food, drinking cups, or personal items that could cause the infection to spread to other people.  If swallowing is difficult, try eating soft foods until your sore throat feels better.  Drink enough fluid to keep your urine clear or pale yellow. General Instructions  Gargle with a salt-water mixture 3-4 times per day or as needed. To make a salt-water mixture, completely dissolve -1 tsp of salt in 1 cup of warm water.  Make sure that all household members wash their hands well.  Get plenty of rest.  Stay home from school or work until you have been taking antibiotics for 24 hours.  Keep all follow-up visits as told by your health care provider. This is important. SEEK MEDICAL CARE IF:  The glands in your neck continue to get bigger.  You develop a rash, cough, or earache.  You cough up a thick liquid that is green, yellow-brown, or bloody.  You have pain or discomfort that does not get better with medicine.  Your problems seem to be getting worse rather than better.  You have a fever. SEEK IMMEDIATE MEDICAL CARE IF:  You have new symptoms, such as vomiting, severe headache, stiff or painful neck, chest pain, or shortness of breath.  You have severe throat pain, drooling, or changes in  your voice.  You have swelling of the neck, or the skin on the neck becomes red and tender.  You have signs of dehydration, such as fatigue, dry mouth, and decreased urination.  You become increasingly sleepy, or you cannot wake up completely.  Your joints become red or painful.   This information is not intended to replace advice given to you by your health care provider. Make sure you discuss any questions you have with your health  care provider.   Document Released: 06/25/2000 Document Revised: 03/19/2015 Document Reviewed: 10/21/2014 Elsevier Interactive Patient Education [employer].

## 2016-02-18 ENCOUNTER — Telehealth: Payer: Self-pay | Admitting: Family Medicine

## 2016-02-18 NOTE — Telephone Encounter (Signed)
Pt called to let you know he stated once he went off antibiotics he started getting worse  What does he need to do  [place] [facility]

## 2016-02-19 MED ORDER — AZITHROMYCIN 250 MG PO TABS: ORAL_TABLET | ORAL | 0 refills | Status: DC

## 2016-02-19 NOTE — Telephone Encounter (Signed)
plz triage patient - what symptoms is he having?

## 2016-02-19 NOTE — Telephone Encounter (Signed)
Ensure no white film on throat/tongue.  Have sent in zpack antibiotic. Come in if no better after treatment.

## 2016-02-19 NOTE — Telephone Encounter (Signed)
Finished abx on Sunday. Still has ST. Hurts to swallow and low grade temp. Ibuprofen not much help. No trouble drinking but making it hard to eat due to pain.

## 2016-02-20 ENCOUNTER — Encounter: Payer: Self-pay | Admitting: Family Medicine

## 2016-02-20 ENCOUNTER — Ambulatory Visit (INDEPENDENT_AMBULATORY_CARE_PROVIDER_SITE_OTHER): Payer: BLUE CROSS/BLUE SHIELD | Admitting: Family Medicine

## 2016-02-20 VITALS — BP 124/80 | HR 100 | Temp 98.4°F | Wt 173.0 lb

## 2016-02-20 DIAGNOSIS — J020 Streptococcal pharyngitis: Secondary | ICD-10-CM | POA: Diagnosis not present

## 2016-02-20 DIAGNOSIS — H10023 Other mucopurulent conjunctivitis, bilateral: Secondary | ICD-10-CM

## 2016-02-20 MED ORDER — POLYMYXIN B-TRIMETHOPRIM 10000-0.1 UNIT/ML-% OP SOLN: 2.0000 [drp] | OPHTHALMIC | 0 refills | Status: DC

## 2016-02-20 NOTE — Progress Notes (Signed)
BP 124/80   Pulse 100   Temp 98.4 F (36.9 C) (Oral)   Wt 173 lb (78.5 kg)   BMI 23.46 kg/m    CC: conjunctivitis Subjective:    Patient ID: Elijah Huang, male    DOB: 07/23/1980, 35 y.o.   MRN: [id number]  HPI: ELDIE Huang is a 35 y.o. male presenting on 02/20/2016 for Conjunctivitis   See prior note for details. Dx with streptococcal pharyngitis with pink eye 02/06/2016, treated with amoxicilin 500mg  bid 10d course. Completed abx, and sxs started recurring - low grade fever, sore throat not improved with ibuprofen. Yesterday evening L eye started getting irritated again - red and matted with pain. Tmax last night 99.1. Dry cough present as well as mild PNDrainage. Some L neck and shoulder ache.   No other body aches, headaches, vision changes, ear or tooth pain.  No other sick contacts at home.  Ibuprofen 400mg  bid with meals not helping.   Relevant past medical, surgical, family and social history reviewed and updated as indicated. Interim medical history since our last visit reviewed. Allergies and medications reviewed and updated. Current Outpatient Prescriptions on File Prior to Visit  Medication Sig  . cetirizine (ZYRTEC) 10 MG tablet Take 10 mg by mouth daily as needed.   . Cholecalciferol (VITAMIN D3) 1000 units CAPS Take 1 capsule (1,000 Units total) by mouth daily.  . cyanocobalamin (V-R VITAMIN B-12) 500 MCG tablet Take 1 tablet (500 mcg total) by mouth daily.  Marland Kitchen omeprazole (PRILOSEC) 40 MG capsule TAKE ONE CAPSULE BY MOUTH EVERY MONDAY WEDNESDAY  AND FRIDAY  . rizatriptan (MAXALT-MLT) 5 MG disintegrating tablet Take 1 tablet (5 mg total) by mouth as needed. May repeat in 2 hours if needed  . tadalafil (CIALIS) 20 MG tablet Take 1 tablet (20 mg total) by mouth every other day as needed for erectile dysfunction.  . topiramate (TOPAMAX) 100 MG tablet One po bid  . VIMPAT 50 MG TABS tablet Take 1 tablet (50 mg total) by mouth 2 (two) times daily.   No current  facility-administered medications on file prior to visit.     Review of Systems Per HPI unless specifically indicated in ROS section     Objective:    BP 124/80   Pulse 100   Temp 98.4 F (36.9 C) (Oral)   Wt 173 lb (78.5 kg)   BMI 23.46 kg/m   Wt Readings from Last 3 Encounters:  02/20/16 173 lb (78.5 kg)  02/06/16 169 lb (76.7 kg)  11/21/15 169 lb 4 oz (76.8 kg)    Physical Exam  Constitutional: He appears well-developed and well-nourished. No distress.  HENT:  Head: Normocephalic and atraumatic.  Right Ear: Hearing, tympanic membrane, external ear and ear canal normal.  Left Ear: Hearing, tympanic membrane, external ear and ear canal normal.  Nose: Mucosal edema (nasal mucosal erythema) present. No rhinorrhea. Right sinus exhibits no maxillary sinus tenderness and no frontal sinus tenderness. Left sinus exhibits no maxillary sinus tenderness and no frontal sinus tenderness.  Mouth/Throat: Uvula is midline and mucous membranes are normal. Posterior oropharyngeal edema and posterior oropharyngeal erythema present. No oropharyngeal exudate or tonsillar abscesses.  Eyes: EOM are normal. Pupils are equal, round, and reactive to light. Left eye exhibits discharge. Right conjunctiva is injected. Left conjunctiva is injected. No scleral icterus.    Palpebral and bulbar erythema and injection throughout L>R conjunctiva, erythema present at L limbus as well  Neck: Normal range of motion. Neck supple.  Cardiovascular: Normal rate, regular rhythm, normal heart sounds and intact distal pulses.   No murmur heard. Pulmonary/Chest: Effort normal and breath sounds normal. No respiratory distress. He has no wheezes. He has no rales.  Lymphadenopathy:    He has no cervical adenopathy.  Skin: Skin is warm and dry. No rash noted.  Nursing note and vitals reviewed.     Assessment & Plan:   Problem List Items Addressed This Visit    Pink eye disease of both eyes    Recurrent conjunctivitis  - treat with antibiotic eye drops sent to pharmacy. Handout provided as well.       Streptococcal pharyngitis - Primary    Persistent pharyngitis despite 10d amox 500mg  bid course. Will broaden to zpack antibiotic. Increase ibuprofen to 600mg  TID AC.  Update if not improved with this treatment.       Relevant Medications   azithromycin (ZITHROMAX) 250 MG tablet    Other Visit Diagnoses   None.      Follow up plan: Return if symptoms worsen or fail to improve.  Elijah Bush, MD

## 2016-02-20 NOTE — Progress Notes (Signed)
Pre visit review using our clinic review tool, if applicable. No additional management support is needed unless otherwise documented below in the visit note. 

## 2016-02-20 NOTE — Patient Instructions (Signed)
For persistent pharyngitis - treat with zpack antibiotic sent to pharmacy.  For persistent conjunctivitis - treat with eye drops sent to pharmacy. Continue ibuprofen 600mg  (3 tablets) 2-3 times daily with meals.   Bacterial Conjunctivitis Bacterial conjunctivitis, commonly called pink eye, is an inflammation of the clear membrane that covers the white part of the eye (conjunctiva). The inflammation can also happen on the underside of the eyelids. The blood vessels in the conjunctiva become inflamed, causing the eye to become red or pink. Bacterial conjunctivitis may spread easily from one eye to another and from person to person (contagious).  CAUSES  Bacterial conjunctivitis is caused by bacteria. The bacteria may come from your own skin, your upper respiratory tract, or from someone else with bacterial conjunctivitis. SYMPTOMS  The normally white color of the eye or the underside of the eyelid is usually pink or red. The pink eye is usually associated with irritation, tearing, and some sensitivity to light. Bacterial conjunctivitis is often associated with a thick, yellowish discharge from the eye. The discharge may turn into a crust on the eyelids overnight, which causes your eyelids to stick together. If a discharge is present, there may also be some blurred vision in the affected eye. DIAGNOSIS  Bacterial conjunctivitis is diagnosed by your caregiver through an eye exam and the symptoms that you report. Your caregiver looks for changes in the surface tissues of your eyes, which may point to the specific type of conjunctivitis. A sample of any discharge may be collected on a cotton-tip swab if you have a severe case of conjunctivitis, if your cornea is affected, or if you keep getting repeat infections that do not respond to treatment. The sample will be sent to a lab to see if the inflammation is caused by a bacterial infection and to see if the infection will respond to antibiotic  medicines. TREATMENT   Bacterial conjunctivitis is treated with antibiotics. Antibiotic eyedrops are most often used. However, antibiotic ointments are also available. Antibiotics pills are sometimes used. Artificial tears or eye washes may ease discomfort. HOME CARE INSTRUCTIONS   To ease discomfort, apply a cool, clean washcloth to your eye for 10-20 minutes, 3-4 times a day.  Gently wipe away any drainage from your eye with a warm, wet washcloth or a cotton ball.  Wash your hands often with soap and water. Use paper towels to dry your hands.  Do not share towels or washcloths. This may spread the infection.  Change or wash your pillowcase every day.  You should not use eye makeup until the infection is gone.  Do not operate machinery or drive if your vision is blurred.  Stop using contact lenses. Ask your caregiver how to sterilize or replace your contacts before using them again. This depends on the type of contact lenses that you use.  When applying medicine to the infected eye, do not touch the edge of your eyelid with the eyedrop bottle or ointment tube. SEEK IMMEDIATE MEDICAL CARE IF:   Your infection has not improved within 3 days after beginning treatment.  You had yellow discharge from your eye and it returns.  You have increased eye pain.  Your eye redness is spreading.  Your vision becomes blurred.  You have a fever or persistent symptoms for more than 2-3 days.  You have a fever and your symptoms suddenly get worse.  You have facial pain, redness, or swelling. MAKE SURE YOU:   Understand these instructions.  Will watch your condition.  Will get help right away if you are not doing well or get worse.   This information is not intended to replace advice given to you by your health care provider. Make sure you discuss any questions you have with your health care provider.   Document Released: 06/28/2005 Document Revised: 07/19/2014 Document Reviewed:  11/29/2011 Elsevier Interactive Patient Education [employer].

## 2016-02-20 NOTE — Telephone Encounter (Signed)
Coming in today

## 2016-02-20 NOTE — Assessment & Plan Note (Signed)
Recurrent conjunctivitis - treat with antibiotic eye drops sent to pharmacy. Handout provided as well.

## 2016-02-20 NOTE — Assessment & Plan Note (Signed)
Persistent pharyngitis despite 10d amox 500mg  bid course. Will broaden to zpack antibiotic. Increase ibuprofen to 600mg  TID AC.  Update if not improved with this treatment.

## 2016-04-28 ENCOUNTER — Ambulatory Visit: Payer: BLUE CROSS/BLUE SHIELD | Admitting: Nurse Practitioner

## 2016-05-11 ENCOUNTER — Ambulatory Visit (INDEPENDENT_AMBULATORY_CARE_PROVIDER_SITE_OTHER): Payer: BLUE CROSS/BLUE SHIELD | Admitting: Nurse Practitioner

## 2016-05-11 ENCOUNTER — Encounter: Payer: Self-pay | Admitting: Nurse Practitioner

## 2016-05-11 VITALS — BP 124/79 | HR 85 | Ht 72.0 in | Wt 173.8 lb

## 2016-05-11 DIAGNOSIS — G43009 Migraine without aura, not intractable, without status migrainosus: Secondary | ICD-10-CM

## 2016-05-11 DIAGNOSIS — G40909 Epilepsy, unspecified, not intractable, without status epilepticus: Secondary | ICD-10-CM

## 2016-05-11 MED ORDER — VIMPAT 50 MG PO TABS: 50.0000 mg | ORAL_TABLET | Freq: Two times a day (BID) | ORAL | 5 refills | Status: DC

## 2016-05-11 NOTE — Progress Notes (Signed)
Fax confirmation received vimpat,[facility].

## 2016-05-11 NOTE — Progress Notes (Signed)
[facility]  PATIENT: Elijah Huang DOB: [birth year]   REASON FOR VISIT: Follow-up for seizure disorder and migraine HISTORY FROM: Patient    HISTORY OF PRESENT ILLNESS: HISTORY [name] is a 35 years old right-handed Caucasian male, accompanied by his girlfriend, referred by his primary care physician Dr. Danise Mina for evaluation of possible seizure, with his girl friend AprilHe had past medical history of GERD, erectile dysfunction He presented with seizure-like event, the initial episode was witness by his girlfriend, Dec 07 2013, after waking up talking with his girlfriend for a while, shortly afterwards, he was noticed to lie on his left side, body shaking, loss of consciousness, lasting for a few minutes, with tongue biting, lip numbness, next day, in May 30th 2015, while taking a shower, his girlfriend heard a loud thundering, he was sitting in the shower, confused, did not know what has happened, no self injury, He denied previous history of seizure, he presented to the emergency room Dec 09 2013, with constellation of complaints, intermittent chest pain, blurry vision, left eye pain, lightheadedness, He had a CAT scan of the brain that was normal, laboratory showed normal CBC, CMP, negative troponin, 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 Sept 24th 2015: At work, he became  unresponsive to his manager, staring, hand tremor, mild urinary incontinence He has no collection of hte events, no warning signs,  He noticed side effects on Depakote this includes weight gain, increased bilateral hands tremor , Depakote was stop in Nov 2015, started Keppra  UPDATE October 30 2014:He is doing well with keppra 1500mg  bid , Topamax 100mg  bid, no recurrent seizure, rarely has headache, Maxalt was helpful.  UPDATE 04/30/2015 [name], 35 year old male returns for follow-up he has been seen in the past by Dr. Krista Blue in our clinic . He has a history of generalized seizure disorder and migraine headaches. He is currently on Keppra 1500 mg twice daily and Topamax 100mg  twice daily. Last seizure was 05/22/2014. He continues to have an occasional headache which is usually relieved with Maxalt. He needs refills on Maxalt. He is not aware of any food triggers. He returns for reevaluation  UPDATE 07/24/2015. CMMr. [name], 35 year old male returns for follow-up. He has a history of seizure disorder and migraines. His migraines are in excellent control on Topamax 100 twice daily and Maxalt acutely. His last seizure event occurred in November 2015. He was last seen in the office 10/6/ 2016. At that time he was doing well on Keppra however he called in to the office several weeks ago with increased irritability and tiredness on the drug. He returns today to talk about changing his seizure medications. He has previously been on Depakote with continued seizures. His previous seizure episodes are described as his body gets stiff with jerking of the extremities and loss of consciousness. These have been witnessed by his girlfriend in the past.  He returns for reevaluation UPDATE 04/19/2017CM [name], 35 year old male returns for follow-up he was last seen in the office 07/24/2015. He has history of migraines that are in excellent control with Topamax also has a history of seizure disorder some increased irritability  and tiredness on Keppra reported at last visit. He was successfully switched to Vimpat and has tapered off of Keppra without difficulty. He claims he feels much better. He returns for reevaluation no further seizure activity. UPDATE 10/31/2017CM [name], 35 year old male returns for follow-up. He has a history of seizure disorder with last seizure event occurring in November 2015. He is currently on Vimpat. He has history of migraines and is on Topamax 100 mg twice daily Maxalt acutely. His headaches are in excellent control and he rarely uses Maxalt. He just got tired by the [facility]. He returns for reevaluation  REVIEW OF SYSTEMS: Full 14 system review of systems performed and notable only for those listed, all others are neg:  Constitutional: neg  Cardiovascular: neg Ear/Nose/Throat: neg  Skin: neg Eyes: neg Respiratory: neg Gastroitestinal: neg  Hematology/Lymphatic: neg  Endocrine: neg Musculoskeletal:neg Allergy/Immunology: neg Neurological: neg Psychiatric: neg Sleep : neg   ALLERGIES: No Known Allergies  HOME MEDICATIONS: Outpatient Medications Prior to Visit  Medication Sig Dispense Refill  . cetirizine (ZYRTEC) 10 MG tablet Take 10 mg by mouth daily as needed.     . Cholecalciferol (VITAMIN D3) 1000 units CAPS Take 1 capsule (1,000 Units total) by mouth daily. 30 capsule   . cyanocobalamin (V-R VITAMIN B-12) 500 MCG tablet Take 1 tablet (500 mcg total) by mouth daily.    Marland Kitchen omeprazole (PRILOSEC) 40 MG capsule TAKE ONE CAPSULE BY MOUTH EVERY MONDAY WEDNESDAY  AND FRIDAY 30 capsule 11  . rizatriptan (MAXALT-MLT) 5 MG disintegrating tablet Take 1 tablet (5 mg total) by mouth as needed. May repeat in 2 hours if needed 15 tablet 6  . tadalafil (CIALIS) 20 MG tablet Take 1 tablet (20 mg total) by mouth every other day as needed for erectile dysfunction. 6 tablet 11  . topiramate (TOPAMAX) 100 MG tablet One po bid 180 tablet 3  . VIMPAT 50 MG TABS tablet Take 1 tablet  (50 mg total) by mouth 2 (two) times daily. 60 tablet 5  . azithromycin (ZITHROMAX) 250 MG tablet Take 250 mg by mouth daily. Take 2 tablets on day one and one tablet on days 2-5    . trimethoprim-polymyxin b (POLYTRIM) ophthalmic solution Place 2 drops into the left eye every 4 (four) hours. For 5 days (Patient not taking: Reported on 05/11/2016) 10 mL 0   No facility-administered medications prior to visit.     PAST MEDICAL HISTORY: Past Medical History:  Diagnosis Date  . 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 GERD (gastroesophageal reflux disease)   . History of chicken pox   . Seizure disorder ([place]) 2015   presumed partial seizures, started on depakote, rec no driving for 6 mo ([id number], Dr Addison Lank)  . Seizures ([place])     PAST SURGICAL HISTORY: Past Surgical History:  Procedure Laterality Date  . TONSILLECTOMY  1989    FAMILY HISTORY: Family History  Problem Relation Age of Onset  . Diabetes Father   . Coronary artery disease Father 76    cabg 20, nonsmoker, obese  . Cancer Father 53    prostate  . Coronary artery disease Paternal Grandfather 4    deceased from same  .  Cancer Maternal Grandfather     pancreatic  . Stroke Neg Hx     SOCIAL HISTORY: Social History   Social History  . Marital status: Married    Spouse name: N/A  . Number of children: 0  . Years of education: 12   Occupational History  . [employer]--works in mailroom    Social History Main Topics  . Smoking status: Never Smoker  . Smokeless tobacco: Former Systems developer    Types: Snuff     Comment: occasional snuff use per chart  . Alcohol use 0.0 oz/week     Comment: occasional  . Drug use: No  . Sexual activity: Not on file   Other Topics Concern  . Not on file   Social History Narrative   Caffeine: 1-2 soft drinks/day   Divorced    Lives with his girlfriend  1 dog   Occupation: works at [employer]: no regular  activity   Diet: good amt water, lots of fried foods, few fruits/vegetables.     PHYSICAL EXAM  Vitals:   05/11/16 1242  BP: 124/79  Pulse: 85  Weight: 173 lb 12.8 oz (78.8 kg)  Height: 6' (1.829 m)   Body mass index is 23.57 kg/m. Generalized: Well developed, in no acute distress  Head: normocephalic and atraumatic,. Oropharynx benign  Neck: Supple, no carotid bruits  Cardiac: Regular rate rhythm, no murmur  Musculoskeletal: No deformity   Neurological examination   Mentation: Alert oriented to time, place, history taking. Attention span and concentration appropriate. Recent and remote memory intact. Follows all commands speech and language fluent.   Cranial nerve II-XII: Pupils were equal round reactive to light extraocular movements were full, visual field were full on confrontational test. Facial sensation and strength were normal. hearing was intact to finger rubbing bilaterally. Uvula tongue midline. head turning and shoulder shrug were normal and symmetric.Tongue protrusion into cheek strength was normal. Motor: normal bulk and tone, full strength in the BUE, BLE, fine finger movements normal, no pronator drift. No focal weakness Sensory: normal and symmetric to light touch, pinprick, and Vibration, in the upper and lower extremities Coordination: finger-nose-finger, heel-to-shin bilaterally, no dysmetria Reflexes: Brachioradialis 2/2, biceps 2/2, triceps 2/2, patellar 2/2, Achilles 2/2, plantar responses were flexor bilaterally. Gait and Station: Rising up from seated position without assistance, normal stance, moderate stride, good arm swing, smooth turning, able to perform tiptoe, and heel walking without difficulty. Tandem gait is steady  DIAGNOSTIC DATA (LABS, IMAGING, TESTING) -  ASSESSMENT AND PLAN  35 y.o. year old male  has a past medical history of  Seizure disorder ([place]) (2015). and migraine here to follow-up. He had side effects to Keppra irritability  that was successfully transitioned to Vimpat. He remains on Topamax for his migraines.  Continue Topamax and Vimpat at current doses.  Continue Maxalt prn will refill Call for increase in headaches or any seizure activity F/U  yearly Dennie Bible, [facility], [facility], [facility] [address], [place], [place] [address] [phone]

## 2016-05-11 NOTE — Patient Instructions (Signed)
Continue Topamax and Vimpat at current doses.  Continue Maxalt prn Call for increase in headaches or any seizure activity F/U in 1 year

## 2016-05-12 NOTE — Progress Notes (Signed)
I have reviewed and agreed above plan. 

## 2016-09-24 ENCOUNTER — Other Ambulatory Visit: Payer: Self-pay | Admitting: Neurology

## 2016-09-24 IMAGING — CT CT ANGIO CHEST
2 of 9 series · 18 of 46 positions shown · IV contrast (Omni 300)
Comparison: Chest radiograph October 21, 2014.

CLINICAL DATA: Chest pain for 2 days

EXAM:
CT ANGIOGRAPHY CHEST WITH CONTRAST
TECHNIQUE: Multidetector CT imaging of the chest was performed using the
standard protocol during bolus administration of intravenous
contrast. Multiplanar CT image reconstructions and MIPs were
obtained to evaluate the vascular anatomy.
CONTRAST:  80mL OMNIPAQUE IOHEXOL 350 MG/ML SOLN

[Series 5: thins · axial · 0.70mm/px · z∈[-285,-19]mm · 15 of 300 slices shown]
[im 17/300  lung]
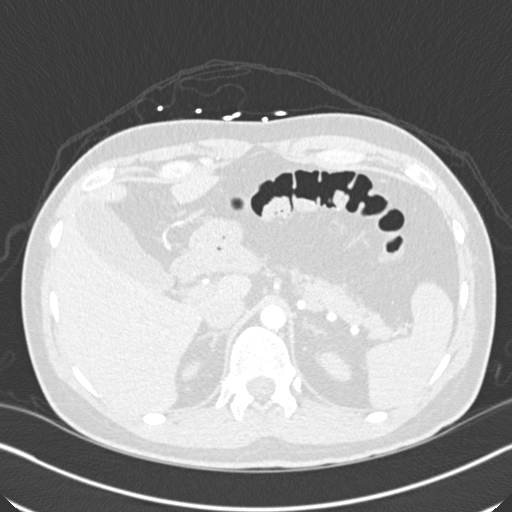
[im 34/300  soft-tissue]
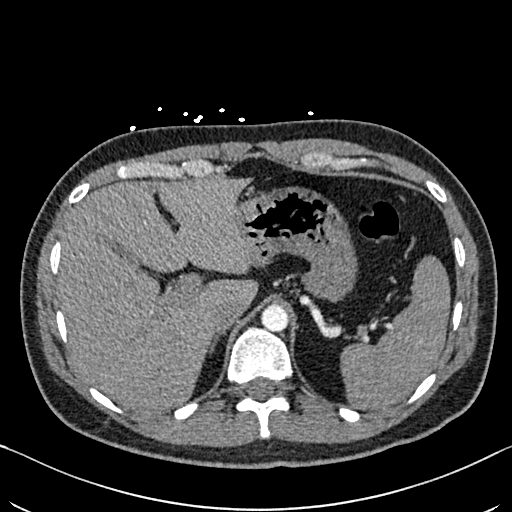
[im 50/300  lung]
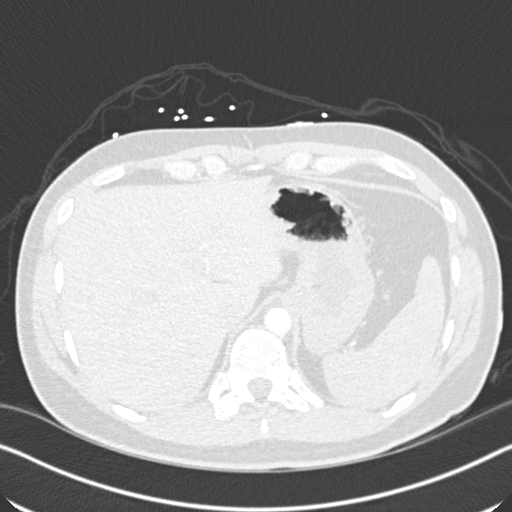
[im 67/300  soft-tissue]
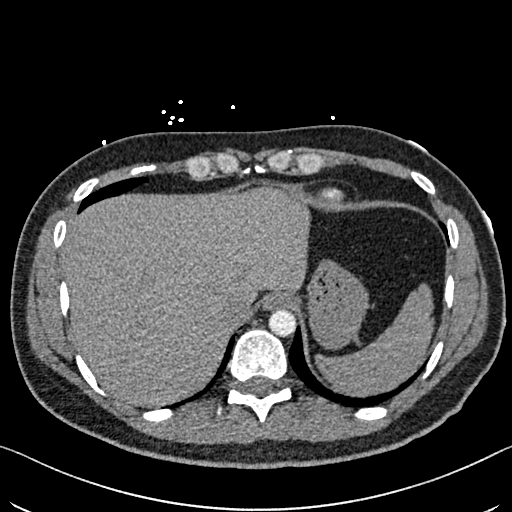
[im 100/300  lung]
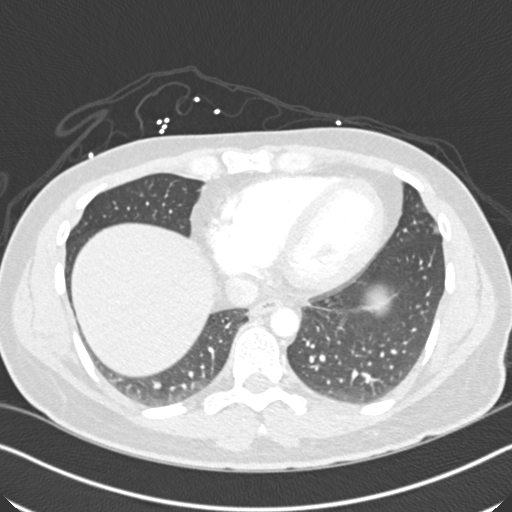
[im 117/300  soft-tissue]
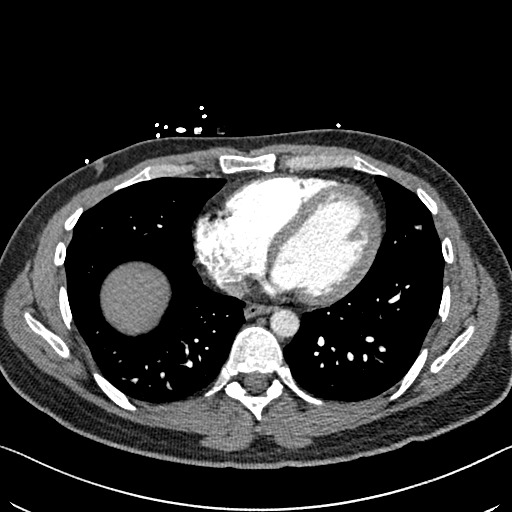
[im 133/300  lung]
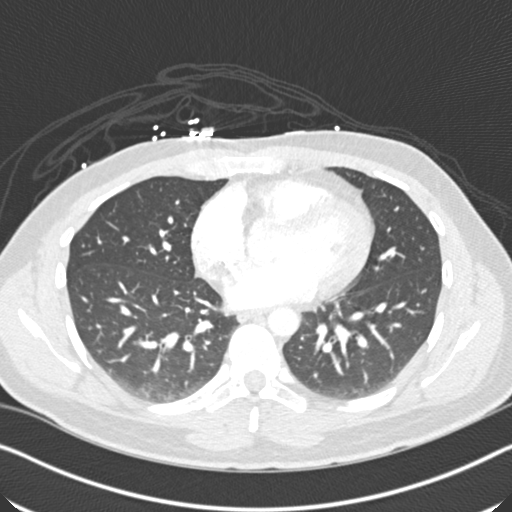
[im 150/300  soft-tissue]
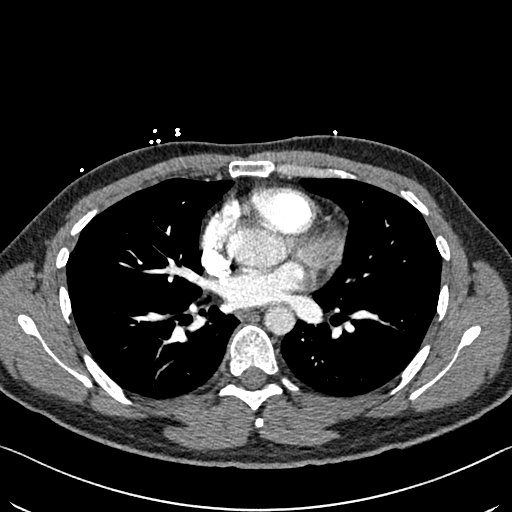
[im 167/300  lung]
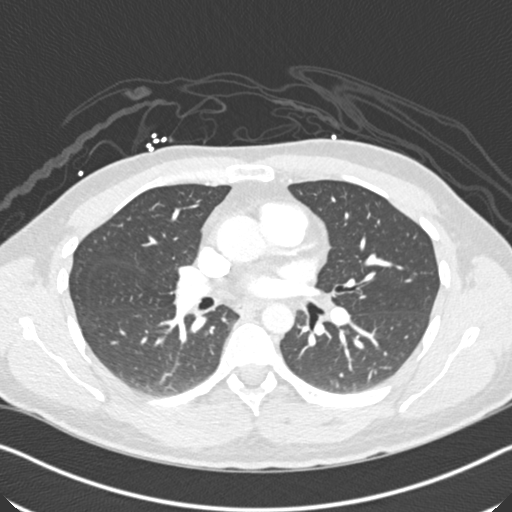
[im 183/300  soft-tissue]
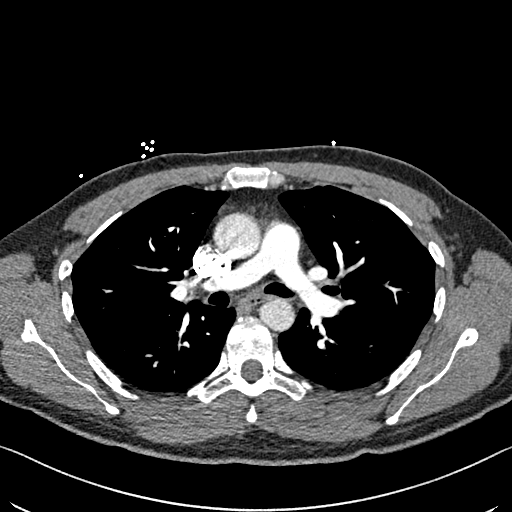
[im 200/300  lung]
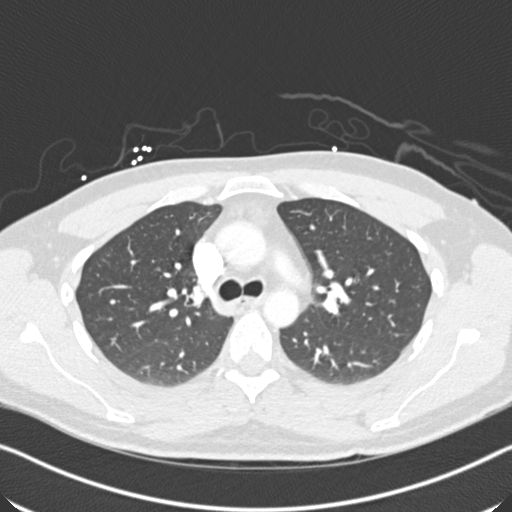
[im 233/300  soft-tissue]
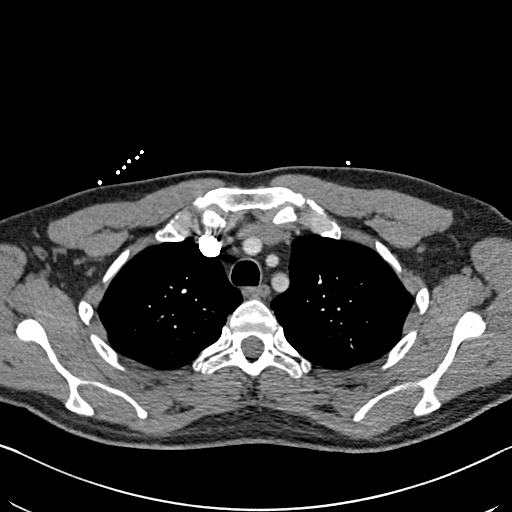
[im 250/300  lung]
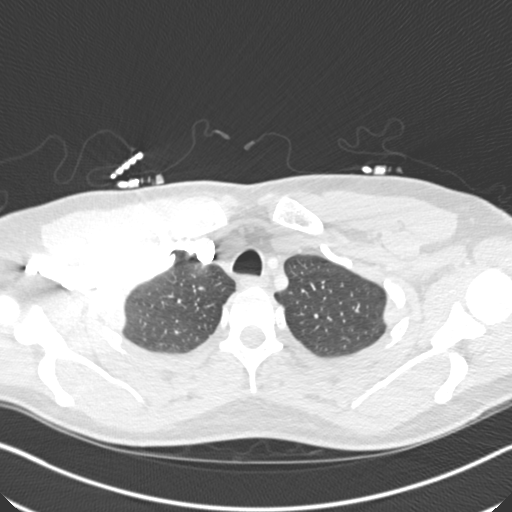
[im 266/300  soft-tissue]
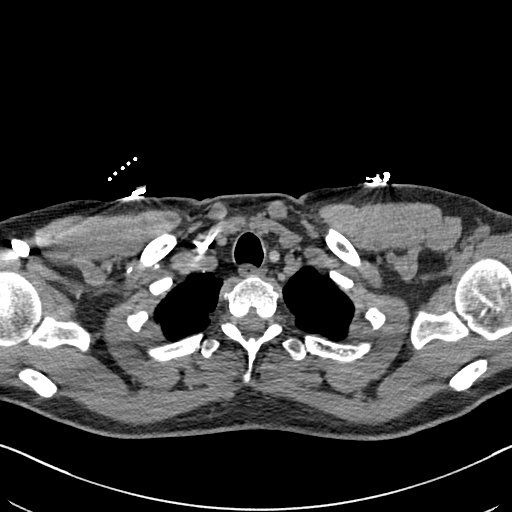
[im 283/300  lung]
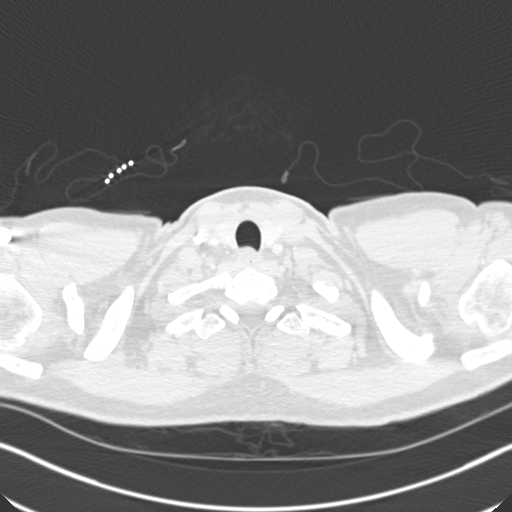

[Series 7: coronal mpr · coronal · 0.61mm/px · 3 of 118 slices shown]
[im 30/118  soft-tissue]
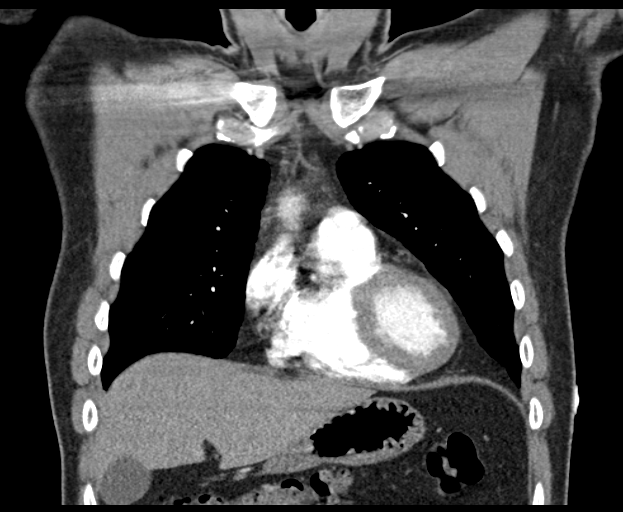
[im 59/118  soft-tissue]
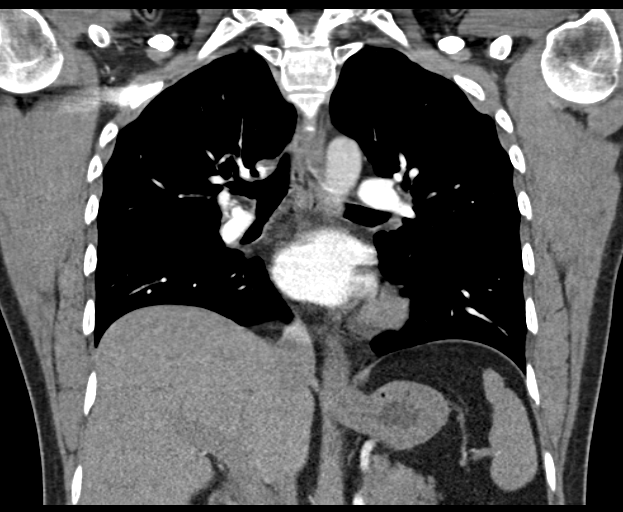
[im 88/118  soft-tissue]
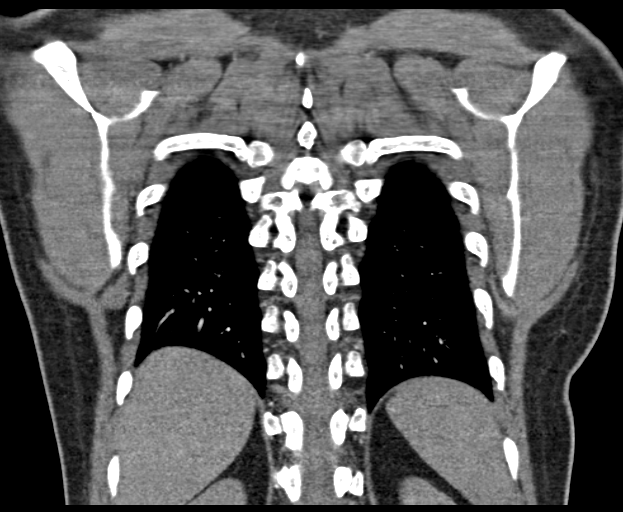

[18 of 46 positions shown; findings below may reference images not displayed]

FINDINGS: There is no demonstrable pulmonary embolus. There is no thoracic
aortic aneurysm or dissection.

On axial slice 58 series 6, there is a 6 mm nodular opacity in the
inferior lingula. There is mild atelectasis in the inferior lingula
as well. Lungs elsewhere clear. No edema or consolidation.

There is no appreciable thoracic adenopathy. The pericardium is not
thickened. Visualized thyroid is normal.

In the visualized upper abdomen, no lesions are appreciable.

There are no blastic or lytic bone lesions.

Review of the MIP images confirms the above findings.
IMPRESSION: No demonstrable pulmonary embolus.

6 mm nodular opacity inferior lingula. Followup of this nodular
opacity should be based on [HOSPITAL] guidelines. If the
patient is at high risk for bronchogenic carcinoma, follow-up chest
CT at 6-12 months is recommended. If the patient is at low risk for
bronchogenic carcinoma, follow-up chest CT at 12 months is
recommended. This recommendation follows the consensus statement:
Guidelines for Management of Small Pulmonary Nodules Detected on CT
Scans: A Statement from the [HOSPITAL] as published in

No edema or consolidation. No apparent adenopathy. There is mild
atelectasis in the inferior lingula.

## 2016-10-04 ENCOUNTER — Other Ambulatory Visit: Payer: Self-pay | Admitting: *Deleted

## 2016-10-04 MED ORDER — TOPIRAMATE 100 MG PO TABS: 100.0000 mg | ORAL_TABLET | Freq: Two times a day (BID) | ORAL | 3 refills | Status: DC

## 2016-10-05 ENCOUNTER — Other Ambulatory Visit: Payer: Self-pay | Admitting: *Deleted

## 2016-10-05 MED ORDER — OMEPRAZOLE 40 MG PO CPDR: DELAYED_RELEASE_CAPSULE | ORAL | 11 refills | Status: DC

## 2016-10-05 MED ORDER — VIMPAT 50 MG PO TABS: 50.0000 mg | ORAL_TABLET | Freq: Two times a day (BID) | ORAL | 5 refills | Status: DC

## 2016-10-05 MED ORDER — TADALAFIL 20 MG PO TABS: 20.0000 mg | ORAL_TABLET | ORAL | 11 refills | Status: DC | PRN

## 2016-10-05 NOTE — Telephone Encounter (Signed)
Fax confirmation received for vimpat, [facility] [phone]. sy

## 2016-10-05 NOTE — Telephone Encounter (Signed)
Last filled 11/2015 for one year. Last OV 08/27-acute

## 2016-10-13 ENCOUNTER — Other Ambulatory Visit: Payer: Self-pay | Admitting: Nurse Practitioner

## 2016-10-13 NOTE — Telephone Encounter (Signed)
Note routed to Rance Muir to see if he qualifies for patient assistance. Hinton Dyer will contact patient for necessary information to start the process.

## 2016-10-13 NOTE — Telephone Encounter (Signed)
There is not a generic at present.

## 2016-10-13 NOTE — Telephone Encounter (Signed)
Pt would like to know if the VIMPAT 50 MG TABS tablet  Is available in a generic form if so he needs it that way to save cost, he confirmed home# is best contact#

## 2016-10-19 NOTE — Telephone Encounter (Signed)
I have called Patient x2 asking him to call me back. Patient will not be able to receive patient assistance if his [facility] BS is still active.

## 2016-10-20 NOTE — Telephone Encounter (Signed)
LVM advising patient there is no comparable seizure medication to Vimpat. It is in a class of it's own. Advised if he does not qualify for pt assistance, he can go on their website and print a coupon. Left number for any questions.

## 2016-10-20 NOTE — Telephone Encounter (Signed)
Pt would like to know if for insurance purposes he can get something comparable to the VIMPAT 50 MG TABS tablet

## 2016-10-20 NOTE — Telephone Encounter (Signed)
There is nothing comparable it is in its on class. Does he qualify for pt assistance

## 2016-11-11 NOTE — Telephone Encounter (Addendum)
Received call from patient who apologized for not calling back sooner. He stated that he is just beginning a new job, will not have insurance for a month. He has only two tabs of Vimpat left. He stated he cannot afford it. This RN advised he can check website for a coupon. However when this RN looked at [employer], did not see a coupon. Advised patient this RN will send note to Dr Krista Blue.  He verbalized understanding, appreciation.   Spoke with Rance Muir who stated Dr Krista Blue may need to call in a some tablets for patient until he can come see Hinton Dyer to apply for patient assistance. Hinton Dyer stated it is a week long process, and he may not qualify.  Will route to Dr Krista Blue and let Sharyn Lull RN know of importance of message.

## 2016-11-11 NOTE — Telephone Encounter (Signed)
Chart reviewed, patient was seen for seizure, last reported seizure was in October 2015,  He is doing well with Topamax 100 mg twice a day, Vimpat 50 mg twice a day,  Previously tried higher dose of Keppra 1500 mg twice a day, Depakote ER 500 mg 2 tablets every night, continue complains seizure-like activity.  I am not sure that low dose of Vimpat 50 mg twice a day is account for all his improvement  Other option for his seizure activities are higher dose of topiramate 100 mg in the morning/200 mg every night, Versus try Keppra, Depakote again,

## 2016-11-11 NOTE — Telephone Encounter (Addendum)
Spoke with patient and gave him Dr Rhea Belton reply with medication options. Patient stated on Keppra he had mood changes and on Depakote he had hand tremors. He stated he would like to increase Topiramate per Dr Rhea Belton recommendation. This RN stated would route his Rx request to Dr Krista Blue to place new Rx. Advised he call for any medication issues or seizure activity. Patient verbalized understanding, appreciation.

## 2016-11-12 ENCOUNTER — Other Ambulatory Visit: Payer: Self-pay | Admitting: *Deleted

## 2016-11-12 MED ORDER — TOPIRAMATE 100 MG PO TABS: ORAL_TABLET | ORAL | 5 refills | Status: DC

## 2016-12-01 ENCOUNTER — Other Ambulatory Visit: Payer: Self-pay | Admitting: Family Medicine

## 2017-02-23 ENCOUNTER — Other Ambulatory Visit: Payer: Self-pay | Admitting: Nurse Practitioner

## 2017-03-22 ENCOUNTER — Other Ambulatory Visit: Payer: Self-pay | Admitting: Family Medicine

## 2017-04-28 ENCOUNTER — Ambulatory Visit (INDEPENDENT_AMBULATORY_CARE_PROVIDER_SITE_OTHER): Payer: BLUE CROSS/BLUE SHIELD | Admitting: Family Medicine

## 2017-04-28 ENCOUNTER — Encounter: Payer: Self-pay | Admitting: Family Medicine

## 2017-04-28 ENCOUNTER — Other Ambulatory Visit: Payer: Self-pay | Admitting: Family Medicine

## 2017-04-28 VITALS — BP 120/80 | HR 67 | Temp 98.3°F | Ht 71.0 in | Wt 175.0 lb

## 2017-04-28 DIAGNOSIS — G4726 Circadian rhythm sleep disorder, shift work type: Secondary | ICD-10-CM | POA: Diagnosis not present

## 2017-04-28 DIAGNOSIS — Z0000 Encounter for general adult medical examination without abnormal findings: Secondary | ICD-10-CM

## 2017-04-28 DIAGNOSIS — G40909 Epilepsy, unspecified, not intractable, without status epilepticus: Secondary | ICD-10-CM

## 2017-04-28 DIAGNOSIS — K219 Gastro-esophageal reflux disease without esophagitis: Secondary | ICD-10-CM | POA: Diagnosis not present

## 2017-04-28 DIAGNOSIS — R7989 Other specified abnormal findings of blood chemistry: Secondary | ICD-10-CM

## 2017-04-28 DIAGNOSIS — E538 Deficiency of other specified B group vitamins: Secondary | ICD-10-CM

## 2017-04-28 DIAGNOSIS — G43009 Migraine without aura, not intractable, without status migrainosus: Secondary | ICD-10-CM

## 2017-04-28 LAB — COMPREHENSIVE METABOLIC PANEL
ALK PHOS: 54 U/L (ref 39–117)
ALT: 12 U/L (ref 0–53)
AST: 13 U/L (ref 0–37)
Albumin: 4.6 g/dL (ref 3.5–5.2)
BILIRUBIN TOTAL: 1 mg/dL (ref 0.2–1.2)
BUN: 27 mg/dL — ABNORMAL HIGH (ref 6–23)
CALCIUM: 9.4 mg/dL (ref 8.4–10.5)
CHLORIDE: 111 meq/L (ref 96–112)
CO2: 24 mEq/L (ref 19–32)
CREATININE: 1.21 mg/dL (ref 0.40–1.50)
GFR: 72.83 mL/min (ref >60.00)
GLUCOSE: 81 mg/dL (ref 70–99)
Potassium: 3.6 mEq/L (ref 3.5–5.1)
Sodium: 143 mEq/L (ref 135–145)
Total Protein: 6.8 g/dL (ref 6.0–8.3)

## 2017-04-28 LAB — CBC WITH DIFFERENTIAL/PLATELET
BASOS ABS: 0.1 10*3/uL (ref 0.0–0.1)
Basophils Relative: 0.9 % (ref 0.0–3.0)
EOS ABS: 0.2 10*3/uL (ref 0.0–0.7)
EOS PCT: 2.9 % (ref 0.0–5.0)
HCT: 44.4 % (ref 39.0–52.0)
Hemoglobin: 15 g/dL (ref 13.0–17.0)
Lymphocytes Relative: 32.8 % (ref 12.0–46.0)
Lymphs Abs: 2 10*3/uL (ref 0.7–4.0)
MCHC: 33.7 g/dL (ref 30.0–36.0)
MCV: 87.2 fl (ref 78.0–100.0)
MONO ABS: 0.6 10*3/uL (ref 0.1–1.0)
Monocytes Relative: 9.5 % (ref 3.0–12.0)
Neutro Abs: 3.3 10*3/uL (ref 1.4–7.7)
Neutrophils Relative %: 53.9 % (ref 43.0–77.0)
PLATELETS: 289 10*3/uL (ref 150.0–400.0)
RBC: 5.09 Mil/uL (ref 4.22–5.81)
RDW: 13.4 % (ref 11.5–15.5)
WBC: 6 10*3/uL (ref 4.0–10.5)

## 2017-04-28 LAB — VITAMIN D 25 HYDROXY (VIT D DEFICIENCY, FRACTURES): VITD: 40.85 ng/mL (ref 30.00–100.00)

## 2017-04-28 LAB — VITAMIN B12: VITAMIN B 12: 1048 pg/mL — AB (ref 211–911)

## 2017-04-28 MED ORDER — OMEPRAZOLE 40 MG PO CPDR: DELAYED_RELEASE_CAPSULE | ORAL | 11 refills | Status: DC

## 2017-04-28 MED ORDER — CYANOCOBALAMIN 500 MCG PO TABS: 500.0000 ug | ORAL_TABLET | ORAL | Status: AC

## 2017-04-28 MED ORDER — TADALAFIL 20 MG PO TABS: 20.0000 mg | ORAL_TABLET | ORAL | 3 refills | Status: DC | PRN

## 2017-04-28 NOTE — Assessment & Plan Note (Signed)
Chronic, stable on topamax. Followed by neurology.

## 2017-04-28 NOTE — Assessment & Plan Note (Signed)
Preventative protocols reviewed and updated unless pt declined. Discussed healthy diet and lifestyle.  

## 2017-04-28 NOTE — Progress Notes (Signed)
BP 120/80 (BP Location: Left Arm, Patient Position: Sitting, Cuff Size: Normal)   Pulse 67   Temp 98.3 F (36.8 C) (Oral)   Ht 5\' 11"  (1.803 m)   Wt 175 lb (79.4 kg)   SpO2 99%   BMI 24.41 kg/m    CC: CPE Subjective:    Patient ID: Elijah Huang, male    DOB: 11/22/1980, 36 y.o.   MRN: [id number]  HPI: Elijah Huang is a 36 y.o. male presenting on 04/28/2017 for Annual Exam (same day labs)   Followed by neurology for seizures and migraines. Now on topamax to control both. Seizure free, infrequent migraines treated with maxalt.   Preventative: Flu shot yearly Tdap 2012 Seat belt use discussed Sunscreen use discussed. No changing moles on skin.  Non smoker Alcohol - occasional on weekends.   Caffeine: 1-2 soft drinks/day  Divorced.  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 Interim medical history since our last visit reviewed. Allergies and medications reviewed and updated. Outpatient Medications Prior to Visit  Medication Sig Dispense Refill  . cetirizine (ZYRTEC) 10 MG tablet Take 10 mg by mouth daily as needed.     . Cholecalciferol (VITAMIN D3) 1000 units CAPS Take 1 capsule (1,000 Units total) by mouth daily. 30 capsule   . cyanocobalamin (V-R VITAMIN B-12) 500 MCG tablet Take 1 tablet (500 mcg total) by mouth daily.    . rizatriptan (MAXALT-MLT) 5 MG disintegrating tablet DISSOLVE 1 TABLET IN MOUTH AS NEEDED. MAY REPEAT IN 2 HOURS IF NEEDED 15 tablet 6  . topiramate (TOPAMAX) 100 MG tablet Take one tab in am and two tabs in pm. 90 tablet 5  . omeprazole (PRILOSEC) 40 MG capsule TAKE ONE CAPSULE BY MOUTH EVERY MONDAY [facility]  AND FRIDAY 30 capsule 11  . omeprazole (PRILOSEC) 40 MG capsule 1 CAPSULE EVERY Monday, Wednesday AND Friday.  NEEDS OFFICE VISIT 30 capsule 0  . tadalafil (CIALIS) 20 MG tablet Take 1 tablet (20 mg total) by mouth every other day as needed for erectile dysfunction. 6 tablet 11  . VIMPAT 50 MG TABS tablet Take 1 tablet (50 mg total) by mouth 2 (two) times daily. 60 tablet 5   No facility-administered medications prior to visit.      Per HPI unless specifically indicated in ROS section below Review of Systems  Constitutional: Negative for activity change, appetite change, chills, fatigue, fever and unexpected weight change.  HENT: Negative for hearing loss.   Eyes: Negative for visual disturbance.  Respiratory: Negative for cough, chest tightness, shortness of breath and wheezing.   Cardiovascular: Negative for chest pain, palpitations and leg swelling.  Gastrointestinal: Negative for abdominal distention, abdominal pain, blood in stool, constipation, diarrhea, nausea and vomiting.  Genitourinary: Negative for difficulty urinating and hematuria.  Musculoskeletal: Negative for arthralgias, myalgias and neck pain.  Skin: Negative for rash.  Neurological: Positive for headaches (migraines). Negative for dizziness, seizures and syncope.  Hematological: Negative for adenopathy. Does not bruise/bleed easily.  Psychiatric/Behavioral: Negative for dysphoric mood. The patient is not nervous/anxious.        Objective:    BP 120/80 (BP Location: Left Arm, Patient Position: Sitting, Cuff Size: Normal)   Pulse 67   Temp 98.3 F (36.8 C) (Oral)   Ht 5\' 11"  (1.803  m)   Wt 175 lb (79.4 kg)   SpO2 99%   BMI 24.41 kg/m   Wt Readings from Last 3 Encounters:  04/28/17 175 lb (79.4 kg)  05/11/16 173 lb 12.8 oz (78.8 kg)  02/20/16 173 lb (78.5 kg)    Physical Exam  Constitutional: He is oriented to person, place, and time. He appears well-developed and well-nourished. No distress.  HENT:  Head: Normocephalic and atraumatic.  Right Ear: Hearing, tympanic membrane, external ear and ear canal normal.  Left  Ear: Hearing, tympanic membrane, external ear and ear canal normal.  Nose: Nose normal.  Mouth/Throat: Uvula is midline, oropharynx is clear and moist and mucous membranes are normal. No oropharyngeal exudate, posterior oropharyngeal edema or posterior oropharyngeal erythema.  Eyes: Pupils are equal, round, and reactive to light. Conjunctivae and EOM are normal. No scleral icterus.  Neck: Normal range of motion. Neck supple.  Cardiovascular: Normal rate, regular rhythm, normal heart sounds and intact distal pulses.   No murmur heard. Pulses:      Radial pulses are 2+ on the right side, and 2+ on the left side.  Pulmonary/Chest: Effort normal and breath sounds normal. No respiratory distress. He has no wheezes. He has no rales.  Abdominal: Soft. Bowel sounds are normal. He exhibits no distension and no mass. There is no tenderness. There is no rebound and no guarding.  Musculoskeletal: Normal range of motion. He exhibits no edema.  Lymphadenopathy:    He has no cervical adenopathy.  Neurological: He is alert and oriented to person, place, and time.  CN grossly intact, station and gait intact  Skin: Skin is warm and dry. No rash noted.  Psychiatric: He has a normal mood and affect. His behavior is normal. Judgment and thought content normal.  Nursing note and vitals reviewed.      Assessment & Plan:   Problem List Items Addressed This Visit    GERD (gastroesophageal reflux disease)    Chronic, controlled on PPI MWF      Relevant Medications   omeprazole (PRILOSEC) 40 MG capsule   Healthcare maintenance - Primary    Preventative protocols reviewed and updated unless pt declined. Discussed healthy diet and lifestyle.       Migraine without aura and without status migrainosus, not intractable    Chronic, stable on topamax. Followed by neurology. Continue.       Relevant Medications   tadalafil (CIALIS) 20 MG tablet   Seizure disorder (HCC)    Chronic, stable on topamax. Followed  by neurology.       Relevant Orders   Comprehensive metabolic panel   CBC with Differential/Platelet   Shift work sleep disorder    Occasional trouble. Reviewed sleep hygiene measures, handout provided        Other Visit Diagnoses    Low serum vitamin B12       Relevant Orders   Vitamin B12   Low vitamin D level       Relevant Orders   VITAMIN D 25 Hydroxy (Vit-D Deficiency, Fractures)       Follow up plan: Return in about 1 year (around 04/28/2018) for annual exam, prior fasting for blood work.  Ria Bush, MD

## 2017-04-28 NOTE — Assessment & Plan Note (Signed)
Chronic, stable on topamax. Followed by neurology. Continue.

## 2017-04-28 NOTE — Patient Instructions (Addendum)
You are doing well today. Labs today.  Continue current medicines. Return as needed or in 1 year for next physical.  Health Maintenance, Male A healthy lifestyle and preventive care is important for your health and wellness. Ask your health care provider about what schedule of regular examinations is right for you. What should I know about weight and diet? Eat a Healthy Diet  Eat plenty of vegetables, fruits, whole grains, low-fat dairy products, and lean protein.  Do not eat a lot of foods high in solid fats, added sugars, or salt.  Maintain a Healthy Weight Regular exercise can help you achieve or maintain a healthy weight. You should:  Do at least 150 minutes of exercise each week. The exercise should increase your heart rate and make you sweat (moderate-intensity exercise).  Do strength-training exercises at least twice a week.  Watch Your Levels of Cholesterol and Blood Lipids  Have your blood tested for lipids and cholesterol every 5 years starting at 34 years of age. If you are at high risk for heart disease, you should start having your blood tested when you are 34 years old. You may need to have your cholesterol levels checked more often if: ? Your lipid or cholesterol levels are high. ? You are older than 34 years of age. ? You are at high risk for heart disease.  What should I know about cancer screening? Many types of cancers can be detected early and may often be prevented. Lung Cancer  You should be screened every year for lung cancer if: ? You are a current smoker who has smoked for at least 30 years. ? You are a former smoker who has quit within the past 15 years.  Talk to your health care provider about your screening options, when you should start screening, and how often you should be screened.  Colorectal Cancer  Routine colorectal cancer screening usually begins at 34 years of age and should be repeated every 5-10 years until you are 34 years old. You may  need to be screened more often if early forms of precancerous polyps or small growths are found. Your health care provider may recommend screening at an earlier age if you have risk factors for colon cancer.  Your health care provider may recommend using home test kits to check for hidden blood in the stool.  A small camera at the end of a tube can be used to examine your colon (sigmoidoscopy or colonoscopy). This checks for the earliest forms of colorectal cancer.  Prostate and Testicular Cancer  Depending on your age and overall health, your health care provider may do certain tests to screen for prostate and testicular cancer.  Talk to your health care provider about any symptoms or concerns you have about testicular or prostate cancer.  Skin Cancer  Check your skin from head to toe regularly.  Tell your health care provider about any new moles or changes in moles, especially if: ? There is a change in a mole's size, shape, or color. ? You have a mole that is larger than a pencil eraser.  Always use sunscreen. Apply sunscreen liberally and repeat throughout the day.  Protect yourself by wearing long sleeves, pants, a wide-brimmed hat, and sunglasses when outside.  What should I know about heart disease, diabetes, and high blood pressure?  If you are 88-32 years of age, have your blood pressure checked every 3-5 years. If you are 7 years of age or older, have your blood pressure  checked every year. You should have your blood pressure measured twice-once when you are at a hospital or clinic, and once when you are not at a hospital or clinic. Record the average of the two measurements. To check your blood pressure when you are not at a hospital or clinic, you can use: ? An automated blood pressure machine at a pharmacy. ? A home blood pressure monitor.  Talk to your health care provider about your target blood pressure.  If you are between 110-62 years old, ask your health care  provider if you should take aspirin to prevent heart disease.  Have regular diabetes screenings by checking your fasting blood sugar level. ? If you are at a normal weight and have a low risk for diabetes, have this test once every three years after the age of 68. ? If you are overweight and have a high risk for diabetes, consider being tested at a younger age or more often.  A one-time screening for abdominal aortic aneurysm (AAA) by ultrasound is recommended for men aged 71-75 years who are current or former smokers. What should I know about preventing infection? Hepatitis B If you have a higher risk for hepatitis B, you should be screened for this virus. Talk with your health care provider to find out if you are at risk for hepatitis B infection. Hepatitis C Blood testing is recommended for:  Everyone born from [birth year] through 1963.  Anyone with known risk factors for hepatitis C.  Sexually Transmitted Diseases (STDs)  You should be screened each year for STDs including gonorrhea and chlamydia if: ? You are sexually active and are younger than 34 years of age. ? You are older than 34 years of age and your health care provider tells you that you are at risk for this type of infection. ? Your sexual activity has changed since you were last screened and you are at an increased risk for chlamydia or gonorrhea. Ask your health care provider if you are at risk.  Talk with your health care provider about whether you are at high risk of being infected with HIV. Your health care provider may recommend a prescription medicine to help prevent HIV infection.  What else can I do?  Schedule regular health, dental, and eye exams.  Stay current with your vaccines (immunizations).  Do not use any tobacco products, such as cigarettes, chewing tobacco, and e-cigarettes. If you need help quitting, ask your health care provider.  Limit alcohol intake to no more than 2 drinks per day. One drink equals 12  ounces of beer, 5 ounces of wine, or 1 ounces of hard liquor.  Do not use street drugs.  Do not share needles.  Ask your health care provider for help if you need support or information about quitting drugs.  Tell your health care provider if you often feel depressed.  Tell your health care provider if you have ever been abused or do not feel safe at home. This information is not intended to replace advice given to you by your health care provider. Make sure you discuss any questions you have with your health care provider. Document Released: 12/25/2007 Document Revised: 02/25/2016 Document Reviewed: 04/01/2015 Elsevier Interactive Patient Education  [employer].

## 2017-04-28 NOTE — Assessment & Plan Note (Signed)
Chronic, controlled on PPI MWF

## 2017-04-28 NOTE — Assessment & Plan Note (Signed)
Occasional trouble. Reviewed sleep hygiene measures, handout provided

## 2017-05-04 ENCOUNTER — Telehealth: Payer: Self-pay

## 2017-05-04 NOTE — Telephone Encounter (Signed)
Faxed Cialis PA to [facility].

## 2017-05-06 NOTE — Telephone Encounter (Signed)
Received PA approval for Cialis 20 mg effective 04/04/17- 05/04/18.

## 2017-05-12 ENCOUNTER — Ambulatory Visit: Payer: BLUE CROSS/BLUE SHIELD | Admitting: Nurse Practitioner

## 2017-05-29 NOTE — Progress Notes (Signed)
[facility]  PATIENT: Elijah Huang DOB: [birth year]   REASON FOR VISIT: Follow-up for seizure disorder and migraine Elijah FROM: Patient    Elijah OF PRESENT ILLNESS: Elijah Huang is a 36 years old right-handed Caucasian male, accompanied by his girlfriend, referred by his primary care physician Dr. Danise Huang for evaluation of possible seizure, with his girl friend Elijah Huang had past medical Elijah of GERD, erectile dysfunction He presented with seizure-like event, the initial episode was witness by his girlfriend, Dec 07 2013, after waking up talking with his girlfriend for a while, shortly afterwards, he was noticed to lie on his left side, body shaking, loss of consciousness, lasting for a few minutes, with tongue biting, lip numbness, next day, in May 30th 2015, while taking a shower, his girlfriend heard a loud thundering, he was sitting in the shower, confused, did not know what has happened, no self injury, He denied previous Elijah of seizure, he presented to the emergency room Dec 09 2013, with constellation of complaints, intermittent chest pain, blurry vision, left eye pain, lightheadedness, He had a CAT scan of the brain that was normal, laboratory showed normal CBC, CMP, negative troponin, 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 Sept 24th 2015: At work, he became  unresponsive to his manager, staring, hand tremor, mild urinary incontinence He has no collection of hte events, no warning signs,  He noticed side effects on Depakote this includes weight gain, increased bilateral hands tremor , Depakote was stop in Nov 2015, started Keppra  UPDATE October 30 2014:He is doing well with keppra 1500mg  bid , Topamax 100mg  bid, no recurrent seizure, rarely has headache, Maxalt was helpful.  UPDATE 04/30/2015 CMMr. Elijah Huang, 36 year old male returns for follow-up he has been seen in the past by Dr. Krista Huang in our clinic . He has a Elijah of generalized seizure disorder and migraine headaches. He is currently on Keppra 1500 mg twice daily and Topamax 100mg  twice daily. Last seizure was 05/22/2014. He continues to have an occasional headache which is usually relieved with Maxalt. He needs refills on Maxalt. He is not aware of any food triggers. He returns for reevaluation  UPDATE 07/24/2015. CMMr. Elijah Huang, 36 year old male returns for follow-up. He has a Elijah of seizure disorder and migraines. His migraines are in excellent control on Topamax 100 twice daily and Maxalt acutely. His last seizure event occurred in November 2015. He was last seen in the office 10/6/ 2016. At that time he was doing well on Keppra however he called in to the office several weeks ago with increased irritability and tiredness on the drug. He returns today to talk about changing his seizure medications. He has previously been on Depakote with continued seizures. His previous seizure episodes are described as his body gets stiff with jerking of the extremities and loss of consciousness. These have been witnessed by his girlfriend in the past.  He returns for reevaluation UPDATE 04/19/2017CM Mr. Elijah Huang, 36 year old male returns for follow-up he was last seen in the office 07/24/2015. He has Elijah of migraines that are in excellent control with Topamax also has a Elijah of seizure disorder some increased irritability  and tiredness on Keppra reported at last visit. He was successfully switched to Vimpat and has tapered off of Keppra without difficulty. He claims he feels much better. He returns for reevaluation no further seizure activity. UPDATE 10/31/2017CM Mr. Elijah Huang, 36 year old male returns for follow-up. He has a Elijah of seizure disorder with last seizure event occurring in November 2015. He is currently on Vimpat. He has Elijah of migraines and is on Topamax 100 mg twice daily Maxalt acutely. His headaches are in excellent control and he rarely uses Maxalt. He just got tired by the [facility]. He returns for reevaluation  UPDATE 11/19/2018CM Mr. Elijah Huang, 36 year old male returns for follow-up with a Elijah of seizure disorder.  Last seizure occurred in 2015.  In addition he has Elijah of migraines.  He was on Vimpat and Topamax when last seen however his insurance changed and he could not afford the Vimpat so his Topamax was increased.  He is currently taking 300 mg total dose daily.  He is now working third shift, and complains of inability to go to sleep.  He is getting about 3-4 hours of sleep daily and his headaches and have increased since he has been on shift work.  His primary care has given him some information on good sleep habits and hygiene.  He was made aware that lack of sleep is not only a migraine trigger but a seizure trigger as well.  Maxalt works acutely.  He returns for reevaluation  REVIEW OF SYSTEMS: Full 14 system review of systems performed and notable only for those listed, all others are neg:  Constitutional: neg  Cardiovascular: neg Ear/Nose/Throat: neg  Skin: neg Eyes: neg Respiratory: neg Gastroitestinal: neg  Hematology/Lymphatic: neg  Endocrine: neg Musculoskeletal:neg Allergy/Immunology: neg Neurological: Elijah of seizure disorder and migraine Psychiatric: neg Sleep : Shift work disorder, frequent waking   ALLERGIES: Allergies  Allergen Reactions  . Keppra  [Levetiracetam] Other (See Comments)    irritability    HOME MEDICATIONS: Outpatient Medications Prior to Visit  Medication Sig Dispense Refill  . cetirizine (ZYRTEC) 10 MG tablet Take 10 mg by mouth daily as needed.     . Cholecalciferol (VITAMIN D3) 1000 units CAPS Take 1 capsule (1,000 Units total) by mouth daily. 30 capsule   . cyanocobalamin (V-R VITAMIN B-12) 500 MCG tablet Take 1 tablet (500 mcg total) by mouth every Monday, Wednesday, and Friday.    Marland Kitchen omeprazole (PRILOSEC) 40 MG capsule TAKE ONE CAPSULE BY MOUTH EVERY MONDAY WEDNESDAY  AND FRIDAY 30 capsule 11  . rizatriptan (MAXALT-MLT) 5 MG disintegrating tablet DISSOLVE 1 TABLET IN MOUTH AS NEEDED. MAY REPEAT IN 2 HOURS IF NEEDED 15 tablet 6  . tadalafil (CIALIS) 20 MG tablet Take 1 tablet (20 mg total) by mouth every other day as needed for erectile dysfunction. 18 tablet 3  . topiramate (TOPAMAX) 100 MG tablet Take one tab in am and two tabs in pm. 90 tablet 5   No facility-administered medications prior to visit.     PAST MEDICAL Elijah: Past Medical Elijah:  Diagnosis Date  . 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 GERD (gastroesophageal reflux disease)   . Elijah of chicken pox   .  Migraines   . Seizure disorder ([place]) 2015   presumed partial seizures, started on depakote, rec no driving for 6 mo (11/4625, Dr Addison Lank)  . Seizures ([place])     PAST SURGICAL Elijah: Past Surgical Elijah:  Procedure Laterality Date  . TONSILLECTOMY  1989    FAMILY Elijah: Family Elijah  Problem Relation Age of Onset  . Diabetes Father   . Coronary artery disease Father 60       cabg 30, nonsmoker, obese  . Cancer Father 77       prostate  . Coronary artery disease Paternal Grandfather 41       deceased from same  . Cancer Maternal Grandfather        pancreatic  . Stroke Neg Hx     SOCIAL Elijah: Social Elijah   Socioeconomic Elijah  . Marital status:  Married    Spouse name: Not on file  . Number of children: 0  . Years of education: 59  . Highest education level: Not on file  Social Needs  . Financial resource strain: Not on file  . Food insecurity - worry: Not on file  . Food insecurity - inability: Not on file  . Transportation needs - medical: Not on file  . Transportation needs - non-medical: Not on file  Occupational Elijah  . Occupation: [employer]--works in [place] Use  . Smoking status: Never Smoker  . Smokeless tobacco: Former Systems developer    Types: Snuff  . Tobacco comment: occasional snuff use per chart  Substance and Sexual Activity  . Alcohol use: Yes    Alcohol/week: 0.0 oz    Comment: occasional  . Drug use: No  . Sexual activity: Not on file  Other Topics Concern  . Not on file  Social Elijah Narrative   Caffeine: 1-2 soft drinks/day   Divorced    Lives with his girlfriend  1 dog   Occupation: works at [employer]: no regular activity   Diet: good amt water, lots of fried foods, few fruits/vegetables.     PHYSICAL EXAM  Vitals:   05/30/17 1104  BP: 119/77  Pulse: 87  Weight: 176 lb (79.8 kg)   Body mass index is 24.55 kg/m. Generalized: Well developed, in no acute distress  Head: normocephalic and atraumatic,. Oropharynx benign  Neck: Supple,  Musculoskeletal: No deformity   Neurological examination   Mentation: Alert oriented to time, place, Elijah taking. Attention span and concentration appropriate. Recent and remote memory intact. Follows all commands speech and language fluent.   Cranial nerve II-XII: Pupils were equal round reactive to light extraocular movements were full, visual field were full on confrontational test. Facial sensation and strength were normal. hearing was intact to finger rubbing bilaterally. Uvula tongue midline. head turning and shoulder shrug were normal and symmetric.Tongue protrusion into cheek strength was normal. Motor: normal bulk  and tone, full strength in the BUE, BLE, Sensory: normal and symmetric to light touch,  Coordination: finger-nose-finger, heel-to-shin bilaterally, no dysmetria Reflexes: Brachioradialis 2/2, biceps 2/2, triceps 2/2, patellar 2/2, Achilles 2/2, plantar responses were flexor bilaterally. Gait and Station: Rising up from seated position without assistance, normal stance, moderate stride, good arm swing, smooth turning, able to perform tiptoe, and heel walking without difficulty. Tandem gait is steady  DIAGNOSTIC DATA (LABS, IMAGING, TESTING) -  ASSESSMENT AND PLAN  36 y.o. year old male  has a past medical Elijah of  Seizure disorder ([place]) (2015). and migraine here to follow-up. He had  side effects to Keppra irritability that was successfully transitioned to Vimpat.  He has stopped Vimpat since last seen due to expense .He remains on Topamax for his migraines.  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  Continue Topamax at current doses. Will refill Continue Maxalt prn will refill Order sleep study for shift work disorder/insomnia Lack of sleep is not only and migraine triggered by a seizure trigger, discussed sleep hygiene Call for increase in headaches or any seizure activity F/U  yearly Dennie Bible, [facility], [facility], [facility] [address] [place], [place] [address] [phone]

## 2017-05-30 ENCOUNTER — Ambulatory Visit: Payer: BLUE CROSS/BLUE SHIELD | Admitting: Nurse Practitioner

## 2017-05-30 ENCOUNTER — Encounter: Payer: Self-pay | Admitting: Nurse Practitioner

## 2017-05-30 VITALS — BP 119/77 | HR 87 | Wt 176.0 lb

## 2017-05-30 DIAGNOSIS — G4726 Circadian rhythm sleep disorder, shift work type: Secondary | ICD-10-CM

## 2017-05-30 DIAGNOSIS — G43009 Migraine without aura, not intractable, without status migrainosus: Secondary | ICD-10-CM

## 2017-05-30 DIAGNOSIS — G40909 Epilepsy, unspecified, not intractable, without status epilepticus: Secondary | ICD-10-CM

## 2017-05-30 MED ORDER — RIZATRIPTAN BENZOATE 5 MG PO TBDP: ORAL_TABLET | ORAL | 11 refills | Status: DC

## 2017-05-30 MED ORDER — TOPIRAMATE 100 MG PO TABS: ORAL_TABLET | ORAL | 11 refills | Status: DC

## 2017-05-30 NOTE — Patient Instructions (Signed)
Continue Topamax at current doses. Will refill Continue Maxalt prn will refill Order sleep study for shift work disorder/insomnia Call for increase in headaches or any seizure activity F/U  yearly

## 2017-05-31 NOTE — Progress Notes (Signed)
I have reviewed and agreed above plan. 

## 2017-06-30 ENCOUNTER — Institutional Professional Consult (permissible substitution): Payer: BLUE CROSS/BLUE SHIELD | Admitting: Neurology

## 2018-05-25 ENCOUNTER — Other Ambulatory Visit: Payer: Self-pay | Admitting: Family Medicine

## 2018-05-31 ENCOUNTER — Other Ambulatory Visit: Payer: Self-pay | Admitting: Family Medicine

## 2018-05-31 ENCOUNTER — Other Ambulatory Visit (INDEPENDENT_AMBULATORY_CARE_PROVIDER_SITE_OTHER): Payer: BLUE CROSS/BLUE SHIELD

## 2018-05-31 DIAGNOSIS — G40909 Epilepsy, unspecified, not intractable, without status epilepticus: Secondary | ICD-10-CM | POA: Diagnosis not present

## 2018-05-31 DIAGNOSIS — E538 Deficiency of other specified B group vitamins: Secondary | ICD-10-CM | POA: Diagnosis not present

## 2018-05-31 DIAGNOSIS — Z13220 Encounter for screening for lipoid disorders: Secondary | ICD-10-CM

## 2018-05-31 DIAGNOSIS — E559 Vitamin D deficiency, unspecified: Secondary | ICD-10-CM

## 2018-05-31 DIAGNOSIS — R7989 Other specified abnormal findings of blood chemistry: Secondary | ICD-10-CM

## 2018-05-31 NOTE — Progress Notes (Deleted)
[facility]  PATIENT: Elijah Huang DOB: [birth year]   REASON FOR VISIT: Follow-up for seizure disorder and migraine HISTORY FROM: Patient    HISTORY OF PRESENT ILLNESS: HISTORY Elijah Huang is a 37 years old right-handed Caucasian male, accompanied by his girlfriend, referred by his primary care physician Dr. Danise Mina for evaluation of possible seizure, with his girl friend AprilHe had past medical history of GERD, erectile dysfunction He presented with seizure-like event, the initial episode was witness by his girlfriend, Dec 07 2013, after waking up talking with his girlfriend for a while, shortly afterwards, he was noticed to lie on his left side, body shaking, loss of consciousness, lasting for a few minutes, with tongue biting, lip numbness, next day, in May 30th 2015, while taking a shower, his girlfriend heard a loud thundering, he was sitting in the shower, confused, did not know what has happened, no self injury, He denied previous history of seizure, he presented to the emergency room Dec 09 2013, with constellation of complaints, intermittent chest pain, blurry vision, left eye pain, lightheadedness, He had a CAT scan of the brain that was normal, laboratory showed normal CBC, CMP, negative troponin, 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 Sept 24th 2015: At work, he became  unresponsive to his manager, staring, hand tremor, mild urinary incontinence He has no collection of hte events, no warning signs,  He noticed side effects on Depakote this includes weight gain, increased bilateral hands tremor , Depakote was stop in Nov 2015, started Keppra  UPDATE October 30 2014:He is doing well with keppra 1500mg  bid , Topamax 100mg  bid, no recurrent seizure, rarely has headache, Maxalt was helpful.  UPDATE 04/30/2015 CMMr. Huang, 37 year old male returns for follow-up he has been seen in the past by Dr. Krista Blue in our clinic . He has a history of generalized seizure disorder and migraine headaches. He is currently on Keppra 1500 mg twice daily and Topamax 100mg  twice daily. Last seizure was 05/22/2014. He continues to have an occasional headache which is usually relieved with Maxalt. He needs refills on Maxalt. He is not aware of any food triggers. He returns for reevaluation  UPDATE 07/24/2015. CMMr. Huang, 37 year old male returns for follow-up. He has a history of seizure disorder and migraines. His migraines are in excellent control on Topamax 100 twice daily and Maxalt acutely. His last seizure event occurred in November 2015. He was last seen in the office 10/6/ 2016. At that time he was doing well on Keppra however he called in to the office several weeks ago with increased irritability and tiredness on the drug. He returns today to talk about changing his seizure medications. He has previously been on Depakote with continued seizures. His previous seizure episodes are described as his body gets stiff with jerking of the extremities and loss of consciousness. These have been witnessed by his girlfriend in the past.  He returns for reevaluation UPDATE 04/19/2017CM Mr. Huang, 37 year old male returns for follow-up he was last seen in the office 07/24/2015. He has history of migraines that are in excellent control with Topamax also has a history of seizure disorder some increased irritability  and tiredness on Keppra reported at last visit. He was successfully switched to Vimpat and has tapered off of Keppra without difficulty. He claims he feels much better. He returns for reevaluation no further seizure activity. UPDATE 10/31/2017CM Mr. Huang, 37 year old male returns for follow-up. He has a history of seizure disorder with last seizure event occurring in November 2015. He is currently on Vimpat. He has history of migraines and is on Topamax 100 mg twice daily Maxalt acutely. His headaches are in excellent control and he rarely uses Maxalt. He just got tired by the [facility]. He returns for reevaluation  UPDATE 11/19/2018CM Mr. Huang, 37 year old male returns for follow-up with a history of seizure disorder.  Last seizure occurred in 2015.  In addition he has history of migraines.  He was on Vimpat and Topamax when last seen however his insurance changed and he could not afford the Vimpat so his Topamax was increased.  He is currently taking 300 mg total dose daily.  He is now working third shift, and complains of inability to go to sleep.  He is getting about 3-4 hours of sleep daily and his headaches and have increased since he has been on shift work.  His primary care has given him some information on good sleep habits and hygiene.  He was made aware that lack of sleep is not only a migraine trigger but a seizure trigger as well.  Maxalt works acutely.  He returns for reevaluation  REVIEW OF SYSTEMS: Full 14 system review of systems performed and notable only for those listed, all others are neg:  Constitutional: neg  Cardiovascular: neg Ear/Nose/Throat: neg  Skin: neg Eyes: neg Respiratory: neg Gastroitestinal: neg  Hematology/Lymphatic: neg  Endocrine: neg Musculoskeletal:neg Allergy/Immunology: neg Neurological: History of seizure disorder and migraine Psychiatric: neg Sleep : Shift work disorder, frequent waking   ALLERGIES: Allergies  Allergen Reactions  . Keppra  [Levetiracetam] Other (See Comments)    irritability    HOME MEDICATIONS: Outpatient Medications Prior to Visit  Medication Sig Dispense Refill  . cetirizine (ZYRTEC) 10 MG tablet Take 10 mg by mouth daily as needed.     . Cholecalciferol (VITAMIN D3) 1000 units CAPS Take 1 capsule (1,000 Units total) by mouth daily. 30 capsule   . cyanocobalamin (V-R VITAMIN B-12) 500 MCG tablet Take 1 tablet (500 mcg total) by mouth every Monday, Wednesday, and Friday.    Marland Kitchen omeprazole (PRILOSEC) 40 MG capsule TAKE 1 CAPSULE BY MOUTH EVERY MONDAY WEDNESDAY AND FRIDAY 30 capsule 1  . rizatriptan (MAXALT-MLT) 5 MG disintegrating tablet DISSOLVE 1 TABLET IN MOUTH AS NEEDED. MAY REPEAT IN 2 HOURS IF NEEDED 15 tablet 11  . tadalafil (CIALIS) 20 MG tablet Take 1 tablet (20 mg total) by mouth every other day as needed for erectile dysfunction. 18 tablet 3  . topiramate (TOPAMAX) 100 MG tablet Take one tab in am and two tabs in pm. 90 tablet 11   No facility-administered medications prior to visit.     PAST MEDICAL HISTORY: Past Medical History:  Diagnosis Date  . 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 GERD (gastroesophageal reflux disease)   . History of chicken pox   . Migraines   .  Seizure disorder ([place]) 2015   presumed partial seizures, started on depakote, rec no driving for 6 mo (03/3266, Dr Addison Lank)  . Seizures ([place])     PAST SURGICAL HISTORY: Past Surgical History:  Procedure Laterality Date  . TONSILLECTOMY  1989    FAMILY HISTORY: Family History  Problem Relation Age of Onset  . Diabetes Father   . Coronary artery disease Father 29       cabg 8, nonsmoker, obese  . Cancer Father 71       prostate  . Coronary artery disease Paternal Grandfather 91       deceased from same  . Cancer Maternal Grandfather        pancreatic  . Stroke Neg Hx     SOCIAL HISTORY: Social History   Socioeconomic History  . Marital status:  Married    Spouse name: Not on file  . Number of children: 0  . Years of education: 56  . Highest education level: Not on file  Occupational History  . Occupation: [employer]--works in [place]  . Financial resource strain: Not on file  . Food insecurity:    Worry: Not on file    Inability: Not on file  . Transportation needs:    Medical: Not on file    Non-medical: Not on file  Tobacco Use  . Smoking status: Never Smoker  . Smokeless tobacco: Former Systems developer    Types: Snuff  . Tobacco comment: occasional snuff use per chart  Substance and Sexual Activity  . Alcohol use: Yes    Alcohol/week: 0.0 standard drinks    Comment: occasional  . Drug use: No  . Sexual activity: Not on file  Lifestyle  . Physical activity:    Days per week: Not on file    Minutes per session: Not on file  . Stress: Not on file  Relationships  . Social connections:    Talks on phone: Not on file    Gets together: Not on file    Attends religious service: Not on file    Active member of club or organization: Not on file    Attends meetings of clubs or organizations: Not on file    Relationship status: Not on file  . Intimate partner violence:    Fear of current or ex partner: Not on file    Emotionally abused: Not on file    Physically abused: Not on file    Forced sexual activity: Not on file  Other Topics Concern  . Not on file  Social History Narrative   Caffeine: 1-2 soft drinks/day   Divorced    Lives with his girlfriend  1 dog   Occupation: works at [employer]: no regular activity   Diet: good amt water, lots of fried foods, few fruits/vegetables.     PHYSICAL EXAM  There were no vitals filed for this visit. There is no height or weight on file to calculate BMI. Generalized: Well developed, in no acute distress  Head: normocephalic and atraumatic,. Oropharynx benign  Neck: Supple,  Musculoskeletal: No deformity   Neurological examination    Mentation: Alert oriented to time, place, history taking. Attention span and concentration appropriate. Recent and remote memory intact. Follows all commands speech and language fluent.   Cranial nerve II-XII: Pupils were equal round reactive to light extraocular movements were full, visual field were full on confrontational test. Facial sensation and strength were normal. hearing was intact to finger rubbing bilaterally.  Uvula tongue midline. head turning and shoulder shrug were normal and symmetric.Tongue protrusion into cheek strength was normal. Motor: normal bulk and tone, full strength in the BUE, BLE, Sensory: normal and symmetric to light touch,  Coordination: finger-nose-finger, heel-to-shin bilaterally, no dysmetria Reflexes: Brachioradialis 2/2, biceps 2/2, triceps 2/2, patellar 2/2, Achilles 2/2, plantar responses were flexor bilaterally. Gait and Station: Rising up from seated position without assistance, normal stance, moderate stride, good arm swing, smooth turning, able to perform tiptoe, and heel walking without difficulty. Tandem gait is steady  DIAGNOSTIC DATA (LABS, IMAGING, TESTING) -  ASSESSMENT AND PLAN  37 y.o. year old male  has a past medical history of  Seizure disorder ([place]) (2015). and migraine here to follow-up. He had side effects to Keppra irritability that was successfully transitioned to Vimpat.  He has stopped Vimpat since last seen due to expense .He remains on Topamax for his migraines.  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  Continue Topamax at current doses. Will refill Continue Maxalt prn will refill Order sleep study for shift work disorder/insomnia Lack of sleep is not only and migraine triggered by a seizure trigger, discussed sleep hygiene Call for increase in headaches or any seizure activity F/U  yearly Dennie Bible, [facility], [facility], [facility] [address] [place], [place]  [address] [phone]

## 2018-06-01 ENCOUNTER — Ambulatory Visit: Payer: BLUE CROSS/BLUE SHIELD | Admitting: Nurse Practitioner

## 2018-06-01 LAB — CBC WITH DIFFERENTIAL/PLATELET
Basophils Absolute: 0.1 10*3/uL (ref 0.0–0.1)
Basophils Relative: 1.2 % (ref 0.0–3.0)
EOS ABS: 0.1 10*3/uL (ref 0.0–0.7)
EOS PCT: 2.2 % (ref 0.0–5.0)
HCT: 45.1 % (ref 39.0–52.0)
HEMOGLOBIN: 15.7 g/dL (ref 13.0–17.0)
LYMPHS ABS: 2.3 10*3/uL (ref 0.7–4.0)
Lymphocytes Relative: 36.7 % (ref 12.0–46.0)
MCHC: 34.7 g/dL (ref 30.0–36.0)
MCV: 84 fl (ref 78.0–100.0)
MONO ABS: 0.4 10*3/uL (ref 0.1–1.0)
Monocytes Relative: 7 % (ref 3.0–12.0)
NEUTROS PCT: 52.9 % (ref 43.0–77.0)
Neutro Abs: 3.3 10*3/uL (ref 1.4–7.7)
Platelets: 314 10*3/uL (ref 150.0–400.0)
RBC: 5.37 Mil/uL (ref 4.22–5.81)
RDW: 14.5 % (ref 11.5–15.5)
WBC: 6.2 10*3/uL (ref 4.0–10.5)

## 2018-06-01 LAB — VITAMIN D 25 HYDROXY (VIT D DEFICIENCY, FRACTURES): VITD: 27.45 ng/mL — ABNORMAL LOW (ref 30.00–100.00)

## 2018-06-01 LAB — BASIC METABOLIC PANEL
BUN: 24 mg/dL — AB (ref 6–23)
CO2: 24 mEq/L (ref 19–32)
CREATININE: 1.41 mg/dL (ref 0.40–1.50)
Calcium: 9.7 mg/dL (ref 8.4–10.5)
Chloride: 110 mEq/L (ref 96–112)
GFR: 60.66 mL/min (ref >60.00)
GLUCOSE: 85 mg/dL (ref 70–99)
Potassium: 4.1 mEq/L (ref 3.5–5.1)
Sodium: 143 mEq/L (ref 135–145)

## 2018-06-01 LAB — LIPID PANEL
CHOLESTEROL: 166 mg/dL (ref 0–200)
HDL: 68.3 mg/dL (ref >39.00)
LDL CALC: 73 mg/dL (ref 0–99)
NonHDL: 97.28
TRIGLYCERIDES: 120 mg/dL (ref 0.0–149.0)
Total CHOL/HDL Ratio: 2
VLDL: 24 mg/dL (ref 0.0–40.0)

## 2018-06-01 LAB — VITAMIN B12: Vitamin B-12: 292 pg/mL (ref 211–911)

## 2018-06-07 ENCOUNTER — Encounter: Payer: Self-pay | Admitting: Family Medicine

## 2018-06-07 ENCOUNTER — Ambulatory Visit (INDEPENDENT_AMBULATORY_CARE_PROVIDER_SITE_OTHER): Payer: BLUE CROSS/BLUE SHIELD | Admitting: Family Medicine

## 2018-06-07 VITALS — BP 116/70 | HR 88 | Temp 98.6°F | Ht 71.25 in | Wt 178.5 lb

## 2018-06-07 DIAGNOSIS — Z8249 Family history of ischemic heart disease and other diseases of the circulatory system: Secondary | ICD-10-CM

## 2018-06-07 DIAGNOSIS — E538 Deficiency of other specified B group vitamins: Secondary | ICD-10-CM

## 2018-06-07 DIAGNOSIS — K219 Gastro-esophageal reflux disease without esophagitis: Secondary | ICD-10-CM

## 2018-06-07 DIAGNOSIS — G40909 Epilepsy, unspecified, not intractable, without status epilepticus: Secondary | ICD-10-CM | POA: Diagnosis not present

## 2018-06-07 DIAGNOSIS — R7989 Other specified abnormal findings of blood chemistry: Secondary | ICD-10-CM

## 2018-06-07 DIAGNOSIS — Z0000 Encounter for general adult medical examination without abnormal findings: Secondary | ICD-10-CM | POA: Diagnosis not present

## 2018-06-07 DIAGNOSIS — E559 Vitamin D deficiency, unspecified: Secondary | ICD-10-CM

## 2018-06-07 DIAGNOSIS — G43009 Migraine without aura, not intractable, without status migrainosus: Secondary | ICD-10-CM

## 2018-06-07 DIAGNOSIS — L989 Disorder of the skin and subcutaneous tissue, unspecified: Secondary | ICD-10-CM

## 2018-06-07 MED ORDER — OMEPRAZOLE 40 MG PO CPDR: DELAYED_RELEASE_CAPSULE | ORAL | 10 refills | Status: DC

## 2018-06-07 NOTE — Assessment & Plan Note (Signed)
Chronic, seizure free on topamax continue f/u with neurology

## 2018-06-07 NOTE — Patient Instructions (Addendum)
Check with insurance if they will cover Lipoprotein A for family history of premature heart disease.  You are doing well today Return as needed or in 1 year for next physical. Restart vitamin D 1000 units daily and vitamin b12 every other day.   Health Maintenance, Male A healthy lifestyle and preventive care is important for your health and wellness. Ask your health care provider about what schedule of regular examinations is right for you. What should I know about weight and diet? Eat a Healthy Diet  Eat plenty of vegetables, fruits, whole grains, low-fat dairy products, and lean protein.  Do not eat a lot of foods high in solid fats, added sugars, or salt.  Maintain a Healthy Weight Regular exercise can help you achieve or maintain a healthy weight. You should:  Do at least 150 minutes of exercise each week. The exercise should increase your heart rate and make you sweat (moderate-intensity exercise).  Do strength-training exercises at least twice a week.  Watch Your Levels of Cholesterol and Blood Lipids  Have your blood tested for lipids and cholesterol every 5 years starting at 35 years of age. If you are at high risk for heart disease, you should start having your blood tested when you are 35 years old. You may need to have your cholesterol levels checked more often if: ? Your lipid or cholesterol levels are high. ? You are older than 35 years of age. ? You are at high risk for heart disease.  What should I know about cancer screening? Many types of cancers can be detected early and may often be prevented. Lung Cancer  You should be screened every year for lung cancer if: ? You are a current smoker who has smoked for at least 30 years. ? You are a former smoker who has quit within the past 15 years.  Talk to your health care provider about your screening options, when you should start screening, and how often you should be screened.  Colorectal Cancer  Routine  colorectal cancer screening usually begins at 35 years of age and should be repeated every 5-10 years until you are 35 years old. You may need to be screened more often if early forms of precancerous polyps or small growths are found. Your health care provider may recommend screening at an earlier age if you have risk factors for colon cancer.  Your health care provider may recommend using home test kits to check for hidden blood in the stool.  A small camera at the end of a tube can be used to examine your colon (sigmoidoscopy or colonoscopy). This checks for the earliest forms of colorectal cancer.  Prostate and Testicular Cancer  Depending on your age and overall health, your health care provider may do certain tests to screen for prostate and testicular cancer.  Talk to your health care provider about any symptoms or concerns you have about testicular or prostate cancer.  Skin Cancer  Check your skin from head to toe regularly.  Tell your health care provider about any new moles or changes in moles, especially if: ? There is a change in a mole's size, shape, or color. ? You have a mole that is larger than a pencil eraser.  Always use sunscreen. Apply sunscreen liberally and repeat throughout the day.  Protect yourself by wearing long sleeves, pants, a wide-brimmed hat, and sunglasses when outside.  What should I know about heart disease, diabetes, and high blood pressure?  If you are 18-39  years of age, have your blood pressure checked every 3-5 years. If you are 54 years of age or older, have your blood pressure checked every year. You should have your blood pressure measured twice-once when you are at a hospital or clinic, and once when you are not at a hospital or clinic. Record the average of the two measurements. To check your blood pressure when you are not at a hospital or clinic, you can use: ? An automated blood pressure machine at a pharmacy. ? A home blood pressure  monitor.  Talk to your health care provider about your target blood pressure.  If you are between 58-28 years old, ask your health care provider if you should take aspirin to prevent heart disease.  Have regular diabetes screenings by checking your fasting blood sugar level. ? If you are at a normal weight and have a low risk for diabetes, have this test once every three years after the age of 34. ? If you are overweight and have a high risk for diabetes, consider being tested at a younger age or more often.  A one-time screening for abdominal aortic aneurysm (AAA) by ultrasound is recommended for men aged 39-75 years who are current or former smokers. What should I know about preventing infection? Hepatitis B If you have a higher risk for hepatitis B, you should be screened for this virus. Talk with your health care provider to find out if you are at risk for hepatitis B infection. Hepatitis C Blood testing is recommended for:  Everyone born from [birth year] through 1963.  Anyone with known risk factors for hepatitis C.  Sexually Transmitted Diseases (STDs)  You should be screened each year for STDs including gonorrhea and chlamydia if: ? You are sexually active and are younger than 35 years of age. ? You are older than 35 years of age and your health care provider tells you that you are at risk for this type of infection. ? Your sexual activity has changed since you were last screened and you are at an increased risk for chlamydia or gonorrhea. Ask your health care provider if you are at risk.  Talk with your health care provider about whether you are at high risk of being infected with HIV. Your health care provider may recommend a prescription medicine to help prevent HIV infection.  What else can I do?  Schedule regular health, dental, and eye exams.  Stay current with your vaccines (immunizations).  Do not use any tobacco products, such as cigarettes, chewing tobacco, and  e-cigarettes. If you need help quitting, ask your health care provider.  Limit alcohol intake to no more than 2 drinks per day. One drink equals 12 ounces of beer, 5 ounces of wine, or 1 ounces of hard liquor.  Do not use street drugs.  Do not share needles.  Ask your health care provider for help if you need support or information about quitting drugs.  Tell your health care provider if you often feel depressed.  Tell your health care provider if you have ever been abused or do not feel safe at home. This information is not intended to replace advice given to you by your health care provider. Make sure you discuss any questions you have with your health care provider. Document Released: 12/25/2007 Document Revised: 02/25/2016 Document Reviewed: 04/01/2015 Elsevier Interactive Patient Education  [employer].

## 2018-06-07 NOTE — Assessment & Plan Note (Signed)
Restart 557mcg QOD

## 2018-06-07 NOTE — Assessment & Plan Note (Signed)
Restart 1000 IU oral daily

## 2018-06-07 NOTE — Progress Notes (Signed)
BP 116/70 (BP Location: Left Arm, Patient Position: Sitting, Cuff Size: Normal)   Pulse 88   Temp 98.6 F (37 C) (Oral)   Ht 5' 11.25" (1.81 m)   Wt 178 lb 8 oz (81 kg)   SpO2 97%   BMI 24.72 kg/m    CC: CPE Subjective:    Patient ID: Elijah Huang, male    DOB: 04-Apr-1981, 37 y.o.   MRN: [id number]  HPI: Elijah Huang is a 37 y.o. male presenting on 06/07/2018 for Annual Exam (Wants to discuss Cialis, vit B12 and vit D3.)   Followed by neurology for seizures and migraines. Now on topamax to control both. Seizure free, infrequent migraines treated with maxalt.  Not needing cialis recently.  GERD - controlled on MWF PPI.  Hasn't been taking vit D and B12  Preventative: Flu shot yearly Tdap 2012 Seat belt use discussed Sunscreen use discussed. No changing moles on skin. Growing skin tag L leg Non smoker Alcohol - occasional on weekends.  Dentist Q6 mo Eye exam yearly  Caffeine: 1-2 soft drinks/day  Divorced.  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 Interim medical history since our last visit reviewed. Allergies and medications reviewed and updated. Outpatient Medications Prior to Visit  Medication Sig Dispense Refill  . cetirizine (ZYRTEC) 10 MG tablet Take 10 mg by mouth daily as needed.     . rizatriptan (MAXALT-MLT) 5 MG disintegrating tablet DISSOLVE 1 TABLET IN MOUTH AS NEEDED. MAY REPEAT IN 2 HOURS IF NEEDED 15 tablet 11  . topiramate (TOPAMAX) 100 MG tablet Take one tab in am and two tabs in pm. 90 tablet 11  . omeprazole (PRILOSEC) 40 MG capsule TAKE 1 CAPSULE BY MOUTH EVERY MONDAY WEDNESDAY AND FRIDAY 30 capsule 1  . Cholecalciferol (VITAMIN D3) 1000 units CAPS Take 1 capsule (1,000 Units total) by mouth daily. (Patient not  taking: Reported on 06/07/2018) 30 capsule   . cyanocobalamin (V-R VITAMIN B-12) 500 MCG tablet Take 1 tablet (500 mcg total) by mouth every Monday, Wednesday, and Friday. (Patient not taking: Reported on 06/07/2018)    . tadalafil (CIALIS) 20 MG tablet Take 1 tablet (20 mg total) by mouth every other day as needed for erectile dysfunction. (Patient not taking: Reported on 06/07/2018) 18 tablet 3   No facility-administered medications prior to visit.      Per HPI unless specifically indicated in ROS section below Review of Systems  Constitutional: Negative for activity change, appetite change, chills, fatigue, fever and unexpected weight change.  HENT: Negative for hearing loss.   Eyes: Negative for visual disturbance.  Respiratory: Negative for cough, chest tightness, shortness of breath and wheezing.   Cardiovascular: Negative for chest pain, palpitations and leg swelling.  Gastrointestinal: Negative for abdominal distention, abdominal pain, blood in stool, constipation, diarrhea, nausea and vomiting.  Genitourinary: Negative for difficulty urinating and hematuria.  Musculoskeletal: Negative for arthralgias, myalgias and neck pain.  Skin: Negative for rash.  Neurological: Negative for dizziness, seizures, syncope and headaches.  Hematological: Negative for adenopathy. Does not bruise/bleed easily.  Psychiatric/Behavioral: Negative for dysphoric mood. The patient is not nervous/anxious.        Objective:    BP 116/70 (BP Location: Left Arm, Patient Position: Sitting, Cuff Size: Normal)   Pulse 88  Temp 98.6 F (37 C) (Oral)   Ht 5' 11.25" (1.81 m)   Wt 178 lb 8 oz (81 kg)   SpO2 97%   BMI 24.72 kg/m   Wt Readings from Last 3 Encounters:  06/07/18 178 lb 8 oz (81 kg)  05/30/17 176 lb (79.8 kg)  04/28/17 175 lb (79.4 kg)    Physical Exam  Constitutional: He is oriented to person, place, and time. He appears well-developed and well-nourished. No distress.  HENT:  Head:  Normocephalic and atraumatic.  Right Ear: Hearing, tympanic membrane, external ear and ear canal normal.  Left Ear: Hearing, tympanic membrane, external ear and ear canal normal.  Nose: Nose normal.  Mouth/Throat: Uvula is midline, oropharynx is clear and moist and mucous membranes are normal. No oropharyngeal exudate, posterior oropharyngeal edema or posterior oropharyngeal erythema.  Eyes: Pupils are equal, round, and reactive to light. Conjunctivae and EOM are normal. No scleral icterus.  Neck: Normal range of motion. Neck supple.  Cardiovascular: Normal rate, regular rhythm, normal heart sounds and intact distal pulses.  No murmur heard. Pulses:      Radial pulses are 2+ on the right side, and 2+ on the left side.  Pulmonary/Chest: Effort normal and breath sounds normal. No respiratory distress. He has no wheezes. He has no rales.  Abdominal: Soft. Bowel sounds are normal. He exhibits no distension and no mass. There is no tenderness. There is no rebound and no guarding.  Musculoskeletal: Normal range of motion. He exhibits no edema.  Lymphadenopathy:    He has no cervical adenopathy.  Neurological: He is alert and oriented to person, place, and time.  CN grossly intact, station and gait intact  Skin: Skin is warm and dry. No rash noted.  Multiple nevi throughout body Skin growth R posterior thigh   Psychiatric: He has a normal mood and affect. His behavior is normal. Judgment and thought content normal.  Nursing note and vitals reviewed.  Results for orders placed or performed in visit on 05/31/18  VITAMIN D 25 Hydroxy (Vit-D Deficiency, Fractures)  Result Value Ref Range   VITD 27.45 (L) 30.00 - 100.00 ng/mL  Vitamin B12  Result Value Ref Range   Vitamin B-12 292 211 - 911 pg/mL  Basic metabolic panel  Result Value Ref Range   Sodium 143 135 - 145 mEq/L   Potassium 4.1 3.5 - 5.1 mEq/L   Chloride 110 96 - 112 mEq/L   CO2 24 19 - 32 mEq/L   Glucose, Bld 85 70 - 99 mg/dL    BUN 24 (H) 6 - 23 mg/dL   Creatinine, Ser 1.41 0.40 - 1.50 mg/dL   Calcium 9.7 8.4 - 10.5 mg/dL   GFR 60.66 >60.00 mL/min  CBC with Differential/Platelet  Result Value Ref Range   WBC 6.2 4.0 - 10.5 K/uL   RBC 5.37 4.22 - 5.81 Mil/uL   Hemoglobin 15.7 13.0 - 17.0 g/dL   HCT 45.1 39.0 - 52.0 %   MCV 84.0 78.0 - 100.0 fl   MCHC 34.7 30.0 - 36.0 g/dL   RDW 14.5 11.5 - 15.5 %   Platelets 314.0 150.0 - 400.0 K/uL   Neutrophils Relative % 52.9 43.0 - 77.0 %   Lymphocytes Relative 36.7 12.0 - 46.0 %   Monocytes Relative 7.0 3.0 - 12.0 %   Eosinophils Relative 2.2 0.0 - 5.0 %   Basophils Relative 1.2 0.0 - 3.0 %   Neutro Abs 3.3 1.4 - 7.7 K/uL   Lymphs Abs 2.3  0.7 - 4.0 K/uL   Monocytes Absolute 0.4 0.1 - 1.0 K/uL   Eosinophils Absolute 0.1 0.0 - 0.7 K/uL   Basophils Absolute 0.1 0.0 - 0.1 K/uL  Lipid panel  Result Value Ref Range   Cholesterol 166 0 - 200 mg/dL   Triglycerides 120.0 0.0 - 149.0 mg/dL   HDL 68.30 >39.00 mg/dL   VLDL 24.0 0.0 - 40.0 mg/dL   LDL Cholesterol 73 0 - 99 mg/dL   Total CHOL/HDL Ratio 2    NonHDL 97.28       Assessment & Plan:   Problem List Items Addressed This Visit    Vitamin D deficiency    Restart 1000 IU oral daily      Seizure disorder (HCC)    Chronic, seizure free on topamax continue f/u with neurology       Migraine without aura and without status migrainosus, not intractable   Low vitamin B12 level    Restart 538mcg QOD      Healthcare maintenance - Primary    Preventative protocols reviewed and updated unless pt declined. Discussed healthy diet and lifestyle.       GERD (gastroesophageal reflux disease)    Chronic, controlled on MWF PPI      Relevant Medications   omeprazole (PRILOSEC) 40 MG capsule   Family history of early CAD    Lipid levels normal.  I asked him to check if insurance will cover lipoprotein a for further risk stratification.      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           Meds ordered this encounter  Medications  . omeprazole (PRILOSEC) 40 MG capsule    Sig: TAKE 1 CAPSULE BY MOUTH EVERY MONDAY WEDNESDAY AND FRIDAY    Dispense:  30 capsule    Refill:  10   No orders of the defined types were placed in this encounter.   Follow up plan: Return in about 1 year (around 06/08/2019) for annual exam, prior fasting for blood work.  Ria Bush, MD

## 2018-06-07 NOTE — Assessment & Plan Note (Signed)
Chronic, controlled on MWF PPI

## 2018-06-07 NOTE — Assessment & Plan Note (Signed)
Preventative protocols reviewed and updated unless pt declined. Discussed healthy diet and lifestyle.  

## 2018-06-07 NOTE — Assessment & Plan Note (Signed)
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

## 2018-06-07 NOTE — Assessment & Plan Note (Signed)
Lipid levels normal.  I asked him to check if insurance will cover lipoprotein a for further risk stratification.

## 2018-08-28 ENCOUNTER — Encounter: Payer: Self-pay | Admitting: Family Medicine

## 2018-08-28 ENCOUNTER — Ambulatory Visit: Payer: BLUE CROSS/BLUE SHIELD | Admitting: Family Medicine

## 2018-08-28 VITALS — BP 140/80 | HR 87 | Temp 98.5°F | Ht 71.5 in | Wt 186.8 lb

## 2018-08-28 DIAGNOSIS — J22 Unspecified acute lower respiratory infection: Secondary | ICD-10-CM

## 2018-08-28 MED ORDER — AMOXICILLIN 875 MG PO TABS: 875.0000 mg | ORAL_TABLET | Freq: Two times a day (BID) | ORAL | 0 refills | Status: DC

## 2018-08-28 NOTE — Progress Notes (Signed)
Subjective:    Patient ID: Elijah Huang, male    DOB: 21-Feb-1981, 38 y.o.   MRN: [id number]  HPI This is a 38 yo male who presents today with 2 weeks of nasal congestion. Moved to his chest and he has been coughing up some colored phlegm. Tired and run down, sore throat with swallowing. No fever. No SOB or wheeze, coughing at night. No ear pain, mild headache, +sick contacts. Got better several days ago then got worse.  Has been taking Claritin D, Mucinex combination.   Past Medical History:  Diagnosis Date  . 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 GERD (gastroesophageal reflux disease)   . History of chicken pox   . Migraines   . Seizure disorder ([place]) 2015   presumed partial seizures, started on depakote, rec no driving for 6 mo (12/3873, Dr Elijah Huang)   Past Surgical History:  Procedure Laterality Date  . TONSILLECTOMY  1989   Family History  Problem Relation Age of Onset  . Diabetes Father   . Coronary artery disease Father 63       cabg 39, nonsmoker, obese  . Cancer Father 6       prostate  . Coronary artery disease Paternal Grandfather 39       deceased from same  . Cancer Maternal Grandfather        pancreatic  . Stroke Neg Hx    Social History   Tobacco Use  . Smoking status: Never Smoker  . Smokeless tobacco: Former Systems developer    Types: Snuff  . Tobacco comment: occasional snuff use per chart  Substance Use Topics  . Alcohol use: Yes    Alcohol/week: 0.0 standard drinks    Comment: occasional  . Drug use: No      Review of Systems Per HPI    Objective:   Physical Exam Vitals signs reviewed.  Constitutional:      General: He is not in acute distress.    Appearance: He is well-developed and normal weight. He is ill-appearing. He is not toxic-appearing or diaphoretic.  HENT:     Head: Normocephalic and atraumatic.     Right Ear: Tympanic membrane and ear canal normal.     Left Ear: Tympanic  membrane and ear canal normal.     Nose: Congestion and rhinorrhea present.     Mouth/Throat:     Mouth: Mucous membranes are moist.     Pharynx: Oropharynx is clear. Uvula midline. No oropharyngeal exudate or posterior oropharyngeal erythema.  Eyes:     Conjunctiva/sclera: Conjunctivae normal.  Neck:     Musculoskeletal: Normal range of motion and neck supple.  Cardiovascular:     Rate and Rhythm: Normal rate and regular rhythm.     Heart sounds: Normal heart sounds.  Pulmonary:     Effort: Pulmonary effort is normal.     Breath sounds: Normal breath sounds.  Skin:    General: Skin is warm and dry.  Neurological:     Mental Status: He is alert and oriented to person, place, and time.  Psychiatric:        Mood and Affect: Mood normal.        Behavior: Behavior normal.       BP 140/80   Pulse 87   Temp 98.5 F (36.9 C) (Oral)   Ht 5' 11.5" (1.816 m)   Wt 186 lb 12.8 oz (84.7 kg)   SpO2 99%  BMI 25.69 kg/m      BP Readings from Last 3 Encounters:  08/28/18 140/80  06/07/18 116/70  05/30/17 119/77    Assessment & Plan:  1. Lower respiratory infection - given duration, improvement then worsening, no relief with adequate symptomatic treatment, will cover for bacterial etiology - Provided written and verbal information regarding diagnosis and treatment. - RTC precautions reveiwed - amoxicillin (AMOXIL) 875 MG tablet; Take 1 tablet (875 mg total) by mouth 2 (two) times daily.  Dispense: 14 tablet; Refill: 0   Elijah Reamer, FNP-BC  [facility]  08/28/2018 9:15 AM

## 2018-08-28 NOTE — Patient Instructions (Signed)
Continue your over the counter medications, I have sent in an antibiotic to your pharmacy  If not better in 5-7 days or if worse, please follow up

## 2018-08-28 NOTE — Progress Notes (Signed)
Pre visit review using our clinic review tool, if applicable. No additional management support is needed unless otherwise documented below in the visit note. 

## 2018-10-10 ENCOUNTER — Telehealth: Payer: Self-pay | Admitting: Neurology

## 2018-10-10 NOTE — Telephone Encounter (Signed)
Patient called in and stated he can do the virtual telephone visit at 3:15

## 2018-10-10 NOTE — Telephone Encounter (Signed)
Got it. Thank you. His appointment is 4/1 3:15.

## 2018-10-10 NOTE — Telephone Encounter (Signed)
I called patient to get set up for virtual visit. I left a message.

## 2018-10-11 ENCOUNTER — Ambulatory Visit (INDEPENDENT_AMBULATORY_CARE_PROVIDER_SITE_OTHER): Payer: BLUE CROSS/BLUE SHIELD | Admitting: Neurology

## 2018-10-11 ENCOUNTER — Other Ambulatory Visit: Payer: Self-pay

## 2018-10-11 ENCOUNTER — Encounter: Payer: Self-pay | Admitting: Neurology

## 2018-10-11 DIAGNOSIS — Z5181 Encounter for therapeutic drug level monitoring: Secondary | ICD-10-CM | POA: Diagnosis not present

## 2018-10-11 DIAGNOSIS — R569 Unspecified convulsions: Secondary | ICD-10-CM | POA: Diagnosis not present

## 2018-10-11 DIAGNOSIS — G43909 Migraine, unspecified, not intractable, without status migrainosus: Secondary | ICD-10-CM

## 2018-10-11 NOTE — Progress Notes (Addendum)
Virtual Visit via Video Note  I connected with [name] on 10/11/18 at  3:15 PM EDT by telephone application and verified that I am speaking with the correct person using two identifiers.   I discussed the limitations of evaluation and management by telemedicine and the availability of in person appointments. The patient expressed understanding and agreed to proceed.  History of Present Illness: HISTORY Elijah Huang is a 38 years old right-handed Caucasian male, accompanied by his girlfriend, referred by his primary care physician Dr. Danise Mina for evaluation of possible seizure, with his girl friend AprilHe had past medical history of GERD, erectile dysfunction He presented with seizure-like event, the initial episode was witness by his girlfriend, Dec 07 2013, after waking up talking with his girlfriend for a while, shortly afterwards, he was noticed to lie on his left side, body shaking, loss of consciousness, lasting for a few minutes, with tongue biting, lip numbness, next day, in May 30th 2015, while taking a shower, his girlfriend heard a loud thundering, he was sitting in the shower, confused, did not know what has happened, no self injury, He denied previous history of seizure, he presented to the emergency room Dec 09 2013, with constellation of complaints, intermittent chest pain, blurry vision, left eye pain, lightheadedness, He had a CAT scan of the brain that was normal, laboratory showed normal CBC, CMP, negative troponin, He works as a Physiological scientist UPDATE Oct 14th 2015:YY While taking titrating dose of Depakote he continued to have recurrent sudden onset of lost of consciousness, MRI of the brain was normal, EEG was normal June 3rd 2015,, spaced out while talking with his nieced, lasting 2 minutes, no seizure activity  June 10th 2015: at work, become unresponsive, Depakote ER was increased from 500 mg one tablet a day, to 2 tablets every night July 2nd 2015, in the car  with his girl friend, spaced out, lasting 3-4 minues, staring, no body shaking, July 3rd 2015: goes into staring, noot responside, when he comes too, his hand tremor. Sept 24th 2015: At work, he became unresponsive to his manager, staring, hand tremor, mild urinary incontinence He has no collection of hte events, no warning signs,  He noticed side effects on Depakote this includes weight gain, increased bilateral hands tremor , Depakote was stop in Nov 2015, started Keppra  UPDATE October 30 2014:He is doing well with keppra 1500mg  bid , Topamax 100mg  bid, no recurrent seizure, rarely has headache, Maxalt was helpful.  UPDATE 04/30/2015 CMMr. Aubry, 38 year old male returns for follow-up he has been seen in the past by Dr. Krista Blue in our clinic . He has a history of generalized seizure disorder and migraine headaches. He is currently on Keppra 1500 mg twice daily and Topamax 100mg  twice daily. Last seizure was 05/22/2014. He continues to have an occasional headache which is usually relieved with Maxalt. He needs refills on Maxalt. He is not aware of any food triggers. He returns for reevaluation  UPDATE 07/24/2015. CMMr. Falor, 38 year old male returns for follow-up. He has a history of seizure disorder and migraines. His migraines are in excellent control on Topamax 100 twice daily and Maxalt acutely. His last seizure event occurred in November 2015. He was last seen in the office 10/6/ 2016. At that time he was doing well on Keppra however he called in to the office several weeks ago with increased irritability and tiredness on the drug. He returns today to talk about changing his seizure medications. He has previously been  on Depakote with continued seizures. His previous seizure episodes are described as his body gets stiff with jerking of the extremities and loss of consciousness. These have been witnessed by his girlfriend in the past. He returns for reevaluation UPDATE 04/19/2017CM Mr. Nurse, 38 year old  male returns for follow-up he was last seen in the office 07/24/2015. He has history of migraines that are in excellent control with Topamax also has a history of seizure disorder some increased irritability and tiredness on Keppra reported at last visit. He was successfully switched to Vimpat and has tapered off of Keppra without difficulty. He claims he feels much better. He returns for reevaluation no further seizure activity. UPDATE 10/31/2017CM Mr. Schlink, 39 year old male returns for follow-up. He has a history of seizure disorder with last seizure event occurring in November 2015. He is currently on Vimpat. He has history of migraines and is on Topamax 100 mg twice daily Maxalt acutely. His headaches are in excellent control and he rarely uses Maxalt. He just got tired by the [facility]. He returns for reevaluation  UPDATE 11/19/2018CM Mr. Voong, 38 year old male returns for follow-up with a history of seizure disorder.  Last seizure occurred in 2015.  In addition he has history of migraines.  He was on Vimpat and Topamax when last seen however his insurance changed and he could not afford the Vimpat so his Topamax was increased.  He is currently taking 300 mg total dose daily.  He is now working third shift, and complains of inability to go to sleep.  He is getting about 3-4 hours of sleep daily and his headaches and have increased since he has been on shift work.  His primary care has given him some information on good sleep habits and hygiene.  He was made aware that lack of sleep is not only a migraine trigger but a seizure trigger as well.  Maxalt works acutely.  He returns for reevaluation  Update October 11 2018 SS: Elijah Huang 38 year old male, history of seizures and migraines, taking Topamax 100 mg, 1 in the morning, 2 at bedtime.  He reports he may have had one episode of seizure last summer, but he is not sure it was a seizure.  He reports he was in the bed had just woken up, was drowsy,  remembers feeling like he could not new, lasting for a few seconds.  He reports it subsided, he felt drowsy but was able to move.  He denies any shaking or incontinence, oral trauma.  He reports in the past this is been similar to his previous seizure events.  He does report that he may have missed some of his Topamax dosage during that time.  He reports his initial seizure was in 2015, his most notable recent seizure was in 2017.  Reports his headaches have been under good control, has not been having to take Maxalt for several months.  He is currently working day shift, full-time, driving a car.  In the past he has been unable to tolerate Depakote or Keppra.  Previously he was on Topamax and Vimpat for seizures.  He stopped his Vimpat during a period of time when he lost his insurance and the medication was too expensive.  He denies any new problems or concerns.   Observations/Objective:  Alert, speech is clear, answers questions appropriately  Assessment and Plan: 1.  Seizures 2.  Migraine  He is currently taking Topamax, 100 mg in the morning, 200 mg at bedtime.  He reports  he is tolerating medication well.  He does describe an event last summer that could have been a seizure.  He is not sure.  He reports he thinks he did miss some doses of his Topamax at that time.  I reviewed the record, has had several staring episodes, initial seizure in 2015 sounds like a generalized tonic-clonic seizure.  I will check blood work.  I will discuss with Dr. Krista Blue, may need to add back the Vimpat.  He was taking Vimpat 50 mg twice daily. Advised him he should let us know of any recurrent seizure activity.  We discussed that he should not drive within 6 months of a seizure event.  He will come by the office tomorrow after work to get blood work.  He reports good control of his headaches, not having to use Maxalt often. He should be compliant with his medications. I will send refills when blood work results incase we  need to adjust medications.   Follow Up Instructions: 4 to 6 months for revisit   I discussed the assessment and treatment plan with the patient. The patient was provided an opportunity to ask questions and all were answered. The patient agreed with the plan and demonstrated an understanding of the instructions.   The patient was advised to call back or seek an in-person evaluation if the symptoms worsen or if the condition fails to improve as anticipated.  I provided 30 minutes of non-face-to-face time during this encounter.   Evangeline Dakin, DNP  [facility] [address] [place], [place] [address] [phone]  Addendum: reviewed, the spell happened in the setting of missing topamax 100/200mg , will keep current dose for now.

## 2018-10-12 ENCOUNTER — Other Ambulatory Visit: Payer: Self-pay

## 2018-10-12 ENCOUNTER — Other Ambulatory Visit (INDEPENDENT_AMBULATORY_CARE_PROVIDER_SITE_OTHER): Payer: Self-pay

## 2018-10-12 DIAGNOSIS — Z0289 Encounter for other administrative examinations: Secondary | ICD-10-CM

## 2018-10-12 DIAGNOSIS — Z5181 Encounter for therapeutic drug level monitoring: Secondary | ICD-10-CM

## 2018-10-12 NOTE — Progress Notes (Signed)
I have reviewed and agreed above plan. 

## 2018-10-13 LAB — CBC WITH DIFFERENTIAL/PLATELET
Basophils Absolute: 0 10*3/uL (ref 0.0–0.2)
Basos: 1 %
EOS (ABSOLUTE): 0.2 10*3/uL (ref 0.0–0.4)
Eos: 2 %
Hematocrit: 45.8 % (ref 37.5–51.0)
Hemoglobin: 16.1 g/dL (ref 13.0–17.7)
Immature Grans (Abs): 0 10*3/uL (ref 0.0–0.1)
Immature Granulocytes: 0 %
Lymphocytes Absolute: 2.4 10*3/uL (ref 0.7–3.1)
Lymphs: 34 %
MCH: 29.1 pg (ref 26.6–33.0)
MCHC: 35.2 g/dL (ref 31.5–35.7)
MCV: 83 fL (ref 79–97)
Monocytes Absolute: 0.5 10*3/uL (ref 0.1–0.9)
Monocytes: 8 %
Neutrophils Absolute: 3.8 10*3/uL (ref 1.4–7.0)
Neutrophils: 55 %
Platelets: 329 10*3/uL (ref 150–450)
RBC: 5.54 x10E6/uL (ref 4.14–5.80)
RDW: 13.1 % (ref 11.6–15.4)
WBC: 6.9 10*3/uL (ref 3.4–10.8)

## 2018-10-13 LAB — COMPREHENSIVE METABOLIC PANEL
ALT: 21 IU/L (ref 0–44)
AST: 16 IU/L (ref 0–40)
Albumin/Globulin Ratio: 2.3 — ABNORMAL HIGH (ref 1.2–2.2)
Albumin: 4.9 g/dL (ref 4.0–5.0)
Alkaline Phosphatase: 74 IU/L (ref 39–117)
BUN/Creatinine Ratio: 23 — ABNORMAL HIGH (ref 9–20)
BUN: 32 mg/dL — ABNORMAL HIGH (ref 6–20)
Bilirubin Total: 0.8 mg/dL (ref 0.0–1.2)
CO2: 20 mmol/L (ref 20–29)
Calcium: 9.8 mg/dL (ref 8.7–10.2)
Chloride: 106 mmol/L (ref 96–106)
Creatinine, Ser: 1.41 mg/dL — ABNORMAL HIGH (ref 0.76–1.27)
GFR calc Af Amer: 74 mL/min/{1.73_m2} (ref >59)
GFR calc non Af Amer: 64 mL/min/{1.73_m2} (ref >59)
Globulin, Total: 2.1 g/dL (ref 1.5–4.5)
Glucose: 80 mg/dL (ref 65–99)
Potassium: 4.4 mmol/L (ref 3.5–5.2)
Sodium: 144 mmol/L (ref 134–144)
Total Protein: 7 g/dL (ref 6.0–8.5)

## 2018-10-13 LAB — TOPIRAMATE LEVEL: Topiramate Lvl: 7.8 ug/mL (ref 2.0–25.0)

## 2018-10-16 ENCOUNTER — Telehealth: Payer: Self-pay | Admitting: Neurology

## 2018-10-16 MED ORDER — TOPIRAMATE 100 MG PO TABS: ORAL_TABLET | ORAL | 6 refills | Status: DC

## 2018-10-16 NOTE — Telephone Encounter (Signed)
I called the patient.  His lab work was unremarkable, Topamax level was within normal limits.  A refill for his Topamax 100 mg in the morning, 200 mg at bedtime was added to his pharmacy.  He will follow-up in 6 months for revisit.  I advised him to ensure adherence to taking his medications, do not miss any doses.  He should let us know of any recurrent seizure events.  His creatinine was mildly elevated at 1.41, was the same at last visit. He verbalized understanding.

## 2018-11-21 ENCOUNTER — Telehealth: Payer: Self-pay

## 2018-11-21 NOTE — Telephone Encounter (Signed)
Elijah Roch, NP recommended a follow up in September. However, it appears that pt has an outstanding balance with [place], confirmed today by our billing department.  I have asked our billing dept to reach out to the pt to discuss and let us know when he is ready to be scheduled.

## 2019-06-08 ENCOUNTER — Other Ambulatory Visit: Payer: Self-pay | Admitting: Neurology

## 2019-06-08 ENCOUNTER — Other Ambulatory Visit: Payer: Self-pay | Admitting: Family Medicine

## 2019-07-31 ENCOUNTER — Other Ambulatory Visit: Payer: Self-pay

## 2019-07-31 MED ORDER — TOPIRAMATE 100 MG PO TABS: ORAL_TABLET | ORAL | 3 refills | Status: DC

## 2019-07-31 NOTE — Telephone Encounter (Signed)
patient has FU in April. Refilled topamax until FU with note to pharmacy: he must keep FU to get further refills.

## 2019-07-31 NOTE — Telephone Encounter (Signed)
1) Medication(s) Requested (by name): topiramate (TOPAMAX) 100 MG tablet   2) Pharmacy of Choice: [facility] [place] [facility]  [address] [place] [address]

## 2019-09-27 ENCOUNTER — Ambulatory Visit: Payer: BC Managed Care – PPO | Attending: Internal Medicine

## 2019-09-27 DIAGNOSIS — Z23 Encounter for immunization: Secondary | ICD-10-CM

## 2019-09-27 NOTE — Progress Notes (Signed)
   Covid-19 Vaccination Clinic  Name:  Elijah Huang    MRN: [id number] DOB: [birth year]  09/27/2019  Elijah Huang was observed post Covid-19 immunization for 15 minutes without incident. He was provided with Vaccine Information Sheet and instruction to access the V-Safe system.   Elijah Huang was instructed to call 911 with any severe reactions post vaccine: Marland Kitchen Difficulty breathing  . Swelling of face and throat  . A fast heartbeat  . A bad rash all over body  . Dizziness and weakness   Immunizations Administered    Name Date Dose VIS Date Route   Pfizer COVID-19 Vaccine 09/27/2019  3:28 PM 0.3 mL 06/22/2019 Intramuscular   Manufacturer: [place]   Lot: [id number]   [place]: [id number]

## 2019-10-22 ENCOUNTER — Ambulatory Visit: Payer: BC Managed Care – PPO | Attending: Internal Medicine

## 2019-10-22 DIAGNOSIS — Z23 Encounter for immunization: Secondary | ICD-10-CM

## 2019-10-22 NOTE — Progress Notes (Signed)
   Covid-19 Vaccination Clinic  Name:  Elijah Huang    MRN: [id number] DOB: [birth year]  10/22/2019  Elijah Huang was observed post Covid-19 immunization for 15 minutes without incident. He was provided with Vaccine Information Sheet and instruction to access the V-Safe system.   Elijah Huang was instructed to call 911 with any severe reactions post vaccine: Marland Kitchen Difficulty breathing  . Swelling of face and throat  . A fast heartbeat  . A bad rash all over body  . Dizziness and weakness   Immunizations Administered    Name Date Dose VIS Date Route   Pfizer COVID-19 Vaccine 10/22/2019  3:27 PM 0.3 mL 06/22/2019 Intramuscular   Manufacturer: [place]   Lot: [id number]   [place]: [id number]

## 2019-11-08 ENCOUNTER — Encounter: Payer: Self-pay | Admitting: Neurology

## 2019-11-08 ENCOUNTER — Ambulatory Visit: Payer: BC Managed Care – PPO | Admitting: Neurology

## 2019-11-08 ENCOUNTER — Other Ambulatory Visit: Payer: Self-pay

## 2019-11-08 VITALS — BP 123/83 | HR 97 | Temp 97.6°F | Ht 72.0 in | Wt 185.0 lb

## 2019-11-08 DIAGNOSIS — G40909 Epilepsy, unspecified, not intractable, without status epilepticus: Secondary | ICD-10-CM | POA: Diagnosis not present

## 2019-11-08 DIAGNOSIS — G43009 Migraine without aura, not intractable, without status migrainosus: Secondary | ICD-10-CM

## 2019-11-08 NOTE — Patient Instructions (Signed)
It was nice to see you today Continue on Topamax for now Get your physical, discuss fatigue  See you in 6 months or sooner if needed

## 2019-11-08 NOTE — Progress Notes (Signed)
PATIENT: Elijah Huang DOB: 1980/12/05  REASON FOR VISIT: follow up HISTORY FROM: patient  HISTORY OF PRESENT ILLNESS: Today 11/08/19  HISTORY HISTORY Elijah Huang is a 39 years old right-handed Caucasian male, accompanied by his girlfriend, referred by his primary care physician Dr. Danise Mina for evaluation of possible seizure, with his girl friend AprilHe had past medical history of GERD, erectile dysfunction He presented with seizure-like event, the initial episode was witness by his girlfriend, Dec 07 2013, after waking up talking with his girlfriend for a while, shortly afterwards, he was noticed to lie on his left side, body shaking, loss of consciousness, lasting for a few minutes, with tongue biting, lip numbness, next day, in May 30th 2015, while taking a shower, his girlfriend heard a loud thundering, he was sitting in the shower, confused, did not know what has happened, no self injury, He denied previous history of seizure, he presented to the emergency room Dec 09 2013, with constellation of complaints, intermittent chest pain, blurry vision, left eye pain, lightheadedness, He had a CAT scan of the brain that was normal, laboratory showed normal CBC, CMP, negative troponin, 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 Sept 24th 2015: At work, he became unresponsive to his manager, staring, hand  tremor, mild urinary incontinence He has no collection of hte events, no warning signs,  He noticed side effects on Depakote this includes weight gain, increased bilateral hands tremor , Depakote was stop in Nov 2015, started Keppra  UPDATE October 30 2014:He is doing well with keppra 1500mg  bid , Topamax 100mg  bid, no recurrent seizure, rarely has headache, Maxalt was helpful.  UPDATE 04/30/2015 CMMr. Chap, 39 year old male returns for follow-up he has been seen in the past by Dr. Krista Blue in our clinic . He has a history of generalized seizure disorder and migraine headaches. He is currently on Keppra 1500 mg twice daily and Topamax 100mg  twice daily. Last seizure was 05/22/2014. He continues to have an occasional headache which is usually relieved with Maxalt. He needs refills on Maxalt. He is not aware of any food triggers. He returns for reevaluation  UPDATE 07/24/2015. CMMr. Albrecht, 39 year old male returns for follow-up. He has a history of seizure disorder and migraines. His migraines are in excellent control on Topamax 100 twice daily and Maxalt acutely. His last seizure event occurred in November 2015. He was last seen in the office 10/6/ 2016. At that time he was doing well on Keppra however he called in to the office several weeks ago with increased irritability and tiredness on the drug. He returns today to talk about changing his seizure medications. He has previously been on Depakote with continued seizures. His previous seizure episodes are described as his body gets stiff with jerking of the extremities and loss of consciousness. These have been witnessed by his girlfriend in the past. He returns for reevaluation UPDATE 04/19/2017CMMr.  [place], 39 year old male returns for follow-up he was last seen in the office 07/24/2015. He has history of migraines that are in excellent control with Topamax also has a history of seizure disorder some increased irritability and tiredness on Keppra reported at last  visit. He was successfully switched to Vimpat and has tapered off of Keppra without difficulty. He claims he feels much better. He returns for reevaluation no further seizure activity. UPDATE 10/31/2017CMMr. Macinnes, 39 year old male returns for follow-up. He has a history of seizure disorder with last seizure event occurring in November 2015. He is currently on Vimpat. He has history of migraines and is on Topamax 100 mg twice daily Maxalt acutely. His headaches are in excellent control and he rarely uses Maxalt. He just got tired by the [facility] Department. He returns for reevaluation  UPDATE11/19/2018CMMr. Dekay, 39 year old male returns for follow-up with a history of seizure disorder. Last seizure occurred in 2015. In addition he has history of migraines. He was on Vimpat and Topamax when last seen however his insurance changed and he could not afford the Vimpat so his Topamax was increased. He is currently taking 300 mg total dose daily. He is now working third shift,and complains of inability to go to sleep. He is getting about 3-4 hours of sleep daily and his headaches and have increased since he has been on shift work. His primary care has given him some information on good sleep habits and hygiene. He was made aware that lack of sleep is not only a migraine trigger but a seizure trigger as well. Maxalt works acutely. He returns for reevaluation  Update October 11 2018 SS: Elijah Huang 39 year old male, history of seizures and migraines, taking Topamax 100 mg, 1 in the morning, 2 at bedtime.  He reports he may have had one episode of seizure last summer, but he is not sure it was a seizure.  He reports he was in the bed had just woken up, was drowsy, remembers feeling like he could not new, lasting for a few seconds.  He reports it subsided, he felt drowsy but was able to move.  He denies any shaking or incontinence, oral trauma.  He reports in the past this is been similar to his previous seizure  events.  He does report that he may have missed some of his Topamax dosage during that time.  He reports his initial seizure was in 2015, his most notable recent seizure was in 2017.  Reports his headaches have been under good control, has not been having to take Maxalt for several months.  He is currently working day shift, full-time, driving a car.  In the past he has been unable to tolerate Depakote or Keppra.  Previously he was on Topamax and Vimpat for seizures.  He stopped his Vimpat during a period of time when he lost his insurance and the medication was too expensive.  He denies any new problems or concerns.  Update November 08, 2019 SS: Here today for follow-up unaccompanied, he remains on Topamax 100 mg in the morning, 200 mg at bedtime.  He was previously on Topamax 100 mg twice a day, along with Vimpat 50 mg twice a day, but he lost his insurance, at that time he came off Vimpat, and Topamax was increased.  He has not had recurrent seizure, there was some question of a possible seizure in summer 2019, possibly missing some dosages of Topamax.  His seizures are usually nocturnal, he is now living alone, no  seizure that he knows of.  Historically, he knows he had seizure, he wakes up the next morning feeling very drowsy, feeling "different".  He works day shift at [employer].  Indicates his headaches have been under good control, he no longer takes Maxalt.  He is due for physical.  He does report general fatigue, he feels is related to the Topamax.  He denies significant snoring, after work, he may doze on the couch, go to bed early.  He equates this with occurring since he went on a higher dose of Topamax.  REVIEW OF SYSTEMS: Out of a complete 14 system review of symptoms, the patient complains only of the following symptoms, and all other reviewed systems are negative.  Seizure   ALLERGIES: Allergies  Allergen Reactions   Keppra [Levetiracetam] Other (See Comments)    irritability     HOME MEDICATIONS: Outpatient Medications Prior to Visit  Medication Sig Dispense Refill   cetirizine (ZYRTEC) 10 MG tablet Take 10 mg by mouth daily as needed.      Cholecalciferol (VITAMIN D3) 1000 units CAPS Take 1 capsule (1,000 Units total) by mouth daily. 30 capsule    cyanocobalamin (V-R VITAMIN B-12) 500 MCG tablet Take 1 tablet (500 mcg total) by mouth every Monday, Wednesday, and Friday.     omeprazole (PRILOSEC) 40 MG capsule TAKE 1 CAPSULE BY MOUTH EVERY MONDAY, WEDNESDAY AND FRIDAY 30 capsule 10   topiramate (TOPAMAX) 100 MG tablet TAKE ONE TAB BY MOUTH EVERY MORNING AND TWO TABS EVERY EVENING. 90 tablet 3   amoxicillin (AMOXIL) 875 MG tablet Take 1 tablet (875 mg total) by mouth 2 (two) times daily. (Patient not taking: Reported on 11/08/2019) 14 tablet 0   No facility-administered medications prior to visit.    PAST MEDICAL HISTORY: Past Medical History:  Diagnosis Date   ED (erectile dysfunction) of non-organic origin 2014   thought psychogenic by urology [name])   GERD (gastroesophageal reflux disease)    History of chicken pox    Migraines    Seizure disorder ([place]) 2015   presumed partial seizures, started on depakote, rec no driving for 6 mo ([id number], Dr Addison Lank)    PAST SURGICAL HISTORY: Past Surgical History:  Procedure Laterality Date   TONSILLECTOMY  1989    FAMILY HISTORY: Family History  Problem Relation Age of Onset   Diabetes Father    Coronary artery disease Father 77       cabg 26, nonsmoker, obese   Cancer Father 83       prostate   Coronary artery disease Paternal Grandfather 33       deceased from same   Cancer Maternal Grandfather        pancreatic   Stroke Neg Hx     SOCIAL HISTORY: Social History   Socioeconomic History   Marital status: Divorced    Spouse name: Not on file   Number of children: 0   Years of education: 12   Highest education level: Not on file  Occupational History   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   Social Determinants of Health   Financial Resource Strain:    Difficulty of Paying Living Expenses:   Food Insecurity:    Worried About Charity fundraiser in the Last Year:    Arboriculturist in the Last Year:   Transportation Needs:    Film/video editor (Medical):    Lack of Transportation (Non-Medical):   Physical Activity:    Days of Exercise per Week:    Minutes of Exercise per Session:   Stress:    Feeling of Stress :   Social Connections:    Frequency of Communication with Friends and Family:    Frequency of Social Gatherings with Friends and Family:    Attends Religious Services:    Active Member of Clubs or Organizations:    Attends Archivist Meetings:    Marital Status:   Intimate Partner Violence:    Fear of Current or Ex-Partner:    Emotionally Abused:    Physically Abused:    Sexually Abused:    PHYSICAL EXAM  Vitals:   11/08/19 1525  BP: 123/83  Pulse: 97  Temp: 97.6 F (36.4 C)  Weight: 185 lb (83.9 kg)  Height: 6' (1.829 m)   Body mass index is 25.09 kg/m.  Generalized: Well developed, in no acute distress   Neurological examination  Mentation: Alert oriented to time, place, history taking. Follows all commands speech and language fluent Cranial nerve II-XII: Pupils were equal round  reactive to light. Extraocular movements were full, visual field were full on confrontational test. Facial sensation and strength were normal. Head turning and shoulder shrug  were normal and symmetric. Motor: The motor testing reveals 5 over 5 strength of all 4 extremities. Good symmetric motor tone is noted throughout.  Sensory: Sensory testing is intact to soft touch on all 4 extremities. No evidence of extinction is noted.  Coordination: Cerebellar testing reveals good finger-nose-finger and heel-to-shin bilaterally.  Gait and station: Gait is normal.  Reflexes: Deep tendon reflexes are symmetric and normal bilaterally.   DIAGNOSTIC DATA (LABS, IMAGING, TESTING) - I reviewed patient records, labs, notes, testing and imaging myself where available.  Lab Results  Component Value Date   WBC 6.9 10/12/2018   HGB 16.1 10/12/2018   HCT 45.8 10/12/2018   MCV 83 10/12/2018   PLT 329 10/12/2018      Component Value Date/Time   NA 144 10/12/2018 1529   K 4.4 10/12/2018 1529   CL 106 10/12/2018 1529   CO2 20 10/12/2018 1529   GLUCOSE 80 10/12/2018 1529   GLUCOSE 85 05/31/2018 1535   BUN 32 (H) 10/12/2018 1529   CREATININE 1.41 (H) 10/12/2018 1529   CREATININE 1.18 11/21/2015 1613   CALCIUM 9.8 10/12/2018 1529   PROT 7.0 10/12/2018 1529   ALBUMIN 4.9 10/12/2018 1529   AST 16 10/12/2018 1529   ALT 21 10/12/2018 1529   ALKPHOS 74 10/12/2018 1529   BILITOT 0.8 10/12/2018 1529   GFRNONAA 64 10/12/2018 1529   GFRAA 74 10/12/2018 1529   Lab Results  Component Value Date   CHOL 166 05/31/2018   HDL 68.30 05/31/2018   LDLCALC 73 05/31/2018   TRIG 120.0 05/31/2018   CHOLHDL 2 05/31/2018  No results found for: HGBA1C Lab Results  Component Value Date   VITAMINB12 292 05/31/2018   Lab Results  Component Value Date   TSH 1.76 11/21/2015    ASSESSMENT AND PLAN 39 y.o. year old male  has a past medical history of ED (erectile dysfunction) of non-organic origin (2014), GERD  (gastroesophageal reflux disease), History of chicken pox, Migraines, and Seizure disorder ([place]) (2015). here with:  1.  Seizures 2.  Migraines -Most recent was in Nov 2015, possibly in summer 2019 (missed doses) -He has previously been unable to tolerate Keppra, or Depakote, Vimpat due to losing his health insurance -He reports possible side effect of fatigue on Topamax, we discussed, get a physical, have routine labs checked (possibly TSH, vitamin D, B12), if no abnormality, may consider seizure medication adjustment -We could consider Topamax dose reduction, along with reinitiating Vimpat, but we need to consider cost of Vimpat, could also consider complete switch to Lamictal for seizure control, migraines are currently well controlled on Topamax, Lamictal may help as well -He will follow-up in 1 year or sooner if needed, will contact me about making medication switch  I spent 30 minutes of face-to-face and non-face-to-face time with patient.  This included previsit chart review, lab review, study review, order entry, electronic health record documentation, patient education.  Butler Denmark, AGNP-C, DNP 11/08/2019, 4:00 PM [facility] [address] [place], [place] [address] [phone]

## 2019-11-14 ENCOUNTER — Encounter: Payer: Self-pay | Admitting: Family Medicine

## 2019-11-14 ENCOUNTER — Other Ambulatory Visit: Payer: Self-pay

## 2019-11-14 ENCOUNTER — Ambulatory Visit (INDEPENDENT_AMBULATORY_CARE_PROVIDER_SITE_OTHER): Payer: BC Managed Care – PPO | Admitting: Family Medicine

## 2019-11-14 VITALS — BP 116/72 | HR 99 | Temp 97.9°F | Ht 71.0 in | Wt 180.1 lb

## 2019-11-14 DIAGNOSIS — E559 Vitamin D deficiency, unspecified: Secondary | ICD-10-CM

## 2019-11-14 DIAGNOSIS — E538 Deficiency of other specified B group vitamins: Secondary | ICD-10-CM

## 2019-11-14 DIAGNOSIS — G40909 Epilepsy, unspecified, not intractable, without status epilepticus: Secondary | ICD-10-CM | POA: Diagnosis not present

## 2019-11-14 DIAGNOSIS — K219 Gastro-esophageal reflux disease without esophagitis: Secondary | ICD-10-CM | POA: Diagnosis not present

## 2019-11-14 DIAGNOSIS — Z0000 Encounter for general adult medical examination without abnormal findings: Secondary | ICD-10-CM

## 2019-11-14 DIAGNOSIS — G43009 Migraine without aura, not intractable, without status migrainosus: Secondary | ICD-10-CM

## 2019-11-14 MED ORDER — OMEPRAZOLE 40 MG PO CPDR: 40.0000 mg | DELAYED_RELEASE_CAPSULE | ORAL | 3 refills | Status: DC

## 2019-11-14 NOTE — Assessment & Plan Note (Signed)
Chronic, managed with topamax.

## 2019-11-14 NOTE — Assessment & Plan Note (Signed)
Preventative protocols reviewed and updated unless pt declined. Discussed healthy diet and lifestyle.  

## 2019-11-14 NOTE — Assessment & Plan Note (Signed)
Chronic, stable on PPI MWF. Refilled today.

## 2019-11-14 NOTE — Assessment & Plan Note (Addendum)
Seizure free (last 2015). Continues topamax 100/200 daily. Some fatigue which may be related. Will check for other causes with labwork today.

## 2019-11-14 NOTE — Patient Instructions (Signed)
Labs today You are doing well today Return as needed or in 1 year for next physical.   Health Maintenance, Male Adopting a healthy lifestyle and getting preventive care are important in promoting health and wellness. Ask your health care provider about:  The right schedule for you to have regular tests and exams.  Things you can do on your own to prevent diseases and keep yourself healthy. What should I know about diet, weight, and exercise? Eat a healthy diet   Eat a diet that includes plenty of vegetables, fruits, low-fat dairy products, and lean protein.  Do not eat a lot of foods that are high in solid fats, added sugars, or sodium. Maintain a healthy weight Body mass index (BMI) is a measurement that can be used to identify possible weight problems. It estimates body fat based on height and weight. Your health care provider can help determine your BMI and help you achieve or maintain a healthy weight. Get regular exercise Get regular exercise. This is one of the most important things you can do for your health. Most adults should:  Exercise for at least 150 minutes each week. The exercise should increase your heart rate and make you sweat (moderate-intensity exercise).  Do strengthening exercises at least twice a week. This is in addition to the moderate-intensity exercise.  Spend less time sitting. Even light physical activity can be beneficial. Watch cholesterol and blood lipids Have your blood tested for lipids and cholesterol at 37 years of age, then have this test every 5 years. You may need to have your cholesterol levels checked more often if:  Your lipid or cholesterol levels are high.  You are older than 37 years of age.  You are at high risk for heart disease. What should I know about cancer screening? Many types of cancers can be detected early and may often be prevented. Depending on your health history and family history, you may need to have cancer screening at  various ages. This may include screening for:  Colorectal cancer.  Prostate cancer.  Skin cancer.  Lung cancer. What should I know about heart disease, diabetes, and high blood pressure? Blood pressure and heart disease  High blood pressure causes heart disease and increases the risk of stroke. This is more likely to develop in people who have high blood pressure readings, are of African descent, or are overweight.  Talk with your health care provider about your target blood pressure readings.  Have your blood pressure checked: ? Every 3-5 years if you are 55-85 years of age. ? Every year if you are 56 years old or older.  If you are between the ages of 58 and 39 and are a current or former smoker, ask your health care provider if you should have a one-time screening for abdominal aortic aneurysm (AAA). Diabetes Have regular diabetes screenings. This checks your fasting blood sugar level. Have the screening done:  Once every three years after age 38 if you are at a normal weight and have a low risk for diabetes.  More often and at a younger age if you are overweight or have a high risk for diabetes. What should I know about preventing infection? Hepatitis B If you have a higher risk for hepatitis B, you should be screened for this virus. Talk with your health care provider to find out if you are at risk for hepatitis B infection. Hepatitis C Blood testing is recommended for:  Everyone born from [birth year] through 1963.  Anyone with known risk factors for hepatitis C. Sexually transmitted infections (STIs)  You should be screened each year for STIs, including gonorrhea and chlamydia, if: ? You are sexually active and are younger than 37 years of age. ? You are older than 37 years of age and your health care provider tells you that you are at risk for this type of infection. ? Your sexual activity has changed since you were last screened, and you are at increased risk for chlamydia  or gonorrhea. Ask your health care provider if you are at risk.  Ask your health care provider about whether you are at high risk for HIV. Your health care provider may recommend a prescription medicine to help prevent HIV infection. If you choose to take medicine to prevent HIV, you should first get tested for HIV. You should then be tested every 3 months for as long as you are taking the medicine. Follow these instructions at home: Lifestyle  Do not use any products that contain nicotine or tobacco, such as cigarettes, e-cigarettes, and chewing tobacco. If you need help quitting, ask your health care provider.  Do not use street drugs.  Do not share needles.  Ask your health care provider for help if you need support or information about quitting drugs. Alcohol use  Do not drink alcohol if your health care provider tells you not to drink.  If you drink alcohol: ? Limit how much you have to 0-2 drinks a day. ? Be aware of how much alcohol is in your drink. In the [place] one drink equals one 12 oz bottle of beer (355 mL), one 5 oz glass of wine (148 mL), or one 1 oz glass of hard liquor (44 mL). General instructions  Schedule regular health, dental, and eye exams.  Stay current with your vaccines.  Tell your health care provider if: ? You often feel depressed. ? You have ever been abused or do not feel safe at home. Summary  Adopting a healthy lifestyle and getting preventive care are important in promoting health and wellness.  Follow your health care provider's instructions about healthy diet, exercising, and getting tested or screened for diseases.  Follow your health care provider's instructions on monitoring your cholesterol and blood pressure. This information is not intended to replace advice given to you by your health care provider. Make sure you discuss any questions you have with your health care provider. Document Revised: 06/21/2018 Document Reviewed:  06/21/2018 Elsevier Patient Education  2020 [employer].

## 2019-11-14 NOTE — Assessment & Plan Note (Signed)
Update level on 1000 iu daily.

## 2019-11-14 NOTE — Progress Notes (Signed)
This visit was conducted in person.  BP 116/72 (BP Location: Left Arm, Patient Position: Sitting, Cuff Size: Normal)   Pulse 99   Temp 97.9 F (36.6 C) (Temporal)   Ht 5\' 11"  (1.803 m)   Wt 180 lb 1 oz (81.7 kg)   SpO2 97%   BMI 25.11 kg/m    CC: CPE Subjective:    Patient ID: [name], male    DOB: [birth year], 39 y.o.   MRN: [id number]  HPI: WAYDE NYBO is a 39 y.o. male presenting on 11/14/2019 for Annual Exam   Followed by neurology for seizures and migraines. Continues mod-high dose topamax. Remains seizure free. Notes fatigue ?AED related. Would like labs checked today.   GERD - controlled on MWF PPI.   Preventative: Flu shot yearly Tdap 2012 COVID vaccine - completed [place] last month 10/2019  Seat belt use discussed Sunscreen use discussed. No changing moles on skin.  Non smoker Alcohol - occasional on weekends. Dentist Q6 mo Eye exam yearly  Caffeine: 1-2 soft drinks/day  Divorced.  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 Interim medical history since our last visit reviewed. Allergies and medications reviewed and updated. Outpatient Medications Prior to Visit  Medication Sig Dispense Refill  . cetirizine (ZYRTEC) 10 MG tablet Take 10 mg by mouth daily as needed.     . Cholecalciferol (VITAMIN D3) 1000 units CAPS Take 1 capsule (1,000 Units total) by mouth daily. 30 capsule   . cyanocobalamin (V-R VITAMIN B-12) 500 MCG tablet Take 1 tablet (500 mcg total) by mouth every Monday, Wednesday, and Friday.    . topiramate (TOPAMAX) 100 MG tablet TAKE ONE TAB BY MOUTH EVERY MORNING AND TWO TABS EVERY EVENING. 90 tablet 3  . omeprazole (PRILOSEC) 40 MG capsule TAKE 1 CAPSULE BY MOUTH EVERY MONDAY, WEDNESDAY AND FRIDAY 30  capsule 10   No facility-administered medications prior to visit.     Per HPI unless specifically indicated in ROS section below Review of Systems  Constitutional: Negative for activity change, appetite change, chills, fatigue, fever and unexpected weight change.  HENT: Negative for hearing loss.   Eyes: Negative for visual disturbance.  Respiratory: Negative for cough, chest tightness, shortness of breath and wheezing.   Cardiovascular: Negative for chest pain, palpitations and leg swelling.  Gastrointestinal: Negative for abdominal distention, abdominal pain, blood in stool, constipation, diarrhea, nausea and vomiting.  Genitourinary: Negative for difficulty urinating and hematuria.  Musculoskeletal: Negative for arthralgias, myalgias and neck pain.  Skin: Negative for rash.  Neurological: Negative for dizziness, seizures, syncope and headaches.  Hematological: Negative for adenopathy. Does not bruise/bleed easily.  Psychiatric/Behavioral: Negative for dysphoric mood. The patient is not nervous/anxious.    Objective:  BP 116/72 (BP Location: Left Arm, Patient Position: Sitting, Cuff Size: Normal)   Pulse 99   Temp 97.9 F (36.6 C) (Temporal)   Ht 5\' 11"  (1.803 m)   Wt 180 lb 1 oz (81.7 kg)   SpO2 97%   BMI 25.11 kg/m   Wt Readings from Last 3 Encounters:  11/14/19 180 lb 1 oz (81.7 kg)  11/08/19 185 lb (83.9 kg)  08/28/18 186 lb 12.8 oz (84.7 kg)      Physical Exam Vitals and nursing note reviewed.  Constitutional:  General: He is not in acute distress.    Appearance: Normal appearance. He is well-developed. He is not ill-appearing.  HENT:     Right Ear: Hearing, tympanic membrane, ear canal and external ear normal.     Left Ear: Hearing, tympanic membrane, ear canal and external ear normal.  Eyes:     General: No scleral icterus.    Extraocular Movements: Extraocular movements intact.     Conjunctiva/sclera: Conjunctivae normal.     Pupils: Pupils are equal,  round, and reactive to light.  Cardiovascular:     Rate and Rhythm: Normal rate and regular rhythm.     Pulses: Normal pulses.          Radial pulses are 2+ on the right side and 2+ on the left side.     Heart sounds: Normal heart sounds. No murmur.  Pulmonary:     Effort: Pulmonary effort is normal. No respiratory distress.     Breath sounds: Normal breath sounds. No wheezing, rhonchi or rales.  Abdominal:     General: Abdomen is flat. Bowel sounds are normal. There is no distension.     Palpations: Abdomen is soft. There is no mass.     Tenderness: There is no abdominal tenderness. There is no guarding or rebound.     Hernia: No hernia is present.  Musculoskeletal:        General: Normal range of motion.     Cervical back: Normal range of motion and neck supple.     Right lower leg: No edema.     Left lower leg: No edema.  Lymphadenopathy:     Cervical: No cervical adenopathy.  Skin:    General: Skin is warm and dry.     Findings: No rash.  Neurological:     General: No focal deficit present.     Mental Status: He is alert and oriented to person, place, and time.     Comments: CN grossly intact, station and gait intact  Psychiatric:        Mood and Affect: Mood normal.        Behavior: Behavior normal.        Thought Content: Thought content normal.        Judgment: Judgment normal.       Results for orders placed or performed in visit on 10/12/18  Topiramate Level  Result Value Ref Range   Topiramate Lvl 7.8 2.0 - 25.0 ug/mL  CMP  Result Value Ref Range   Glucose 80 65 - 99 mg/dL   BUN 32 (H) 6 - 20 mg/dL   Creatinine, Ser 1.41 (H) 0.76 - 1.27 mg/dL   GFR calc non Af Amer 64 >59 mL/min/1.73   GFR calc Af Amer 74 >59 mL/min/1.73   BUN/Creatinine Ratio 23 (H) 9 - 20   Sodium 144 134 - 144 mmol/L   Potassium 4.4 3.5 - 5.2 mmol/L   Chloride 106 96 - 106 mmol/L   CO2 20 20 - 29 mmol/L   Calcium 9.8 8.7 - 10.2 mg/dL   Total Protein 7.0 6.0 - 8.5 g/dL   Albumin 4.9  4.0 - 5.0 g/dL   Globulin, Total 2.1 1.5 - 4.5 g/dL   Albumin/Globulin Ratio 2.3 (H) 1.2 - 2.2   Bilirubin Total 0.8 0.0 - 1.2 mg/dL   Alkaline Phosphatase 74 39 - 117 IU/L   AST 16 0 - 40 IU/L   ALT 21 0 - 44 IU/L  CBC with Differential/Platelets  Result Value Ref  Range   WBC 6.9 3.4 - 10.8 x10E3/uL   RBC 5.54 4.14 - 5.80 x10E6/uL   Hemoglobin 16.1 13.0 - 17.7 g/dL   Hematocrit 45.8 37.5 - 51.0 %   MCV 83 79 - 97 fL   MCH 29.1 26.6 - 33.0 pg   MCHC 35.2 31.5 - 35.7 g/dL   RDW 13.1 11.6 - 15.4 %   Platelets 329 150 - 450 x10E3/uL   Neutrophils 55 Not Estab. %   Lymphs 34 Not Estab. %   Monocytes 8 Not Estab. %   Eos 2 Not Estab. %   Basos 1 Not Estab. %   Neutrophils Absolute 3.8 1.4 - 7.0 x10E3/uL   Lymphocytes Absolute 2.4 0.7 - 3.1 x10E3/uL   Monocytes Absolute 0.5 0.1 - 0.9 x10E3/uL   EOS (ABSOLUTE) 0.2 0.0 - 0.4 x10E3/uL   Basophils Absolute 0.0 0.0 - 0.2 x10E3/uL   Immature Granulocytes 0 Not Estab. %   Immature Grans (Abs) 0.0 0.0 - 0.1 x10E3/uL   Assessment & Plan:  This visit occurred during the SARS-CoV-2 public health emergency.  Safety protocols were in place, including screening questions prior to the visit, additional usage of staff PPE, and extensive cleaning of exam room while observing appropriate contact time as indicated for disinfecting solutions.   Problem List Items Addressed This Visit    Vitamin D deficiency    Update level on 1000 iu daily.       Relevant Orders   VITAMIN D 25 Hydroxy (Vit-D Deficiency, Fractures)   Seizure disorder ([place])    Seizure free (last 2015). Continues topamax 100/200 daily. Some fatigue which may be related. Will check for other causes with labwork today.       Relevant Orders   TSH   Comprehensive metabolic panel   CBC with Differential/Platelet   Microalbumin / creatinine urine ratio   Migraine without aura and without status migrainosus, not intractable    Chronic, managed with topamax.      Low vitamin B12  level    Update level on 562mcg MWF      Relevant Orders   Vitamin B12   Healthcare maintenance - Primary    Preventative protocols reviewed and updated unless pt declined. Discussed healthy diet and lifestyle.       GERD (gastroesophageal reflux disease)    Chronic, stable on PPI MWF. Refilled today.       Relevant Medications   omeprazole (PRILOSEC) 40 MG capsule       Meds ordered this encounter  Medications  . omeprazole (PRILOSEC) 40 MG capsule    Sig: Take 1 capsule (40 mg total) by mouth every Monday, Wednesday, and Friday.    Dispense:  40 capsule    Refill:  3   Orders Placed This Encounter  Procedures  . Vitamin B12  . VITAMIN D 25 Hydroxy (Vit-D Deficiency, Fractures)  . TSH  . Comprehensive metabolic panel  . CBC with Differential/Platelet  . Microalbumin / creatinine urine ratio    Patient instructions: Labs today You are doing well today Return as needed or in 1 year for next physical.   Follow up plan: Return in about 1 year (around 11/13/2020) for annual exam, prior fasting for blood work.  Ria Bush, MD

## 2019-11-14 NOTE — Assessment & Plan Note (Signed)
Update level on 582mcg MWF

## 2019-11-15 LAB — CBC WITH DIFFERENTIAL/PLATELET
Basophils Absolute: 0.1 10*3/uL (ref 0.0–0.1)
Basophils Relative: 1 % (ref 0.0–3.0)
Eosinophils Absolute: 0.1 10*3/uL (ref 0.0–0.7)
Eosinophils Relative: 2 % (ref 0.0–5.0)
HCT: 45.1 % (ref 39.0–52.0)
Hemoglobin: 15.5 g/dL (ref 13.0–17.0)
Lymphocytes Relative: 33 % (ref 12.0–46.0)
Lymphs Abs: 2.1 10*3/uL (ref 0.7–4.0)
MCHC: 34.3 g/dL (ref 30.0–36.0)
MCV: 84.3 fl (ref 78.0–100.0)
Monocytes Absolute: 0.5 10*3/uL (ref 0.1–1.0)
Monocytes Relative: 8 % (ref 3.0–12.0)
Neutro Abs: 3.6 10*3/uL (ref 1.4–7.7)
Neutrophils Relative %: 56 % (ref 43.0–77.0)
Platelets: 329 10*3/uL (ref 150.0–400.0)
RBC: 5.35 Mil/uL (ref 4.22–5.81)
RDW: 13.3 % (ref 11.5–15.5)
WBC: 6.5 10*3/uL (ref 4.0–10.5)

## 2019-11-15 LAB — COMPREHENSIVE METABOLIC PANEL
ALT: 17 U/L (ref 0–53)
AST: 18 U/L (ref 0–37)
Albumin: 4.9 g/dL (ref 3.5–5.2)
Alkaline Phosphatase: 64 U/L (ref 39–117)
BUN: 28 mg/dL — ABNORMAL HIGH (ref 6–23)
CO2: 23 mEq/L (ref 19–32)
Calcium: 9.7 mg/dL (ref 8.4–10.5)
Chloride: 108 mEq/L (ref 96–112)
Creatinine, Ser: 1.38 mg/dL (ref 0.40–1.50)
GFR: 58.03 mL/min — ABNORMAL LOW (ref >60.00)
Glucose, Bld: 89 mg/dL (ref 70–99)
Potassium: 4.1 mEq/L (ref 3.5–5.1)
Sodium: 139 mEq/L (ref 135–145)
Total Bilirubin: 1 mg/dL (ref 0.2–1.2)
Total Protein: 7.4 g/dL (ref 6.0–8.3)

## 2019-11-15 LAB — VITAMIN B12: Vitamin B-12: 592 pg/mL (ref 211–911)

## 2019-11-15 LAB — MICROALBUMIN / CREATININE URINE RATIO
Creatinine,U: 173.2 mg/dL
Microalb Creat Ratio: 0.4 mg/g (ref 0.0–30.0)
Microalb, Ur: <0.7 mg/dL (ref 0.0–1.9)

## 2019-11-15 LAB — VITAMIN D 25 HYDROXY (VIT D DEFICIENCY, FRACTURES): VITD: 42 ng/mL (ref 30.00–100.00)

## 2019-11-15 LAB — TSH: TSH: 1.97 u[IU]/mL (ref 0.35–4.50)

## 2019-11-20 NOTE — Progress Notes (Signed)
I have reviewed and agreed above plan. 

## 2020-01-03 ENCOUNTER — Telehealth: Payer: Self-pay | Admitting: *Deleted

## 2020-01-03 NOTE — Telephone Encounter (Signed)
appt made and confirmed by pt for Monday 01-07-20 at 1545.

## 2020-01-07 ENCOUNTER — Encounter: Payer: Self-pay | Admitting: Neurology

## 2020-01-07 ENCOUNTER — Other Ambulatory Visit: Payer: Self-pay

## 2020-01-07 ENCOUNTER — Ambulatory Visit: Payer: BC Managed Care – PPO | Admitting: Neurology

## 2020-01-07 VITALS — BP 128/84 | HR 90 | Ht 71.0 in | Wt 188.0 lb

## 2020-01-07 DIAGNOSIS — G40909 Epilepsy, unspecified, not intractable, without status epilepticus: Secondary | ICD-10-CM

## 2020-01-07 DIAGNOSIS — G43009 Migraine without aura, not intractable, without status migrainosus: Secondary | ICD-10-CM | POA: Diagnosis not present

## 2020-01-07 MED ORDER — LAMOTRIGINE 25 MG PO TABS
ORAL_TABLET | ORAL | 0 refills | Status: DC
Start: 2020-01-07 — End: 2020-01-31

## 2020-01-07 NOTE — Patient Instructions (Signed)
Switch to Lamictal for seizure control, continue taking Topamax while increase the Lamictal  Lamictal Titration Instructions: 1 tablet twice a day for the first week 2 tablets twice a day for the second week 3 tablets twice a day for the third week 4 tablets twice a day for the fourth week  For total of 140 tablets  After finish titration with small dose of lamotrigine 25 mg, change to lamotrigine 100 mg twice a day  Send me a message once you complete the titration, and I will instruct you on weaning down the Topamax  Watch for rash  See you back in 4 months or sooner if needed

## 2020-01-07 NOTE — Progress Notes (Signed)
PATIENT: Elijah Huang DOB: 1981/03/13  REASON FOR VISIT: follow up Elijah FROM: patient  Elijah OF PRESENT ILLNESS: Today 01/07/20  Elijah Huang is a 39 years old right-handed Caucasian male, accompanied by his girlfriend, referred by his primary care physician Elijah Huang for evaluation of possible seizure, with his girl friend AprilHe had past medical Elijah of GERD, erectile dysfunction He presented with seizure-like event, the initial episode was witness by his girlfriend, Dec 07 2013, after waking up talking with his girlfriend for a while, shortly afterwards, he was noticed to lie on his left side, body shaking, loss of consciousness, lasting for a few minutes, with tongue biting, lip numbness, next day, in May 30th 2015, while taking a shower, his girlfriend heard a loud thundering, he was sitting in the shower, confused, did not know what has happened, no self injury, He denied previous Elijah of seizure, he presented to the emergency room Dec 09 2013, with constellation of complaints, intermittent chest pain, blurry vision, left eye pain, lightheadedness, He had a CAT scan of the brain that was normal, laboratory showed normal CBC, CMP, negative troponin, 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 Sept 24th 2015: At work, he became unresponsive to his manager, staring, hand  tremor, mild urinary incontinence He has no collection of hte events, no warning signs,  He noticed side effects on Depakote this includes weight gain, increased bilateral hands tremor , Depakote was stop in Nov 2015, started Keppra  UPDATE Elijah Huang 20 2016:He is doing well with keppra 1500mg  bid , Topamax 100mg  bid, no recurrent seizure, rarely has headache, Maxalt was helpful.  UPDATE 04/30/2015 CMMr. Hunger, 39 year old male returns for Huang he has been seen in the past by Dr. Krista Huang in our clinic . He has a Elijah of generalized seizure disorder and migraine headaches. He is currently on Keppra 1500 mg twice daily and Topamax 100mg  twice daily. Last seizure was 05/22/2014. He continues to have an occasional headache which is usually relieved with Maxalt. He needs refills on Maxalt. He is not aware of any food triggers. He returns for reevaluation  UPDATE 07/24/2015. CMMr. Elijah Huang, 39 year old male returns for Huang. He has a Elijah of seizure disorder and migraines. His migraines are in excellent control on Topamax 100 twice daily and Maxalt acutely. His last seizure event occurred in November 2015. He was last seen in the office 10/6/ 2016. At that time he was doing well on Keppra however he called in to the office several weeks ago with increased irritability and tiredness on the drug. He returns today to talk about changing his seizure medications. He has previously been on Depakote with continued seizures. His previous seizure episodes are described as his body gets stiff with jerking of the extremities and loss of consciousness. These have been witnessed by his girlfriend in the past. He returns for reevaluation UPDATE 04/19/2017CMMr.  Elijah Huang, 39 year old male returns for Huang he was last seen in the office 07/24/2015. He has Elijah of migraines that are in excellent control with Topamax also has a Elijah of seizure disorder some increased irritability and tiredness on Keppra reported at last  visit. He was successfully switched to Vimpat and has tapered off of Keppra without difficulty. He claims he feels much better. He returns for reevaluation no further seizure activity. UPDATE 10/31/2017CMMr. Elijah Huang, 39 year old male returns for Huang. He has a Elijah of seizure disorder with last seizure event occurring in November 2015. He is currently on Vimpat. He has Elijah of migraines and is on Topamax 100 mg twice daily Maxalt acutely. His headaches are in excellent control and he rarely uses Maxalt. He just got tired by the Elijah Huang [place] [employer]. He returns for reevaluation  UPDATE11/19/2018CMMr. Elijah Huang, 39 year old male returns for Huang with a Elijah of seizure disorder. Last seizure occurred in 2015. In addition he has Elijah of migraines. He was on Vimpat and Topamax when last seen however his insurance changed and he could not afford the Vimpat so his Topamax was increased. He is currently taking 300 mg total dose daily. He is now working third shift,and complains of inability to go to sleep. He is getting about 3-4 hours of sleep daily and his headaches and have increased since he has been on shift work. His primary care has given him some information on good sleep habits and hygiene. He was made aware that lack of sleep is not only a migraine trigger but a seizure trigger as well. Maxalt works acutely. He returns for reevaluation  Update Elijah Huang 1 2020 SS: Elijah Huang 39 year old male, Elijah of seizures and migraines, taking Topamax 100 mg, 1 in the morning, 2 at bedtime.He reports he may have had one episode of seizure last summer, but he is not sure it was a seizure. He reports he was in the bed had just woken up, was drowsy, remembers feeling like he could not new, lasting for a few seconds. He reports it subsided, he felt drowsy but was able to move. He denies any shaking or incontinence, oral trauma. He reports in the past this is been similar to his previous seizure  events. He does report that he may have missed some of his Topamax dosage during that time. He reports his initial seizure was in 2015, his most notable recent seizure was in 2017. Reports his headaches have been under good control, has not been having to take Maxalt for several months. He is currently working day shift, full-time, driving a car. In the past he has been unable to tolerate Depakote or Keppra. Previously he was on Topamax and Vimpat for seizures. He stopped his Vimpat during a period of time when he lost his insurance and the medication was too expensive. He denies any new problems or concerns.  Update Elijah Huang 29, 2021 SS: Elijah Huang unaccompanied, he remains on Topamax 100 mg in the morning, 200 mg at bedtime.  He was previously on Topamax 100 mg twice a day, along with Vimpat 50 mg twice a day, but he lost his insurance, at that time he came off Vimpat, and Topamax was increased.  He has not had recurrent seizure, there was some question of a possible seizure in summer 2019, possibly missing some dosages of Topamax.  His seizures are usually nocturnal, he is now living alone, no seizure that he knows of.  Historically, he knows he had seizure, he wakes  up the next morning feeling very drowsy, feeling "different".  He works day shift at [employer].  Indicates his headaches have been under good control, he no longer takes Maxalt.  He is due for physical.  He does report general fatigue, he feels is related to the Topamax.  He denies significant snoring, after work, he may doze on the couch, go to bed early.  He equates this with occurring since he went on a higher dose of Topamax.  Update January 07, 2020 SS: Elijah today to discuss medication adjustment, felt to have fatigue secondary to Topamax.  Currently taking Topamax 100 mg am/200 mg pm.  Recently saw PCP, no laboratory etiology for fatigue.  At one point was taking Topamax and then stopped due to loss of insurance,  even with insurance Vimpat was expensive.  Reports fatigue, mostly at the end of the day, does okay at work, feels over not much energy, sleeps well, not much snoring, no report of frequent morning headache.  Headaches remain well controlled, a few a month, responds well to Advil.  REVIEW OF SYSTEMS: Out of a complete 14 system review of symptoms, the patient complains only of the following symptoms, and all other reviewed systems are negative.  Seizure, headache  ALLERGIES: Allergies  Allergen Reactions  . Keppra [Levetiracetam] Other (See Comments)    irritability    HOME MEDICATIONS: Outpatient Medications Prior to Visit  Medication Sig Dispense Refill  . cetirizine (ZYRTEC) 10 MG tablet Take 10 mg by mouth daily as needed.     . Cholecalciferol (VITAMIN D3) 1000 units CAPS Take 1 capsule (1,000 Units total) by mouth daily. 30 capsule   . cyanocobalamin (V-R VITAMIN B-12) 500 MCG tablet Take 1 tablet (500 mcg total) by mouth every Monday, Wednesday, and Friday.    Marland Kitchen omeprazole (PRILOSEC) 40 MG capsule Take 1 capsule (40 mg total) by mouth every Monday, Wednesday, and Friday. 40 capsule 3  . topiramate (TOPAMAX) 100 MG tablet TAKE ONE TAB BY MOUTH EVERY MORNING AND TWO TABS EVERY EVENING. 90 tablet 3   No facility-administered medications prior to visit.    PAST MEDICAL Elijah: Past Medical Elijah:  Diagnosis Date  . 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 GERD (gastroesophageal reflux disease)   . Elijah of chicken pox   . Migraines   . Seizure disorder ([place]) 2015   presumed partial seizures, started on depakote, rec no driving for 6 mo (01/6159, Dr Addison Lank)    PAST SURGICAL Elijah: Past Surgical Elijah:  Procedure Laterality Date  . TONSILLECTOMY  1989    FAMILY Elijah: Family Elijah  Problem Relation Age of Onset  . Diabetes Father   . Coronary artery disease Father 35       cabg 41, nonsmoker, obese   . Cancer Father 70       prostate  . Coronary artery disease Paternal Grandfather 62       deceased from same  . Cancer Maternal Grandfather        pancreatic  . Stroke Neg Hx     SOCIAL Elijah: Social Elijah   Socioeconomic Elijah  . Marital status: Divorced    Spouse name: Not on file  . Number of children: 0  . Years of education: 40  . Highest education level: Not on file  Occupational Elijah  . Occupation: [employer]--works in [employer]  . Smoking status: Never Smoker  . Smokeless  tobacco: Former Systems developer    Types: Snuff  . Tobacco comment: occasional snuff use per chart  Substance and Sexual Activity  . Alcohol use: Yes    Alcohol/week: 0.0 standard drinks    Comment: occasional  . Drug use: No  . Sexual activity: Not on file  Other Topics Concern  . Not on file  Social Elijah Narrative   Caffeine: 1-2 soft drinks/day   Divorced    Lives with his girlfriend  1 dog   Occupation: works at [employer]: no regular activity   Diet: good amt water, lots of fried foods, few fruits/vegetables.   Social Determinants of Health   Financial Resource Strain:   . Difficulty of Paying Living Expenses:   Food Insecurity:   . Worried About Charity fundraiser in the Last Year:   . Arboriculturist in the Last Year:   Transportation Needs:   . Film/video editor (Medical):   Marland Kitchen Lack of Transportation (Non-Medical):   Physical Activity:   . Days of Exercise per Week:   . Minutes of Exercise per Session:   Stress:   . Feeling of Stress :   Social Connections:   . Frequency of Communication with Friends and Family:   . Frequency of Social Gatherings with Friends and Family:   . Attends Religious Services:   . Active Member of Clubs or Organizations:   . Attends Archivist Meetings:   Marland Kitchen Marital Status:   Intimate Partner Violence:   . Fear of Current or Ex-Partner:   . Emotionally Abused:   Marland Kitchen Physically Abused:   .  Sexually Abused:    PHYSICAL EXAM  Vitals:   01/07/20 1527  BP: 128/84  Pulse: 90  Weight: 188 lb (85.3 kg)  Height: 5\' 11"  (1.803 m)   Body mass index is 26.22 kg/m.  Generalized: Well developed, in no acute distress   Neurological examination  Mentation: Alert oriented to time, place, Elijah taking. Follows all commands speech and language fluent Cranial nerve II-XII: Pupils were equal round reactive to light. Extraocular movements were full, visual field were full on confrontational test. Facial sensation and strength were normal.  Head turning and shoulder shrug  were normal and symmetric. Motor: The motor testing reveals 5 over 5 strength of all 4 extremities. Good symmetric motor tone is noted throughout.  Sensory: Sensory testing is intact to soft touch on all 4 extremities. No evidence of extinction is noted.  Coordination: Cerebellar testing reveals good finger-nose-finger and heel-to-shin bilaterally.  Gait and station: Gait is normal.  Reflexes: Deep tendon reflexes are symmetric and normal bilaterally.   DIAGNOSTIC DATA (LABS, IMAGING, TESTING) - I reviewed patient records, labs, notes, testing and imaging myself where available.  Lab Results  Component Value Date   WBC 6.5 11/14/2019   HGB 15.5 11/14/2019   HCT 45.1 11/14/2019   MCV 84.3 11/14/2019   PLT 329.0 11/14/2019      Component Value Date/Time   NA 139 11/14/2019 1635   NA 144 10/12/2018 1529   K 4.1 11/14/2019 1635   CL 108 11/14/2019 1635   CO2 23 11/14/2019 1635   GLUCOSE 89 11/14/2019 1635   BUN 28 (H) 11/14/2019 1635   BUN 32 (H) 10/12/2018 1529   CREATININE 1.38 11/14/2019 1635   CREATININE 1.18 11/21/2015 1613   CALCIUM 9.7 11/14/2019 1635   PROT 7.4 11/14/2019 1635   PROT 7.0 10/12/2018 1529   ALBUMIN 4.9 11/14/2019 1635  ALBUMIN 4.9 10/12/2018 1529   AST 18 11/14/2019 1635   ALT 17 11/14/2019 1635   ALKPHOS 64 11/14/2019 1635   BILITOT 1.0 11/14/2019 1635   BILITOT 0.8  10/12/2018 1529   GFRNONAA 64 10/12/2018 1529   GFRAA 74 10/12/2018 1529   Lab Results  Component Value Date   CHOL 166 05/31/2018   HDL 68.30 05/31/2018   LDLCALC 73 05/31/2018   TRIG 120.0 05/31/2018   CHOLHDL 2 05/31/2018   No results found for: HGBA1C Lab Results  Component Value Date   SUPJSRPR94 585 11/14/2019   Lab Results  Component Value Date   TSH 1.97 11/14/2019   ASSESSMENT AND PLAN 39 y.o. year old male  has a past medical Elijah of ED (erectile dysfunction) of non-organic origin (2014), GERD (gastroesophageal reflux disease), Elijah of chicken pox, Migraines, and Seizure disorder ([place]) (2015). Elijah with:  1.  Seizures 2.  Migraines -Most recent seizure was in November 2015, possibly summer 2019 (missed doses of Topamax) -Was previously unable to tolerate Keppra or Depakote, Vimpat was too expensive -Continues to report side effect of fatigue, felt to be secondary to Topamax, had routine laboratory evaluation by PCP, no etiology of fatigue determined -Will start a switch to Lamictal, eventually working up to 100 mg am/200 mg pm (Dr. Rhea Belton suggestion) -Sent titration with 25 mg tablets to pharmacy-advised to watch for rash -Once gets to Lamictal 100 mg twice a day, will initiate Topamax taper, 100 mg twice daily x1 week, 100 mg at bedtime x1 week, 50 mg at bedtime x 1 week, then stop (will continue push up Lamictal 100 mg/200 mg) -Huang in 4 months or sooner if needed, to determine Lamictal tolerability  I spent 30 minutes of face-to-face and non-face-to-face time with patient.  This included previsit chart review, lab review, study review, order entry, electronic health record documentation, patient education.  Butler Denmark, AGNP-C, DNP 01/07/2020, 4:34 PM [facility] [address], [place], [place] [address] [phone]

## 2020-01-08 NOTE — Progress Notes (Signed)
rx faxed to [facility]

## 2020-01-31 ENCOUNTER — Telehealth: Payer: Self-pay | Admitting: Neurology

## 2020-01-31 ENCOUNTER — Encounter: Payer: Self-pay | Admitting: Neurology

## 2020-01-31 MED ORDER — LAMOTRIGINE 100 MG PO TABS
ORAL_TABLET | ORAL | 5 refills | Status: DC
Start: 2020-01-31 — End: 2020-10-29

## 2020-01-31 NOTE — Telephone Encounter (Signed)
I will send in Lamictal 100 mg tablets, 1 tablet twice daily. Will continue to increase Lamictal dosing, will weaning Topmax, instructions below:  -Once gets to Lamictal 100 mg twice a day, will initiate Topamax taper, 100 mg twice daily x1 week, 100 mg at bedtime x1 week, 50 mg at bedtime x 1 week, then stop  After being on Lamictal 100 mg twice daily x 1 week, increase up to 100 mg/150 mg x 1 week then 100mg /200 mg, stay at that dose.   Sent rx.

## 2020-01-31 NOTE — Telephone Encounter (Signed)
Attempted to call pt no vm to LM.

## 2020-05-26 ENCOUNTER — Encounter: Payer: Self-pay | Admitting: Neurology

## 2020-05-28 ENCOUNTER — Ambulatory Visit: Payer: BC Managed Care – PPO | Admitting: Neurology

## 2020-08-04 ENCOUNTER — Encounter: Payer: Self-pay | Admitting: Neurology

## 2020-09-09 DIAGNOSIS — U071 COVID-19: Secondary | ICD-10-CM

## 2020-09-09 HISTORY — DX: COVID-19: U07.1

## 2020-10-01 ENCOUNTER — Telehealth (INDEPENDENT_AMBULATORY_CARE_PROVIDER_SITE_OTHER): Payer: BC Managed Care – PPO | Admitting: Family Medicine

## 2020-10-01 ENCOUNTER — Other Ambulatory Visit: Payer: Self-pay

## 2020-10-01 ENCOUNTER — Other Ambulatory Visit: Payer: BC Managed Care – PPO

## 2020-10-01 ENCOUNTER — Encounter: Payer: Self-pay | Admitting: Family Medicine

## 2020-10-01 ENCOUNTER — Other Ambulatory Visit: Payer: Self-pay | Admitting: Family Medicine

## 2020-10-01 DIAGNOSIS — J069 Acute upper respiratory infection, unspecified: Secondary | ICD-10-CM

## 2020-10-01 DIAGNOSIS — J029 Acute pharyngitis, unspecified: Secondary | ICD-10-CM

## 2020-10-01 DIAGNOSIS — K219 Gastro-esophageal reflux disease without esophagitis: Secondary | ICD-10-CM | POA: Diagnosis not present

## 2020-10-01 NOTE — Assessment & Plan Note (Signed)
Some globus sensation the past mo  Aware-will watch  Adv watch diet/continue omeprazole

## 2020-10-01 NOTE — Assessment & Plan Note (Signed)
Suspect viral cold/uri  Pt will do covid test and update [place] (at home)  Planning strep test for ST as well

## 2020-10-01 NOTE — Progress Notes (Signed)
Pt tested positive for Covid per Home test. Needed PCR test for work.

## 2020-10-01 NOTE — Progress Notes (Signed)
Virtual Visit via Video Note  I connected with Elijah Huang on 10/01/20 at  9:30 AM EDT by a video enabled telemedicine application and verified that I am speaking with the correct person using two identifiers.  Location: Patient: home Provider: office   I discussed the limitations of evaluation and management by telemedicine and the availability of in person appointments. The patient expressed understanding and agreed to proceed.  Parties involved in encounter  Patient: Elijah Huang  Provider:  Loura Pardon MD    History of Present Illness:  Sore throat and congestion since Monday evening   Some body aches in pm/ early am  Took temp- 98.3 /normal  No chills   No headache  No ear pain  Mucous is pretty clear/hard to get out of nose  A little cough this am (scant phlegm)  No wheeze or chest tightness No GI symptoms   The last few weeks has felt something in throat occ  He takes omeprazole and it usually helps 40 mg three days per week  Not worse lately   Girlfriend has no symptoms  No known sick contacts  otc Nyquil/dayquil -helps  Zyrtec prn allergies -started back   Had covid vaccines and booster (moderna for booster)  Has not had covid   Patient Active Problem List   Diagnosis Date Noted  . Viral URI 10/01/2020  . Sore throat 10/01/2020  . Benign skin growth 06/07/2018  . Vitamin D deficiency 05/31/2018  . Low vitamin B12 level 05/31/2018  . 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 Solitary pulmonary nodule 10/24/2014  . Migraine without aura and without status migrainosus, not intractable 08/28/2014  . Seizure disorder ([place])   . Facial rash 08/09/2013  . Erectile dysfunction 05/30/2013  . GERD (gastroesophageal reflux disease) 05/04/2013  . Healthcare maintenance 04/21/2011  . Atypical nevi 04/21/2011  . Family history of early CAD 04/21/2011   Past Medical History:  Diagnosis Date  . ED (erectile dysfunction) of non-organic origin 2014   thought  psychogenic by urology [name])  . GERD (gastroesophageal reflux disease)   . History of chicken pox   . Migraines   . Seizure disorder ([place]) 2015   presumed partial seizures, started on depakote, rec no driving for 6 mo (10/6566, Dr Addison Lank)   Past Surgical History:  Procedure Laterality Date  . TONSILLECTOMY  1989   Social History   Tobacco Use  . Smoking status: Never Smoker  . Smokeless tobacco: Former Systems developer    Types: Snuff  . Tobacco comment: occasional snuff use per chart  Substance Use Topics  . Alcohol use: Yes    Alcohol/week: 0.0 standard drinks    Comment: occasional  . Drug use: No   Family History  Problem Relation Age of Onset  . Diabetes Father   . Coronary artery disease Father 40       cabg 98, nonsmoker, obese  . Cancer Father 31       prostate  . Coronary artery disease Paternal Grandfather 43       deceased from same  . Cancer Maternal Grandfather        pancreatic  . Stroke Neg Hx    Allergies  Allergen Reactions  . Keppra [Levetiracetam] Other (See Comments)    irritability   Current Outpatient Medications on File Prior to Visit  Medication Sig Dispense Refill  . cetirizine (ZYRTEC) 10 MG tablet Take 10 mg by mouth daily as needed.     . Cholecalciferol (VITAMIN D3) 1000  units CAPS Take 1 capsule (1,000 Units total) by mouth daily. 30 capsule   . cyanocobalamin (V-R VITAMIN B-12) 500 MCG tablet Take 1 tablet (500 mcg total) by mouth every Monday, Wednesday, and Friday.    . lamoTRIgine (LAMICTAL) 100 MG tablet Take 1 tablet twice daily x 1 week, then take 1 in the morning and 1.5 tablets in the evening x 1 week, then take 1 in the morning, 2 at bedtime 120 tablet 5  . omeprazole (PRILOSEC) 40 MG capsule Take 1 capsule (40 mg total) by mouth every Monday, Wednesday, and Friday. 40 capsule 3   No current facility-administered medications on file prior to visit.   Review of Systems  Constitutional: Positive for malaise/fatigue. Negative for chills  and fever.  HENT: Positive for congestion and sore throat. Negative for ear pain and sinus pain.   Eyes: Negative for blurred vision, discharge and redness.  Respiratory: Positive for cough and sputum production. Negative for shortness of breath, wheezing and stridor.   Cardiovascular: Negative for chest pain, palpitations and leg swelling.  Gastrointestinal: Negative for abdominal pain, diarrhea, nausea and vomiting.  Musculoskeletal: Negative for myalgias.  Skin: Negative for rash.  Neurological: Negative for dizziness and headaches.     Observations/Objective:   Assessment and Plan: Problem List Items Addressed This Visit      Respiratory   Viral URI    Suspect viral cold/uri  Pt will do covid test and update [place] (at home)  Planning strep test for ST as well        Digestive   GERD (gastroesophageal reflux disease)    Some globus sensation the past mo  Aware-will watch  Adv watch diet/continue omeprazole        Other   Sore throat    Suspect due to viral uri  Strep test planned Also disc GERD control          Follow Up Instructions: Drink fluids and rest Continue the nyquil and day quil products if they help Zyrtec is ok for runny nose  Salt water gargle may help sore throat   The office will call you to set up swabs for strep and covid 19  Stay out of work until results return  Update if not starting to improve in a week or if worsening     I discussed the assessment and treatment plan with the patient. The patient was provided an opportunity to ask questions and all were answered. The patient agreed with the plan and demonstrated an understanding of the instructions.   The patient was advised to call back or seek an in-person evaluation if the symptoms worsen or if the condition fails to improve as anticipated.     Loura Pardon, MD

## 2020-10-01 NOTE — Assessment & Plan Note (Signed)
Suspect due to viral uri  Strep test planned Also disc GERD control

## 2020-10-01 NOTE — Patient Instructions (Addendum)
Drink fluids and rest Continue the nyquil and day quil products if they help Zyrtec is ok for runny nose  Salt water gargle may help sore throat   The office will call you to set up swabs for strep and covid 19  Stay out of work until results return  Update if not starting to improve in a week or if worsening

## 2020-10-02 ENCOUNTER — Other Ambulatory Visit: Payer: BC Managed Care – PPO

## 2020-10-02 LAB — SARS-COV-2, NAA 2 DAY TAT

## 2020-10-02 LAB — NOVEL CORONAVIRUS, NAA: SARS-CoV-2, NAA: DETECTED — AB

## 2020-10-03 ENCOUNTER — Telehealth: Payer: Self-pay | Admitting: Unknown Physician Specialty

## 2020-10-03 NOTE — Telephone Encounter (Signed)
Called to discuss with patient about COVID-19 symptoms and the use of one of the available treatments for those with mild to moderate Covid symptoms and at a high risk of hospitalization.  Pt appears to qualify for outpatient treatment due to co-morbid conditions and/or a member of an at-risk group in accordance with the FDA Emergency Use Authorization.    Symptom onset: 3/21 Vaccinated: yes Booster?  I  Unable to reach pt - [place]

## 2020-10-29 ENCOUNTER — Other Ambulatory Visit: Payer: Self-pay | Admitting: Neurology

## 2020-10-30 ENCOUNTER — Encounter: Payer: Self-pay | Admitting: Family Medicine

## 2020-11-11 ENCOUNTER — Encounter: Payer: Self-pay | Admitting: Neurology

## 2020-11-11 ENCOUNTER — Other Ambulatory Visit: Payer: Self-pay

## 2020-11-11 ENCOUNTER — Ambulatory Visit: Payer: BC Managed Care – PPO | Admitting: Neurology

## 2020-11-11 VITALS — BP 123/83 | HR 90 | Ht 71.0 in | Wt 163.0 lb

## 2020-11-11 DIAGNOSIS — G40909 Epilepsy, unspecified, not intractable, without status epilepticus: Secondary | ICD-10-CM

## 2020-11-11 MED ORDER — LAMOTRIGINE 100 MG PO TABS: ORAL_TABLET | ORAL | 4 refills | Status: DC

## 2020-11-11 NOTE — Patient Instructions (Signed)
Check blood work today  Continue the Lamictal  Call for seizures See you back 1 year

## 2020-11-11 NOTE — Progress Notes (Signed)
PATIENT: Elijah Huang DOB: [birth year]  REASON FOR VISIT: follow up HISTORY FROM: patient  HISTORY OF PRESENT ILLNESS: Today 11/11/20  HISTORY HISTORY Elijah Huang is a 40 years old right-handed Caucasian male, accompanied by his girlfriend, referred by his primary care physician Dr. Danise Mina for evaluation of possible seizure, with his girl friend AprilHe had past medical history of GERD, erectile dysfunction He presented with seizure-like event, the initial episode was witness by his girlfriend, Dec 07 2013, after waking up talking with his girlfriend for a while, shortly afterwards, he was noticed to lie on his left side, body shaking, loss of consciousness, lasting for a few minutes, with tongue biting, lip numbness, next day, in May 30th 2015, while taking a shower, his girlfriend heard a loud thundering, he was sitting in the shower, confused, did not know what has happened, no self injury, He denied previous history of seizure, he presented to the emergency room Dec 09 2013, with constellation of complaints, intermittent chest pain, blurry vision, left eye pain, lightheadedness, He had a CAT scan of the brain that was normal, laboratory showed normal CBC, CMP, negative troponin, 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 Sept 24th 2015: At work, he became unresponsive to his manager, staring, hand  tremor, mild urinary incontinence He has no collection of hte events, no warning signs,  He noticed side effects on Depakote this includes weight gain, increased bilateral hands tremor , Depakote was stop in Nov 2015, started Keppra  UPDATE October 30 2014:He is doing well with keppra 1500mg  bid , Topamax 100mg  bid, no recurrent seizure, rarely has headache, Maxalt was helpful.  UPDATE 04/30/2015 CMMr. Huang, 40 year old male returns for follow-up he has been seen in the past by Dr. Krista Blue in our clinic . He has a history of generalized seizure disorder and migraine headaches. He is currently on Keppra 1500 mg twice daily and Topamax 100mg  twice daily. Last seizure was 05/22/2014. He continues to have an occasional headache which is usually relieved with Maxalt. He needs refills on Maxalt. He is not aware of any food triggers. He returns for reevaluation  UPDATE 07/24/2015. CMMr. Huang, 40 year old male returns for follow-up. He has a history of seizure disorder and migraines. His migraines are in excellent control on Topamax 100 twice daily and Maxalt acutely. His last seizure event occurred in November 2015. He was last seen in the office 10/6/ 2016. At that time he was doing well on Keppra however he called in to the office several weeks ago with increased irritability and tiredness on the drug. He returns today to talk about changing his seizure medications. He has previously been on Depakote with continued seizures. His previous seizure episodes are described as his body gets stiff with jerking of the extremities and loss of consciousness. These have been witnessed by his girlfriend in the past. He returns for reevaluation UPDATE 04/19/2017CMMr.  Huang, 40 year old male returns for follow-up he was last seen in the office 07/24/2015. He has history of migraines that are in excellent control with Topamax also has a history of seizure disorder some increased irritability and tiredness on Keppra reported at last  visit. He was successfully switched to Vimpat and has tapered off of Keppra without difficulty. He claims he feels much better. He returns for reevaluation no further seizure activity. UPDATE 10/31/2017CMMr. Sloane, 40 year old male returns for follow-up. He has a history of seizure disorder with last seizure event occurring in November 2015. He is currently on Vimpat. He has history of migraines and is on Topamax 100 mg twice daily Maxalt acutely. His headaches are in excellent control and he rarely uses Maxalt. He just got tired by the [facility]. He returns for reevaluation  UPDATE11/19/2018CMMr. Huang, 40 year old male returns for follow-up with a history of seizure disorder. Last seizure occurred in 2015. In addition he has history of migraines. He was on Vimpat and Topamax when last seen however his insurance changed and he could not afford the Vimpat so his Topamax was increased. He is currently taking 300 mg total dose daily. He is now working third shift,and complains of inability to go to sleep. He is getting about 3-4 hours of sleep daily and his headaches and have increased since he has been on shift work. His primary care has given him some information on good sleep habits and hygiene. He was made aware that lack of sleep is not only a migraine trigger but a seizure trigger as well. Maxalt works acutely. He returns for reevaluation  Update October 11 2018 SS: Elijah Huang 40 year old male, history of seizures and migraines, taking Topamax 100 mg, 1 in the morning, 2 at bedtime.He reports he may have had one episode of seizure last summer, but he is not sure it was a seizure. He reports he was in the bed had just woken up, was drowsy, remembers feeling like he could not new, lasting for a few seconds. He reports it subsided, he felt drowsy but was able to move. He denies any shaking or incontinence, oral trauma. He reports in the past this is been similar to his previous seizure  events. He does report that he may have missed some of his Topamax dosage during that time. He reports his initial seizure was in 2015, his most notable recent seizure was in 2017. Reports his headaches have been under good control, has not been having to take Maxalt for several months. He is currently working day shift, full-time, driving a car. In the past he has been unable to tolerate Depakote or Keppra. Previously he was on Topamax and Vimpat for seizures. He stopped his Vimpat during a period of time when he lost his insurance and the medication was too expensive. He denies any new problems or concerns.  Update November 08, 2019 SS: Here today for follow-up unaccompanied, he remains on Topamax 100 mg in the morning, 200 mg at bedtime.  He was previously on Topamax 100 mg twice a day, along with Vimpat 50 mg twice a day, but he lost his insurance, at that time he came off Vimpat, and Topamax was increased.  He has not had recurrent seizure, there was some question of a possible seizure in summer 2019, possibly missing some dosages of Topamax.  His seizures are usually nocturnal, he is now living alone, no seizure that he knows of.  Historically, he knows he had seizure, he wakes  up the next morning feeling very drowsy, feeling "different".  He works day shift at [employer].  Indicates his headaches have been under good control, he no longer takes Maxalt.  He is due for physical.  He does report general fatigue, he feels is related to the Topamax.  He denies significant snoring, after work, he may doze on the couch, go to bed early.  He equates this with occurring since he went on a higher dose of Topamax.  Update January 07, 2020 SS: Here today to discuss medication adjustment, felt to have fatigue secondary to Topamax.  Currently taking Topamax 100 mg am/200 mg pm.  Recently saw PCP, no laboratory etiology for fatigue.  At one point was taking Topamax and then stopped due to loss of insurance,  even with insurance Vimpat was expensive.  Reports fatigue, mostly at the end of the day, does okay at work, feels over not much energy, sleeps well, not much snoring, no report of frequent morning headache.  Headaches remain well controlled, a few a month, responds well to Advil.  Update Nov 11, 2020 SS: Off Topamax due to to fatigue, has noted way less fatigue. On Lamictal 100 mg AM/200 mg PM. Only side effects: occasional vivid dreams, few times felt disconnected, but not like seizure. No seizures. Headaches are well controlled, will take occasional Advil. Work is going well, full time. No change to health.   REVIEW OF SYSTEMS: Out of a complete 14 system review of symptoms, the patient complains only of the following symptoms, and all other reviewed systems are negative.  Seizure  ALLERGIES: Allergies  Allergen Reactions  . Keppra [Levetiracetam] Other (See Comments)    irritability    HOME MEDICATIONS: Outpatient Medications Prior to Visit  Medication Sig Dispense Refill  . cetirizine (ZYRTEC) 10 MG tablet Take 10 mg by mouth daily as needed.     . Cholecalciferol (VITAMIN D3) 1000 units CAPS Take 1 capsule (1,000 Units total) by mouth daily. 30 capsule   . cyanocobalamin (V-R VITAMIN B-12) 500 MCG tablet Take 1 tablet (500 mcg total) by mouth every Monday, Wednesday, and Friday.    Marland Kitchen omeprazole (PRILOSEC) 40 MG capsule Take 1 capsule (40 mg total) by mouth every Monday, Wednesday, and Friday. 40 capsule 3  . lamoTRIgine (LAMICTAL) 100 MG tablet TAKE 1 TABLET BY MOUTH TWICE DAILY X 1 WEEK, THEN 1 TAB IN THE AM AND 1.5 TABS IN THE PM X 1 WEEK, THEN 1 TAB IN THE AMAND 2 TABS AT BEDTIME THEREAFTER. 90 tablet 0   No facility-administered medications prior to visit.    PAST MEDICAL HISTORY: Past Medical History:  Diagnosis Date  . COVID-19 virus infection 09/2020  . 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 GERD  (gastroesophageal reflux disease)   . History of chicken pox   . Migraines   . Seizure disorder ([place]) 2015   presumed partial seizures, started on depakote, rec no driving for 6 mo (10/84, Dr Addison Lank)    PAST SURGICAL HISTORY: Past Surgical History:  Procedure Laterality Date  . TONSILLECTOMY  1989    FAMILY HISTORY: Family History  Problem Relation Age of Onset  . Diabetes Father   . Coronary artery disease Father 17       cabg 66, nonsmoker, obese  . Cancer Father 40       prostate  . Coronary artery disease Paternal Grandfather 19  deceased from same  . Cancer Maternal Grandfather        pancreatic  . Stroke Neg Hx     SOCIAL HISTORY: Social History   Socioeconomic History  . Marital status: Divorced    Spouse name: Not on file  . Number of children: 0  . Years of education: 19  . Highest education level: Not on file  Occupational History  . Occupation: [employer]--works in [place] Use  . Smoking status: Never Smoker  . Smokeless tobacco: Former Systems developer    Types: Snuff  . Tobacco comment: occasional snuff use per chart  Substance and Sexual Activity  . Alcohol use: Yes    Alcohol/week: 0.0 standard drinks    Comment: occasional  . Drug use: No  . Sexual activity: Not on file  Other Topics Concern  . Not on file  Social History Narrative   Caffeine: 1-2 soft drinks/day   Divorced    Lives with his girlfriend  1 dog   Occupation: works at [employer]: no regular activity   Diet: good amt water, lots of fried foods, few fruits/vegetables.   Social Determinants of Health   Financial Resource Strain: Not on file  Food Insecurity: Not on file  Transportation Needs: Not on file  Physical Activity: Not on file  Stress: Not on file  Social Connections: Not on file  Intimate Partner Violence: Not on file   PHYSICAL EXAM  Vitals:   11/11/20 1533  BP: 123/83  Pulse: 90  Weight: 163 lb (73.9 kg)  Height: 5\' 11"  (1.803  m)   Body mass index is 22.73 kg/m.  Generalized: Well developed, in no acute distress   Neurological examination  Mentation: Alert oriented to time, place, history taking. Follows all commands speech and language fluent Cranial nerve II-XII: Pupils were equal round reactive to light. Extraocular movements were full, visual field were full on confrontational test. Facial sensation and strength were normal.  Head turning and shoulder shrug  were normal and symmetric. Motor: The motor testing reveals 5 over 5 strength of all 4 extremities. Good symmetric motor tone is noted throughout.  Sensory: Sensory testing is intact to soft touch on all 4 extremities. No evidence of extinction is noted.  Coordination: Cerebellar testing reveals good finger-nose-finger and heel-to-shin bilaterally.  Gait and station: Gait is normal.  Reflexes: Deep tendon reflexes are symmetric and normal bilaterally.   DIAGNOSTIC DATA (LABS, IMAGING, TESTING) - I reviewed patient records, labs, notes, testing and imaging myself where available.  Lab Results  Component Value Date   WBC 6.5 11/14/2019   HGB 15.5 11/14/2019   HCT 45.1 11/14/2019   MCV 84.3 11/14/2019   PLT 329.0 11/14/2019      Component Value Date/Time   NA 139 11/14/2019 1635   NA 144 10/12/2018 1529   K 4.1 11/14/2019 1635   CL 108 11/14/2019 1635   CO2 23 11/14/2019 1635   GLUCOSE 89 11/14/2019 1635   BUN 28 (H) 11/14/2019 1635   BUN 32 (H) 10/12/2018 1529   CREATININE 1.38 11/14/2019 1635   CREATININE 1.18 11/21/2015 1613   CALCIUM 9.7 11/14/2019 1635   PROT 7.4 11/14/2019 1635   PROT 7.0 10/12/2018 1529   ALBUMIN 4.9 11/14/2019 1635   ALBUMIN 4.9 10/12/2018 1529   AST 18 11/14/2019 1635   ALT 17 11/14/2019 1635   ALKPHOS 64 11/14/2019 1635   BILITOT 1.0 11/14/2019 1635   BILITOT 0.8 10/12/2018 1529   GFRNONAA 64  10/12/2018 1529   GFRAA 74 10/12/2018 1529   Lab Results  Component Value Date   CHOL 166 05/31/2018   HDL 68.30  05/31/2018   LDLCALC 73 05/31/2018   TRIG 120.0 05/31/2018   CHOLHDL 2 05/31/2018   No results found for: HGBA1C Lab Results  Component Value Date   [id number] 11/14/2019   Lab Results  Component Value Date   TSH 1.97 11/14/2019   ASSESSMENT AND PLAN 40 y.o. year old male  has a past medical history of COVID-19 virus infection (09/2020), ED (erectile dysfunction) of non-organic origin (2014), GERD (gastroesophageal reflux disease), History of chicken pox, Migraines, and Seizure disorder ([place]) (2015). here with:  1.  Seizures 2.  Migraines -Most recent seizure was in November 2015, possibly summer 2019 (missed doses of Topamax) -Was previously unable to tolerate Keppra or Depakote, Vimpat was too expensive; Topamax resulted in fatigue -Doing well on Lamictal, continue Lamictal 100 mg AM/200 mg PM -Check routine labs -Call for seizure activity, otherwise follow-up 1 year or sooner if needed  Evangeline Dakin, DNP 11/11/2020, 3:54 PM [facility] [address], [place], [place] [address] [phone]

## 2020-11-12 LAB — CBC WITH DIFFERENTIAL/PLATELET
Basophils Absolute: 0 10*3/uL (ref 0.0–0.2)
Basos: 1 %
EOS (ABSOLUTE): 0.1 10*3/uL (ref 0.0–0.4)
Eos: 2 %
Hematocrit: 42.2 % (ref 37.5–51.0)
Hemoglobin: 14.4 g/dL (ref 13.0–17.7)
Immature Grans (Abs): 0.1 10*3/uL (ref 0.0–0.1)
Immature Granulocytes: 1 %
Lymphocytes Absolute: 1.7 10*3/uL (ref 0.7–3.1)
Lymphs: 27 %
MCH: 28.6 pg (ref 26.6–33.0)
MCHC: 34.1 g/dL (ref 31.5–35.7)
MCV: 84 fL (ref 79–97)
Monocytes Absolute: 0.5 10*3/uL (ref 0.1–0.9)
Monocytes: 8 %
Neutrophils Absolute: 3.9 10*3/uL (ref 1.4–7.0)
Neutrophils: 61 %
Platelets: 367 10*3/uL (ref 150–450)
RBC: 5.03 x10E6/uL (ref 4.14–5.80)
RDW: 13.4 % (ref 11.6–15.4)
WBC: 6.4 10*3/uL (ref 3.4–10.8)

## 2020-11-12 LAB — COMPREHENSIVE METABOLIC PANEL
ALT: 20 IU/L (ref 0–44)
AST: 24 IU/L (ref 0–40)
Albumin/Globulin Ratio: 2.2 (ref 1.2–2.2)
Albumin: 4.7 g/dL (ref 4.0–5.0)
Alkaline Phosphatase: 91 IU/L (ref 44–121)
BUN/Creatinine Ratio: 15 (ref 9–20)
BUN: 18 mg/dL (ref 6–20)
Bilirubin Total: 0.5 mg/dL (ref 0.0–1.2)
CO2: 24 mmol/L (ref 20–29)
Calcium: 9.7 mg/dL (ref 8.7–10.2)
Chloride: 102 mmol/L (ref 96–106)
Creatinine, Ser: 1.23 mg/dL (ref 0.76–1.27)
Globulin, Total: 2.1 g/dL (ref 1.5–4.5)
Glucose: 89 mg/dL (ref 65–99)
Potassium: 3.8 mmol/L (ref 3.5–5.2)
Sodium: 143 mmol/L (ref 134–144)
Total Protein: 6.8 g/dL (ref 6.0–8.5)
eGFR: 78 mL/min/{1.73_m2} (ref >59)

## 2020-11-12 LAB — LAMOTRIGINE LEVEL: Lamotrigine Lvl: 8.4 ug/mL (ref 2.0–20.0)

## 2020-11-13 ENCOUNTER — Telehealth: Payer: Self-pay

## 2020-11-13 NOTE — Telephone Encounter (Signed)
-----   Message from Suzzanne Cloud, NP sent at 11/12/2020  1:41 PM EDT ----- Sent my chart message: Elijah Huang,  Blood work is unremarkable.  Lamictal level is within therapeutic range.  Continue with current dosing.  Call for any seizures. Take Care,  Judson Roch

## 2020-11-25 ENCOUNTER — Ambulatory Visit (INDEPENDENT_AMBULATORY_CARE_PROVIDER_SITE_OTHER): Payer: BC Managed Care – PPO | Admitting: Nurse Practitioner

## 2020-11-25 ENCOUNTER — Encounter: Payer: Self-pay | Admitting: Nurse Practitioner

## 2020-11-25 ENCOUNTER — Other Ambulatory Visit: Payer: Self-pay

## 2020-11-25 VITALS — BP 114/74 | HR 81 | Temp 99.3°F | Ht 71.0 in | Wt 163.2 lb

## 2020-11-25 DIAGNOSIS — R1310 Dysphagia, unspecified: Secondary | ICD-10-CM | POA: Diagnosis not present

## 2020-11-25 DIAGNOSIS — Z7689 Persons encountering health services in other specified circumstances: Secondary | ICD-10-CM | POA: Diagnosis not present

## 2020-11-25 DIAGNOSIS — K219 Gastro-esophageal reflux disease without esophagitis: Secondary | ICD-10-CM

## 2020-11-25 DIAGNOSIS — G40909 Epilepsy, unspecified, not intractable, without status epilepticus: Secondary | ICD-10-CM | POA: Diagnosis not present

## 2020-11-25 MED ORDER — OMEPRAZOLE 40 MG PO CPDR: 40.0000 mg | DELAYED_RELEASE_CAPSULE | ORAL | 3 refills | Status: DC

## 2020-11-25 NOTE — Progress Notes (Signed)
New Patient Office Visit  Subjective:  Patient ID: Elijah Huang, male    DOB: Jul 27, 1980  Age: 40 y.o. MRN: [id number]  CC:  Chief Complaint  Patient presents with  . New Patient (Initial Visit)    HPI Elijah Huang presents to establish new primary care provider. He is coming from a [employer] [place]. He is hoping to establish care closer to Is home. He states that he has been having some difficulty swallowing. He states that taking medication, especially tablets. He states that he does have to take his Lamictal in half. Even then, it is getting cough in his his throat. States that he has had GERD in the past. Does take omeprazole. Helps very little with the sensation that things are getting stuck in his throat.  He has history of seizures. Is currently followed per [facility]. Currently on lamictal and has been on this medicatoin for about a year. Has been seiure-free for some time. He states that he has no negative side effects from the lamictal.  He denies other concerns or complaints today. He denies chest pain, chest pressure, or shortness of breath. He denies headaches or visual disturbances. He denies abdominal pain, nausea, vomiting, or changes in bowel or bladder habits.   Past Medical History:  Diagnosis Date  . COVID-19 virus infection 09/2020  . 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 GERD (gastroesophageal reflux disease)   . History of chicken pox   . Migraines   . Seizure disorder ([place]) 2015   presumed partial seizures, started on depakote, rec no driving for 6 mo (0/6269, Dr Addison Lank)    Past Surgical History:  Procedure Laterality Date  . TONSILLECTOMY  1989    Family History  Problem Relation Age of Onset  . Diabetes Father   . Coronary artery disease Father 72       cabg 96, nonsmoker, obese  . Cancer Father 67       prostate  . Coronary artery disease Paternal Grandfather 28        deceased from same  . Cancer Maternal Grandfather        pancreatic  . Stroke Neg Hx     Social History   Socioeconomic History  . Marital status: Divorced    Spouse name: Not on file  . Number of children: 0  . Years of education: 11  . Highest education level: Not on file  Occupational History  . Occupation: [employer]--works in [place] Use  . Smoking status: Never Smoker  . Smokeless tobacco: Former Systems developer    Types: Chew  . Tobacco comment: occasional snuff use per chart  Substance and Sexual Activity  . Alcohol use: Yes    Alcohol/week: 0.0 standard drinks    Comment: occasional  . Drug use: No  . Sexual activity: Yes    Partners: Female  Other Topics Concern  . Not on file  Social History Narrative   Caffeine: 1-2 soft drinks/day   Divorced    Lives with his girlfriend  1 dog   Occupation: works at [employer]: no regular activity   Diet: good amt water, lots of fried foods, few fruits/vegetables.   Social Determinants of Health   Financial Resource Strain: Not on file  Food Insecurity: Not on file  Transportation Needs: Not on file  Physical Activity: Not on file  Stress: Not on file  Social Connections: Not  on file  Intimate Partner Violence: Not on file    ROS Review of Systems  Constitutional: Negative for chills and fever.  HENT: Positive for trouble swallowing. Negative for congestion, postnasal drip, rhinorrhea, sinus pressure and sinus pain.        Feels as though things are getting stuck in his throat, especially medications in tablet form.   Eyes: Negative.   Respiratory: Negative for cough, chest tightness and wheezing.   Cardiovascular: Negative for chest pain and palpitations.  Gastrointestinal: Negative for constipation.  Endocrine: Negative for cold intolerance, heat intolerance, polydipsia and polyuria.  Genitourinary: Negative.   Musculoskeletal: Negative for back pain and myalgias.  Skin: Negative for rash.   Allergic/Immunologic: Negative.   Neurological: Positive for seizures. Negative for dizziness, weakness and headaches.       Histolry of seizure. Lamonte Sakai been seizure free fore some time.   Psychiatric/Behavioral: The patient is not nervous/anxious.     Objective:   Today's Vitals   11/25/20 1609  BP: 114/74  Pulse: 81  Temp: 99.3 F (37.4 C)  SpO2: 98%  Weight: 163 lb 3.2 oz (74 kg)  Height: 5\' 11"  (1.803 m)   Body mass index is 22.76 kg/m. Physical Exam Vitals and nursing note reviewed.  Constitutional:      Appearance: Normal appearance. He is well-developed.  HENT:     Head: Normocephalic and atraumatic.     Nose: Nose normal.  Eyes:     Extraocular Movements: Extraocular movements intact.     Conjunctiva/sclera: Conjunctivae normal.     Pupils: Pupils are equal, round, and reactive to light.  Neck:     Comments: Some fullness in area of thyroid palpated today.  Cardiovascular:     Rate and Rhythm: Normal rate and regular rhythm.     Pulses: Normal pulses.     Heart sounds: Normal heart sounds.  Pulmonary:     Effort: Pulmonary effort is normal.     Breath sounds: Normal breath sounds.  Abdominal:     Palpations: Abdomen is soft.  Musculoskeletal:        General: Normal range of motion.     Cervical back: Normal range of motion and neck supple.  Skin:    General: Skin is warm and dry.     Capillary Refill: Capillary refill takes less than 2 seconds.  Neurological:     General: No focal deficit present.     Mental Status: He is alert and oriented to person, place, and time.  Psychiatric:        Mood and Affect: Mood normal.        Behavior: Behavior normal.        Thought Content: Thought content normal.        Judgment: Judgment normal.     Assessment & Plan:  1. Encounter to establish care Appointment today to establish new primary care provider.   2. Dysphagia, unspecified type May be due to GERD or slightly enlarged thyroid. Increase dose  omeprazole. Will check thyroid panel prior to next visit and assess further at next visit.   3. Gastroesophageal reflux disease without esophagitis Increase dose omeprazole to 40mg  daily. Reassess at next visit.  - omeprazole (PRILOSEC) 40 MG capsule; Take 1 capsule (40 mg total) by mouth every Monday, Wednesday, and Friday.  Dispense: 40 capsule; Refill: 3  4. Seizure disorder ([place]) Stable. Followed by [facility].  Problem List Items Addressed This Visit      Digestive   GERD (gastroesophageal  reflux disease)   Relevant Medications   omeprazole (PRILOSEC) 40 MG capsule   Dysphagia     Nervous and Auditory   Seizure disorder ([place])     Other   Encounter to establish care - Primary      Outpatient Encounter Medications as of 11/25/2020  Medication Sig  . cetirizine (ZYRTEC) 10 MG tablet Take 10 mg by mouth daily as needed.   . Cholecalciferol (VITAMIN D3) 1000 units CAPS Take 1 capsule (1,000 Units total) by mouth daily.  . cyanocobalamin (V-R VITAMIN B-12) 500 MCG tablet Take 1 tablet (500 mcg total) by mouth every Monday, Wednesday, and Friday.  . lamoTRIgine (LAMICTAL) 100 MG tablet Take 1 in the morning, take 2 at night  . [DISCONTINUED] omeprazole (PRILOSEC) 40 MG capsule Take 1 capsule (40 mg total) by mouth every Monday, Wednesday, and Friday.  Marland Kitchen omeprazole (PRILOSEC) 40 MG capsule Take 1 capsule (40 mg total) by mouth every Monday, Wednesday, and Friday.   No facility-administered encounter medications on file as of 11/25/2020.    Follow-up: Return in about 6 weeks (around 01/06/2021) for physical - FBW about a week before. add Free T4, Z61, folic acid, and vitamin d. thanks. Marland Kitchen   Ronnell Freshwater, NP

## 2020-11-25 NOTE — Patient Instructions (Signed)
Bradley's Neurology in Clinical Practice (8th ed., pp. 152-163). [place], [place]: Elsevier."> [employer] Textbook of Surgery (21st ed., pp. 1056-1078). [place], [place]: [employer] Inc.">  Dysphagia  Dysphagia is trouble swallowing. This condition occurs when solids and liquids stick in a person's throat on the way down to the stomach, or when food takes longer to get to the stomach than usual. You may have problems swallowing food, liquids, or both. You may also have pain while trying to swallow. It may take you more time and effort to swallow something. What are the causes? This condition may be caused by:  Muscle problems. These may make it difficult for you to move food and liquids through the esophagus, which is the tube that connects your mouth to your stomach.  Blockages. You may have ulcers, scar tissue, or inflammation that blocks the normal passage of food and liquids. Causes of these problems include: ? Acid reflux from your stomach into your esophagus (gastroesophageal reflux). ? Infections. ? Radiation treatment for cancer. ? Medicines taken without enough fluids to wash them down into your stomach.  Stroke. This can affect the nerves and make it difficult to swallow.  Nerve problems. These prevent signals from being sent to the muscles of your esophagus to squeeze (contract) and move what you swallow down to your stomach.  Globus pharyngeus. This is a common problem that involves a feeling like something is stuck in your throat or a sense of trouble with swallowing, even though nothing is wrong with the swallowing passages.  Certain conditions, such as cerebral palsy or Parkinson's disease. What are the signs or symptoms? Common symptoms of this condition include:  A feeling that solids or liquids are stuck in your throat on the way down to the stomach.  Pain while swallowing.  Coughing or gagging while trying to swallow. Other symptoms include:  Food moving back from  your stomach to your mouth (regurgitation).  Noises coming from your throat.  Chest discomfort when swallowing.  A feeling of fullness when swallowing.  Drooling, especially when the throat is blocked.  Heartburn. How is this diagnosed? This condition may be diagnosed by:  Barium swallow X-Nordahl. In this test, you will swallow a white liquid that sticks to the inside of your esophagus. X-Suminski images are then taken.  Endoscopy. In this test, a flexible telescope is inserted down your throat to look at your esophagus and your stomach.  CT scans or an MRI. How is this treated? Treatment for dysphagia depends on the cause of this condition:  If the dysphagia is caused by acid reflux or infection, medicines may be used. These may include antibiotics or heartburn medicines.  If the dysphagia is caused by problems with the muscles, swallowing therapy may be used to help you strengthen your swallowing muscles. You may have to do specific exercises to strengthen the muscles or stretch them.  If the dysphagia is caused by a blockage or mass, procedures to remove the blockage may be done. You may need surgery and a feeding tube. You may need to make diet changes. Ask your health care provider for specific instructions. Follow these instructions at home: Medicines  Take over-the-counter and prescription medicines only as told by your health care provider.  If you were prescribed an antibiotic medicine, take it as told by your health care provider. Do not stop taking the antibiotic even if you start to feel better. Eating and drinking  Make any diet changes as told by your health care provider.    Work with a diet and nutrition specialist (dietitian) to create an eating plan that will help you get the nutrients you need in order to stay healthy.  Eat soft foods that are easier to swallow.  Cut your food into small pieces and eat slowly. Take small bites.  Eat and drink only when you are  sitting upright.  Do not drink alcohol or caffeine. If you need help quitting, ask your health care provider.   General instructions  Check your weight every day to make sure you are not losing weight.  Do not use any products that contain nicotine or tobacco. These products include cigarettes, chewing tobacco, and vaping devices, such as e-cigarettes. If you need help quitting, ask your health care provider.  Keep all follow-up visits. This is important. Contact a health care provider if:  You lose weight because you cannot swallow.  You cough when you drink liquids.  You cough up partially digested food. Get help right away if:  You cannot swallow your saliva.  You have shortness of breath, a fever, or both.  Your voice is hoarse and you have trouble swallowing. These symptoms may represent a serious problem that is an emergency. Do not wait to see if the symptoms will go away. Get medical help right away. Call your local emergency services (911 in the [place] Do not drive yourself to the hospital. Summary  Dysphagia is trouble swallowing. This condition occurs when solids and liquids stick in a person's throat on the way down to the stomach. You may cough or gag while trying to swallow.  Dysphagia has many possible causes.  Treatment for dysphagia depends on the cause of the condition.  Keep all follow-up visits. This is important. This information is not intended to replace advice given to you by your health care provider. Make sure you discuss any questions you have with your health care provider. Document Revised: 02/16/2020 Document Reviewed: 02/16/2020 Elsevier Patient Education  2021 Elsevier Inc.  

## 2020-12-08 DIAGNOSIS — R1310 Dysphagia, unspecified: Secondary | ICD-10-CM | POA: Insufficient documentation

## 2020-12-08 DIAGNOSIS — Z7689 Persons encountering health services in other specified circumstances: Secondary | ICD-10-CM | POA: Insufficient documentation

## 2021-01-06 ENCOUNTER — Other Ambulatory Visit: Payer: BC Managed Care – PPO

## 2021-01-08 ENCOUNTER — Encounter: Payer: BC Managed Care – PPO | Admitting: Nurse Practitioner

## 2021-04-02 ENCOUNTER — Encounter: Payer: Self-pay | Admitting: Nurse Practitioner

## 2021-04-20 ENCOUNTER — Encounter: Payer: Self-pay | Admitting: Nurse Practitioner

## 2021-04-20 ENCOUNTER — Ambulatory Visit (INDEPENDENT_AMBULATORY_CARE_PROVIDER_SITE_OTHER): Payer: BC Managed Care – PPO | Admitting: Nurse Practitioner

## 2021-04-20 VITALS — Ht 71.0 in | Wt 163.2 lb

## 2021-04-20 DIAGNOSIS — J069 Acute upper respiratory infection, unspecified: Secondary | ICD-10-CM | POA: Diagnosis not present

## 2021-04-20 NOTE — Progress Notes (Signed)
Virtual Visit via Telephone Note  I connected with Elijah Huang on 04/26/21 at  3:30 PM EDT by telephone and verified that I am speaking with the correct person using two identifiers.  Location: Patient: home Provider: [facility]     I discussed the limitations, risks, security and privacy concerns of performing an evaluation and management service by telephone and the availability of in person appointments. I also discussed with the patient that there may be a patient responsible charge related to this service. The patient expressed understanding and agreed to proceed.   History of Present Illness: The patient presents for acute visit.  Started with sore throat, nasal congestion, body aches, and low-grade fever this morning.  States that has gotten slightly better.  When blowing his nose, he is getting greenish, yellow mucus.  He denies nausea or vomiting.  Did have some diarrhea yesterday.  He did take a COVID-19 test today and it was negative.  States that prior to COVID-19 he did get multiple sinus infections.   Observations/Objective: The patient is alert and oriented. He is pleasant and answering all questions appropriately. Breathing is non-labored. He is in no acute distress.  The patient sounds nasally congested.  A loose, nonproductive cough can be heard during the telephone call.  Today's Vitals   04/20/21 1509  Weight: 163 lb 3.2 oz (74 kg)  Height: 5\' 11"  (1.803 m)   Body mass index is 22.76 kg/m.   Assessment and Plan: 1. Upper respiratory tract infection, unspecified type Patient with upper respiratory infection symptoms starting today.  Negative COVID test this morning.  Rest and increase fluids. Continue using OTC medication to control symptoms.  Recommend he add vitamins C, D, zinc, and elderberry to help strengthen immune system and fight off infection.  Encouraged him to continue to quarantine and isolate until symptoms improve and fever  resolves.  If symptoms are persistent or worsen over the next 48 to 72 hours, he should take another COVID-19 test and notify the office for further assistance.  He voiced understanding and agreement.  Follow Up Instructions:    I discussed the assessment and treatment plan with the patient. The patient was provided an opportunity to ask questions and all were answered. The patient agreed with the plan and demonstrated an understanding of the instructions.   The patient was advised to call back or seek an in-person evaluation if the symptoms worsen or if the condition fails to improve as anticipated.  I provided 10 minutes of non-face-to-face time during this encounter.   Ronnell Freshwater, NP   This note was dictated using Systems analyst. Rapid proofreading was performed to expedite the delivery of the information. Despite proofreading, phonetic errors will occur which are common with this voice recognition software. Please take this into consideration. If there are any concerns, please contact our office.

## 2021-04-21 ENCOUNTER — Other Ambulatory Visit: Payer: Self-pay | Admitting: Nurse Practitioner

## 2021-04-21 ENCOUNTER — Encounter: Payer: Self-pay | Admitting: Nurse Practitioner

## 2021-04-21 DIAGNOSIS — J0140 Acute pansinusitis, unspecified: Secondary | ICD-10-CM

## 2021-04-21 MED ORDER — AZITHROMYCIN 250 MG PO TABS: ORAL_TABLET | ORAL | 0 refills | Status: DC

## 2021-04-21 NOTE — Progress Notes (Signed)
Sent presxcriptin for z-pack. Take as directed for 5 days. He should continue using OTC medication to control symptoms. He should also take vitamins C, D, and zinc everyday to boost immune system and help fight infection. Zpack sent to [facility].

## 2021-06-02 ENCOUNTER — Ambulatory Visit (INDEPENDENT_AMBULATORY_CARE_PROVIDER_SITE_OTHER): Payer: BC Managed Care – PPO | Admitting: Nurse Practitioner

## 2021-06-02 ENCOUNTER — Encounter: Payer: Self-pay | Admitting: Nurse Practitioner

## 2021-06-02 ENCOUNTER — Other Ambulatory Visit: Payer: Self-pay

## 2021-06-02 VITALS — BP 107/68 | HR 72 | Temp 98.1°F | Ht 71.0 in | Wt 172.1 lb

## 2021-06-02 DIAGNOSIS — Z23 Encounter for immunization: Secondary | ICD-10-CM | POA: Diagnosis not present

## 2021-06-02 DIAGNOSIS — Z0001 Encounter for general adult medical examination with abnormal findings: Secondary | ICD-10-CM

## 2021-06-02 DIAGNOSIS — G40909 Epilepsy, unspecified, not intractable, without status epilepticus: Secondary | ICD-10-CM

## 2021-06-02 DIAGNOSIS — K219 Gastro-esophageal reflux disease without esophagitis: Secondary | ICD-10-CM

## 2021-06-02 MED ORDER — OMEPRAZOLE 40 MG PO CPDR
40.0000 mg | DELAYED_RELEASE_CAPSULE | ORAL | 3 refills | Status: DC
Start: 2021-06-03 — End: 2022-06-14

## 2021-06-02 NOTE — Progress Notes (Signed)
Established Patient Office Visit  Subjective:  Patient ID: Elijah Huang, male    DOB: 08/26/1980  Age: 40 y.o. MRN: [id number]  CC:  Chief Complaint  Patient presents with   Annual Exam     HPI [name] presents for health maintenance exam. He does have history of seizure disorder. Had been stable for some time. Has had some "disconnected" type sensations which last a ver yshort period of time. They resolve on their own. Goes back to see neurology in May. Will contact neurology for persistent and/or worsening symptoms.  He has no other concerns or complaints today.  Patient would like to get flu shot today.   Past Medical History:  Diagnosis Date   COVID-19 virus infection 09/2020   ED (erectile dysfunction) of non-organic origin 2014   thought psychogenic by urology [name])   GERD (gastroesophageal reflux disease)    History of chicken pox    Migraines    Seizure disorder ([place]) 2015   presumed partial seizures, started on depakote, rec no driving for 6 mo (10/979, Dr Addison Lank)    Past Surgical History:  Procedure Laterality Date   TONSILLECTOMY  1989    Family History  Problem Relation Age of Onset   Diabetes Father    Coronary artery disease Father 37       cabg 43, nonsmoker, obese   Cancer Father 56       prostate   Coronary artery disease Paternal Grandfather 88       deceased from same   Cancer Maternal Grandfather        pancreatic   Stroke Neg Hx     Social History   Socioeconomic History   Marital status: Married    Spouse name: Not on file   Number of children: 0   Years of education: 12   Highest education level: Not on file  Occupational History   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   Social Determinants of Health   Financial Resource Strain: Not on file  Food Insecurity: Not on file  Transportation Needs: Not on file  Physical Activity: Not on file  Stress: Not on file  Social Connections: Not on file  Intimate Partner Violence: Not on file    Outpatient Medications Prior to Visit  Medication Sig Dispense Refill   cetirizine (ZYRTEC) 10 MG tablet Take 10 mg by mouth daily as needed.      Cholecalciferol (VITAMIN D3) 1000 units CAPS Take 1 capsule (1,000 Units total) by mouth daily. 30 capsule    cyanocobalamin (V-R VITAMIN B-12) 500 MCG tablet Take 1 tablet (500 mcg total) by mouth every Monday, Wednesday, and Friday.     lamoTRIgine (LAMICTAL) 100 MG tablet Take 1 in the morning, take 2 at  night 270 tablet 4   azithromycin (ZITHROMAX) 250 MG tablet z-pack - take as directed for 5 days 6 tablet 0   omeprazole (PRILOSEC) 40 MG capsule Take 1 capsule (40 mg total) by mouth every Monday, Wednesday, and Friday. 40 capsule 3   No facility-administered medications prior to visit.    Allergies  Allergen Reactions   Keppra [Levetiracetam] Other (See Comments)    irritability    ROS Review of Systems  Constitutional:  Negative for activity change, chills, fatigue and fever.  HENT:  Negative for congestion, postnasal drip, rhinorrhea, sinus pressure, sinus pain, sneezing and sore throat.   Eyes: Negative.   Respiratory:  Negative for cough, shortness of breath and wheezing.   Cardiovascular:  Negative for chest pain and palpitations.  Gastrointestinal:  Negative for constipation, diarrhea, nausea and vomiting.   Endocrine: Negative for cold intolerance, heat intolerance, polydipsia and polyuria.  Genitourinary:  Negative for dysuria, frequency and urgency.  Musculoskeletal:  Negative for back pain and myalgias.  Skin:  Negative for rash.  Allergic/Immunologic: Negative for environmental allergies.  Neurological:  Positive for seizures. Negative for dizziness, weakness and headaches.  Psychiatric/Behavioral:  The patient is not nervous/anxious.      Objective:    Physical Exam Vitals and nursing note reviewed.  Constitutional:      Appearance: Normal appearance. He is well-developed.  HENT:     Head: Normocephalic and atraumatic.     Right Ear: Tympanic membrane, ear canal and external ear normal.     Left Ear: Tympanic membrane, ear canal and external ear normal.     Nose: Nose normal.     Mouth/Throat:     Mouth: Mucous membranes are moist.  Eyes:     Extraocular Movements: Extraocular movements intact.     Conjunctiva/sclera: Conjunctivae normal.     Pupils: Pupils are equal, round, and reactive to light.  Cardiovascular:     Rate and Rhythm: Normal rate and regular rhythm.     Pulses: Normal pulses.     Heart sounds: Normal heart sounds.  Pulmonary:     Effort: Pulmonary effort is normal.     Breath sounds: Normal breath sounds.  Abdominal:     General: Bowel sounds are normal.     Palpations: Abdomen is soft.     Tenderness: There is no abdominal tenderness.  Musculoskeletal:        General: Normal range of motion.     Cervical back: Normal range of motion and neck supple.  Lymphadenopathy:     Cervical: No cervical adenopathy.  Skin:    General: Skin is warm and dry.     Capillary Refill: Capillary refill takes less than 2 seconds.  Neurological:     General: No focal deficit present.     Mental Status: He is alert and oriented to person, place, and time.  Psychiatric:        Mood and Affect: Mood normal.        Behavior: Behavior normal.        Thought Content:  Thought content normal.        Judgment: Judgment normal.    Today's Vitals   06/02/21 1611  BP: 107/68  Pulse: 72  Temp: 98.1 F (36.7 C)  SpO2: 99%  Weight: 172 lb 1.6 oz (78.1 kg)  Height: _0  (1.803 m)   Body mass index is 24 kg/m.    Wt Readings from Last 3 Encounters:  06/02/21 172 lb 1.6 oz (78.1 kg)  04/20/21 163 lb 3.2 oz (74 kg)  11/25/20 163 lb 3.2 oz (74 kg)     Health Maintenance Due  Topic Date Due   Pneumococcal Vaccine 2-44 Years old (1 - PCV) Never done   COVID-19 Vaccine (3 - Pfizer risk series) 11/19/2019   TETANUS/TDAP  04/20/2021    There are no preventive care reminders to display for this patient.  Lab Results  Component Value Date   TSH 1.97 11/14/2019   Lab Results  Component Value Date   WBC 6.4 11/11/2020   HGB 14.4 11/11/2020   HCT 42.2 11/11/2020   MCV 84 11/11/2020   PLT 367 11/11/2020   Lab Results  Component Value Date   NA 143 11/11/2020   K 3.8 11/11/2020   CO2 24 11/11/2020   GLUCOSE 89 11/11/2020   BUN 18 11/11/2020   CREATININE 1.23 11/11/2020   BILITOT 0.5 11/11/2020   ALKPHOS 91 11/11/2020   AST 24 11/11/2020   ALT 20 11/11/2020   PROT 6.8 11/11/2020   ALBUMIN 4.7 11/11/2020   CALCIUM 9.7 11/11/2020   ANIONGAP 10 10/21/2014   EGFR 78 11/11/2020   GFR 58.03 (L) 11/14/2019   Lab Results  Component Value Date   CHOL 166 05/31/2018   Lab Results  Component Value Date   HDL 68.30 05/31/2018   Lab Results  Component Value Date   LDLCALC 73 05/31/2018   Lab Results  Component Value Date   TRIG 120.0 05/31/2018   Lab Results  Component Value Date   CHOLHDL 2 05/31/2018   No results found for: HGBA1C    Assessment & Plan:  1. Encounter for general adult medical examination with abnormal findings Annual wellness visit today   2. Gastroesophageal reflux disease without esophagitis May continue omeprazole every other day as previouly prescribed.  - omeprazole (PRILOSEC) 40 MG capsule; Take 1  capsule (40 mg total) by mouth every Monday, Wednesday, and Friday.  Dispense: 90 capsule; Refill: 3  3. Seizure disorder [facility]) Patient should continue regular visits with neurology as scheduled. Recommend he contact his neurologist if current neurological activity persists.   4. Need for influenza vaccination Flu vaccine administered during today's visit  - Flu Vaccine QUAD 6+ mos PF IM (Fluarix Quad PF)    Problem List Items Addressed This Visit       Digestive   GERD (gastroesophageal reflux disease)   Relevant Medications   omeprazole (PRILOSEC) 40 MG capsule     Nervous and Auditory   Seizure disorder ([place])   Other Visit Diagnoses     Encounter for general adult medical examination with abnormal findings    -  Primary   Need for influenza vaccination       Relevant Orders   Flu Vaccine QUAD 6+ mos PF IM (Fluarix Quad PF) (Completed)       Meds ordered this encounter  Medications   omeprazole (PRILOSEC) 40 MG capsule    Sig: Take 1 capsule (40 mg total) by mouth every Monday, Wednesday, and Friday.    Dispense:  90 capsule    Refill:  3    Order Specific Question:   Supervising Provider    Answer:   Beatrice Lecher D [[id number]]     Follow-up: Return in about 1 year (around 06/02/2022) for health maintenance exam - needs FBW and Tdap in 2 months. thanks .    Ronnell Freshwater, NP

## 2021-06-13 NOTE — Progress Notes (Signed)
Chart reviewed, agree above plan ?

## 2021-08-06 ENCOUNTER — Other Ambulatory Visit: Payer: BC Managed Care – PPO

## 2021-11-10 NOTE — Progress Notes (Signed)
? ? ?PATIENT: Elijah Huang ?DOB: 1980/11/16 ? ?REASON FOR VISIT: follow up for seizures, migraines ?HISTORY FROM: patient ?PRIMARY NEUROLOGIST: Dr. Krista Blue  ? ?HISTORY Elijah Huang is a 41 years old right-handed Caucasian male, accompanied by his girlfriend, referred by his primary care physician Dr. Danise Mina for evaluation of possible seizure, with his girl friend AprilHe had past medical history of GERD, erectile dysfunction ?He presented with seizure-like event, the initial episode was witness by his girlfriend, Dec 07 2013, after waking up talking with his girlfriend for a while, shortly afterwards, he was noticed to lie on his left side, body shaking, loss of consciousness, lasting for a few minutes, with tongue biting, lip numbness, next day, in May 30th 2015, while taking a shower, his girlfriend heard a loud thundering, he was sitting in the shower, confused, did not know what has happened, no self injury, ?He denied previous history of seizure, he presented to the emergency room Dec 09 2013, with constellation of complaints, intermittent chest pain, blurry vision, left eye pain, lightheadedness, ?He had a CAT scan of the brain that was normal, laboratory showed normal CBC, CMP, negative troponin, ?He works as a Physiological scientist ?UPDATE Oct 14th 2015:YY ?While taking titrating dose of Depakote he continued to have recurrent sudden onset of lost of consciousness, ?MRI of the brain was normal, EEG was normal ?June 3rd 2015,, spaced out while talking with his nieced, lasting 2 minutes, no seizure activity   ?June 10th 2015: at work, become unresponsive, ?Depakote ER was increased from 500 mg one tablet a day, to 2 tablets every night ?July 2nd 2015, in the car with his girl friend, spaced out, lasting 3-4 minues, staring, no body shaking,  July 3rd 2015: goes into staring, noot responside, when he comes too, his hand tremor. ?Sept 24th 2015:  At work, he became unresponsive to his manager, staring, hand  tremor, mild urinary incontinence He has no collection of hte events, no warning signs,   ?He noticed side effects on Depakote this includes weight gain, increased bilateral hands tremor , Depakote was stop in Nov 2015, started Keppra ?  ?UPDATE October 30 2014:He is doing well with keppra '1500mg'$  bid , Topamax '100mg'$  bid, no recurrent seizure, rarely has headache, Maxalt was helpful. ?  ?UPDATE 04/30/2015 CMMr. Elijah Huang, 41 year old male returns for follow-up he has been seen in the past by Dr. Krista Blue in our clinic . He has a history of generalized seizure disorder and migraine headaches. He is currently on Keppra 1500 mg twice daily and Topamax '100mg'$   twice daily. Last seizure was 05/22/2014. He continues to have an occasional headache which is usually relieved with Maxalt. He needs refills on Maxalt. He is not aware of any food triggers. He returns for reevaluation ?  ?UPDATE 07/24/2015. CMMr. Elijah Huang, 41 year old male returns for follow-up. He has a history of seizure disorder and migraines. His migraines are in excellent control on Topamax 100 twice daily and Maxalt acutely. His last seizure event occurred in November 2015. He was last seen in the office 10/6/ 2016. At that time he was doing well on Keppra however he called in to the office several weeks ago with increased irritability and tiredness on the drug. He returns today to talk about changing his seizure medications. He has previously been on Depakote with continued seizures. His previous seizure episodes are described as his body gets stiff with jerking of the extremities and loss of consciousness. These have been witnessed by his girlfriend in  the past. He returns for reevaluation ?UPDATE 04/19/2017CM Mr. Elijah Huang, 41 year old male returns for follow-up he was last seen in the office 07/24/2015. He has history of migraines that are in excellent control with Topamax also has a history of seizure disorder some increased irritability and tiredness on Keppra reported at last  visit. He was successfully switched to Vimpat and has tapered off of Keppra without difficulty. He claims he feels much better. He returns for reevaluation no further seizure activity. ?UPDATE 10/31/2017CM Mr. Elijah Huang, 41 year old male returns for follow-up. He has a history of seizure disorder with last seizure event occurring in November 2015. He is currently on Vimpat. He has history of migraines and is on Topamax 100 mg twice daily Maxalt acutely. His headaches are in excellent control and he rarely uses Maxalt. He just got tired by the [facility] Department. He returns for reevaluation ?  ?UPDATE 11/19/2018CM Mr. Elijah Huang, 41 year old male returns for follow-up with a history of seizure disorder.  Last seizure occurred in 2015.  In addition he has history of migraines.  He was on Vimpat and Topamax when last seen however his insurance changed and he could not afford the Vimpat so his Topamax was increased.  He is currently taking 300 mg total dose daily.  He is now working third shift, and complains of inability to go to sleep.  He is getting about 3-4 hours of sleep daily and his headaches and have increased since he has been on shift work.  His primary care has given him some information on good sleep habits and hygiene.  He was made aware that lack of sleep is not only a migraine trigger but a seizure trigger as well.  Maxalt works acutely.  He returns for reevaluation ?  ?Update October 11 2018 SS: Mr. Elijah Huang 41 year old male, history of seizures and migraines, taking Topamax 100 mg, 1 in the morning, 2 at bedtime.  He reports he may have had one episode of seizure last summer, but he is not sure it was a seizure.  He reports he was in the bed had just woken up, was drowsy, remembers feeling like he could not new, lasting for a few seconds.  He reports it subsided, he felt drowsy but was able to move.  He denies any shaking or incontinence, oral trauma.  He reports in the past this is been similar to his previous seizure  events.  He does report that he may have missed some of his Topamax dosage during that time.  He reports his initial seizure was in 2015, his most notable recent seizure was in 2017.  Reports his headaches have been under good control, has not been having to take Maxalt for several months.  He is currently working day shift, full-time, driving a car.  In the past he has been unable to tolerate Depakote or Keppra.  Previously he was on Topamax and Vimpat for seizures.  He stopped his Vimpat during a period of time when he lost his insurance and the medication was too expensive.  He denies any new problems or concerns. ?  ?Update November 08, 2019 SS: Here today for follow-up unaccompanied, he remains on Topamax 100 mg in the morning, 200 mg at bedtime.  He was previously on Topamax 100 mg twice a day, along with Vimpat 50 mg twice a day, but he lost his insurance, at that time he came off Vimpat, and Topamax was increased.  He has not had recurrent seizure, there was some  question of a possible seizure in summer 2019, possibly missing some dosages of Topamax.  His seizures are usually nocturnal, he is now living alone, no seizure that he knows of.  Historically, he knows he had seizure, he wakes up the next morning feeling very drowsy, feeling "different".  He works day shift at [employer].  Indicates his headaches have been under good control, he no longer takes Maxalt.  He is due for physical.  He does report general fatigue, he feels is related to the Topamax.  He denies significant snoring, after work, he may doze on the couch, go to bed early.  He equates this with occurring since he went on a higher dose of Topamax. ? ?Update January 07, 2020 SS: Here today to discuss medication adjustment, felt to have fatigue secondary to Topamax.  Currently taking Topamax 100 mg am/200 mg pm.  Recently saw PCP, no laboratory etiology for fatigue.  At one point was taking Topamax and then stopped due to loss of insurance,  even with insurance Vimpat was expensive.  Reports fatigue, mostly at the end of the day, does okay at work, feels over not much energy, sleeps well, not much snoring, no report of frequent morning headache.

## 2021-11-11 ENCOUNTER — Encounter: Payer: Self-pay | Admitting: Neurology

## 2021-11-11 ENCOUNTER — Ambulatory Visit: Payer: BC Managed Care – PPO | Admitting: Neurology

## 2021-11-11 VITALS — BP 118/79 | HR 93 | Ht 71.0 in | Wt 181.0 lb

## 2021-11-11 DIAGNOSIS — G43009 Migraine without aura, not intractable, without status migrainosus: Secondary | ICD-10-CM | POA: Diagnosis not present

## 2021-11-11 DIAGNOSIS — G40909 Epilepsy, unspecified, not intractable, without status epilepticus: Secondary | ICD-10-CM | POA: Diagnosis not present

## 2021-11-11 MED ORDER — LAMOTRIGINE 100 MG PO TABS: ORAL_TABLET | ORAL | 4 refills | Status: DC

## 2021-11-12 LAB — CBC WITH DIFFERENTIAL/PLATELET
Basophils Absolute: 0.1 10*3/uL (ref 0.0–0.2)
Basos: 1 %
EOS (ABSOLUTE): 0.1 10*3/uL (ref 0.0–0.4)
Eos: 2 %
Hematocrit: 46.1 % (ref 37.5–51.0)
Hemoglobin: 15.9 g/dL (ref 13.0–17.7)
Immature Grans (Abs): 0 10*3/uL (ref 0.0–0.1)
Immature Granulocytes: 0 %
Lymphocytes Absolute: 1.7 10*3/uL (ref 0.7–3.1)
Lymphs: 27 %
MCH: 29.3 pg (ref 26.6–33.0)
MCHC: 34.5 g/dL (ref 31.5–35.7)
MCV: 85 fL (ref 79–97)
Monocytes Absolute: 0.5 10*3/uL (ref 0.1–0.9)
Monocytes: 7 %
Neutrophils Absolute: 3.9 10*3/uL (ref 1.4–7.0)
Neutrophils: 63 %
Platelets: 344 10*3/uL (ref 150–450)
RBC: 5.43 x10E6/uL (ref 4.14–5.80)
RDW: 12.9 % (ref 11.6–15.4)
WBC: 6.2 10*3/uL (ref 3.4–10.8)

## 2021-11-12 LAB — COMPREHENSIVE METABOLIC PANEL
ALT: 17 IU/L (ref 0–44)
AST: 17 IU/L (ref 0–40)
Albumin/Globulin Ratio: 2.7 — ABNORMAL HIGH (ref 1.2–2.2)
Albumin: 5.3 g/dL — ABNORMAL HIGH (ref 4.0–5.0)
Alkaline Phosphatase: 84 IU/L (ref 44–121)
BUN/Creatinine Ratio: 11 (ref 9–20)
BUN: 13 mg/dL (ref 6–20)
Bilirubin Total: 1 mg/dL (ref 0.0–1.2)
CO2: 28 mmol/L (ref 20–29)
Calcium: 10 mg/dL (ref 8.7–10.2)
Chloride: 102 mmol/L (ref 96–106)
Creatinine, Ser: 1.18 mg/dL (ref 0.76–1.27)
Globulin, Total: 2 g/dL (ref 1.5–4.5)
Glucose: 84 mg/dL (ref 70–99)
Potassium: 4.2 mmol/L (ref 3.5–5.2)
Sodium: 143 mmol/L (ref 134–144)
Total Protein: 7.3 g/dL (ref 6.0–8.5)
eGFR: 81 mL/min/{1.73_m2} (ref >59)

## 2021-11-12 LAB — LAMOTRIGINE LEVEL: Lamotrigine Lvl: 5.7 ug/mL (ref 2.0–20.0)

## 2022-03-04 ENCOUNTER — Encounter: Payer: Self-pay | Admitting: Nurse Practitioner

## 2022-03-04 ENCOUNTER — Encounter: Payer: Self-pay | Admitting: Neurology

## 2022-04-12 NOTE — Progress Notes (Signed)
PATIENT: Elijah Huang DOB: 22-Jan-1981  REASON FOR VISIT: follow up for seizures, migraines HISTORY FROM: patient PRIMARY NEUROLOGIST: Dr. Krista Blue   HISTORY Elijah Huang is a 41 years old right-handed Caucasian male, accompanied by his girlfriend, referred by his primary care physician Dr. Danise Mina for evaluation of possible seizure, with his girl friend AprilHe had past medical history of GERD, erectile dysfunction He presented with seizure-like event, the initial episode was witness by his girlfriend, Dec 07 2013, after waking up talking with his girlfriend for a while, shortly afterwards, he was noticed to lie on his left side, body shaking, loss of consciousness, lasting for a few minutes, with tongue biting, lip numbness, next day, in May 30th 2015, while taking a shower, his girlfriend heard a loud thundering, he was sitting in the shower, confused, did not know what has happened, no self injury, He denied previous history of seizure, he presented to the emergency room Dec 09 2013, with constellation of complaints, intermittent chest pain, blurry vision, left eye pain, lightheadedness, He had a CAT scan of the brain that was normal, laboratory showed normal CBC, CMP, negative troponin, He works as a Physiological scientist UPDATE Oct 14th 2015:YY While taking titrating dose of Depakote he continued to have recurrent sudden onset of lost of consciousness, MRI of the brain was normal, EEG was normal June 3rd 2015,, spaced out while talking with his nieced, lasting 2 minutes, no seizure activity   June 10th 2015: at work, become unresponsive, Depakote ER was increased from 500 mg one tablet a day, to 2 tablets every night July 2nd 2015, in the car with his girl friend, spaced out, lasting 3-4 minues, staring, no body shaking,  July 3rd 2015: goes into staring, noot responside, when he comes too, his hand tremor. Sept 24th 2015:  At work, he became unresponsive to his manager, staring, hand tremor,  mild urinary incontinence He has no collection of hte events, no warning signs,   He noticed side effects on Depakote this includes weight gain, increased bilateral hands tremor , Depakote was stop in Nov 2015, started Keppra   UPDATE October 30 2014:He is doing well with keppra '1500mg'$  bid , Topamax '100mg'$  bid, no recurrent seizure, rarely has headache, Maxalt was helpful.   UPDATE 04/30/2015 CMMr. Huang, 41 year old male returns for follow-up he has been seen in the past by Dr. Krista Blue in our clinic . He has a history of generalized seizure disorder and migraine headaches. He is currently on Keppra 1500 mg twice daily and Topamax '100mg'$   twice daily. Last seizure was 05/22/2014. He continues to have an occasional headache which is usually relieved with Maxalt. He needs refills on Maxalt. He is not aware of any food triggers. He returns for reevaluation   UPDATE 07/24/2015. CMMr. Huang, 41 year old male returns for follow-up. He has a history of seizure disorder and migraines. His migraines are in excellent control on Topamax 100 twice daily and Maxalt acutely. His last seizure event occurred in November 2015. He was last seen in the office 10/6/ 2016. At that time he was doing well on Keppra however he called in to the office several weeks ago with increased irritability and tiredness on the drug. He returns today to talk about changing his seizure medications. He has previously been on Depakote with continued seizures. His previous seizure episodes are described as his body gets stiff with jerking of the extremities and loss of consciousness. These have been witnessed by his girlfriend in the  past. He returns for reevaluation UPDATE 04/19/2017CM Elijah Huang, 41 year old male returns for follow-up he was last seen in the office 07/24/2015. He has history of migraines that are in excellent control with Topamax also has a history of seizure disorder some increased irritability and tiredness on Keppra reported at last visit.  He was successfully switched to Vimpat and has tapered off of Keppra without difficulty. He claims he feels much better. He returns for reevaluation no further seizure activity. UPDATE 10/31/2017CM Elijah Huang, 41 year old male returns for follow-up. He has a history of seizure disorder with last seizure event occurring in November 2015. He is currently on Vimpat. He has history of migraines and is on Topamax 100 mg twice daily Maxalt acutely. His headaches are in excellent control and he rarely uses Maxalt. He just got tired by the [facility] Department. He returns for reevaluation   UPDATE 11/19/2018CM Elijah Huang, 41 year old male returns for follow-up with a history of seizure disorder.  Last seizure occurred in 2015.  In addition he has history of migraines.  He was on Vimpat and Topamax when last seen however his insurance changed and he could not afford the Vimpat so his Topamax was increased.  He is currently taking 300 mg total dose daily.  He is now working third shift, and complains of inability to go to sleep.  He is getting about 3-4 hours of sleep daily and his headaches and have increased since he has been on shift work.  His primary care has given him some information on good sleep habits and hygiene.  He was made aware that lack of sleep is not only a migraine trigger but a seizure trigger as well.  Maxalt works acutely.  He returns for reevaluation   Update October 11 2018 SS: Elijah Huang 41 year old male, history of seizures and migraines, taking Topamax 100 mg, 1 in the morning, 2 at bedtime.  He reports he may have had one episode of seizure last summer, but he is not sure it was a seizure.  He reports he was in the bed had just woken up, was drowsy, remembers feeling like he could not new, lasting for a few seconds.  He reports it subsided, he felt drowsy but was able to move.  He denies any shaking or incontinence, oral trauma.  He reports in the past this is been similar to his previous seizure events.   He does report that he may have missed some of his Topamax dosage during that time.  He reports his initial seizure was in 2015, his most notable recent seizure was in 2017.  Reports his headaches have been under good control, has not been having to take Maxalt for several months.  He is currently working day shift, full-time, driving a car.  In the past he has been unable to tolerate Depakote or Keppra.  Previously he was on Topamax and Vimpat for seizures.  He stopped his Vimpat during a period of time when he lost his insurance and the medication was too expensive.  He denies any new problems or concerns.   Update November 08, 2019 SS: Here today for follow-up unaccompanied, he remains on Topamax 100 mg in the morning, 200 mg at bedtime.  He was previously on Topamax 100 mg twice a day, along with Vimpat 50 mg twice a day, but he lost his insurance, at that time he came off Vimpat, and Topamax was increased.  He has not had recurrent seizure, there was some question  of a possible seizure in summer 2019, possibly missing some dosages of Topamax.  His seizures are usually nocturnal, he is now living alone, no seizure that he knows of.  Historically, he knows he had seizure, he wakes up the next morning feeling very drowsy, feeling "different".  He works day shift at [employer].  Indicates his headaches have been under good control, he no longer takes Maxalt.  He is due for physical.  He does report general fatigue, he feels is related to the Topamax.  He denies significant snoring, after work, he may doze on the couch, go to bed early.  He equates this with occurring since he went on a higher dose of Topamax.  Update January 07, 2020 SS: Here today to discuss medication adjustment, felt to have fatigue secondary to Topamax.  Currently taking Topamax 100 mg am/200 mg pm.  Recently saw PCP, no laboratory etiology for fatigue.  At one point was taking Topamax and then stopped due to loss of insurance, even with  insurance Vimpat was expensive.  Reports fatigue, mostly at the end of the day, does okay at work, feels over not much energy, sleeps well, not much snoring, no report of frequent morning headache.  Headaches remain well controlled, a few a month, responds well to Advil.  Update Nov 11, 2020 SS: Off Topamax due to to fatigue, has noted way less fatigue. On Lamictal 100 mg AM/200 mg PM. Only side effects: occasional vivid dreams, few times felt disconnected, but not like seizure. No seizures. Headaches are well controlled, will take occasional Advil. Work is going well, full time. No change to health.   Update Nov 11, 2021 SS: Remains on Lamictal 100/200 mg daily. Few episodes of shock like pains in his head when laying down no more than twice a week for last month or so, only few seconds, not clear triggers.  Denies any seizures or migraine headaches.  Update April 13, 2022 SS: Still reports shock like pains every few seconds to top of head a few nights a week when lying in bed, he holds his head, it goes away, passes no more than 5 minutes. A few nights while sitting on the couch. In past has had migraines. Tried new pillow, positioning.   REVIEW OF SYSTEMS: Out of a complete 14 system review of symptoms, the patient complains only of the following symptoms, and all other reviewed systems are negative.  See HPI  ALLERGIES: Allergies  Allergen Reactions   Keppra [Levetiracetam] Other (See Comments)    irritability    HOME MEDICATIONS: Outpatient Medications Prior to Visit  Medication Sig Dispense Refill   cetirizine (ZYRTEC) 10 MG tablet Take 10 mg by mouth daily as needed.      Cholecalciferol (VITAMIN D3) 1000 units CAPS Take 1 capsule (1,000 Units total) by mouth daily. 30 capsule    cyanocobalamin (V-R VITAMIN B-12) 500 MCG tablet Take 1 tablet (500 mcg total) by mouth every Monday, Wednesday, and Friday.     lamoTRIgine (LAMICTAL) 100 MG tablet Take 1 in the morning, take 2 at night 270  tablet 4   omeprazole (PRILOSEC) 40 MG capsule Take 1 capsule (40 mg total) by mouth every Monday, Wednesday, and Friday. 90 capsule 3   No facility-administered medications prior to visit.    PAST MEDICAL HISTORY: Past Medical History:  Diagnosis Date   COVID-19 virus infection 09/2020   ED (erectile dysfunction) of non-organic origin 2014   thought psychogenic by urology [name])  GERD (gastroesophageal reflux disease)    History of chicken pox    Migraines    Seizure disorder ([place]) 2015   presumed partial seizures, started on depakote, rec no driving for 6 mo (03/3715, Dr Addison Lank)    PAST SURGICAL HISTORY: Past Surgical History:  Procedure Laterality Date   TONSILLECTOMY  1989    FAMILY HISTORY: Family History  Problem Relation Age of Onset   Diabetes Father    Coronary artery disease Father 76       cabg 19, nonsmoker, obese   Cancer Father 17       prostate   Coronary artery disease Paternal Grandfather 46       deceased from same   Cancer Maternal Grandfather        pancreatic   Stroke Neg Hx     SOCIAL HISTORY: Social History   Socioeconomic History   Marital status: Married    Spouse name: Not on file   Number of children: 0   Years of education: 12   Highest education level: Not on file  Occupational History   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   Social Determinants of Health   Financial Resource Strain: Not on file  Food Insecurity: Not on file  Transportation Needs: Not on file  Physical Activity: Not on file  Stress: Not on file  Social Connections: Not on file  Intimate Partner Violence: Not on file   PHYSICAL EXAM  Vitals:   04/13/22 1516  BP: 116/75  Pulse: 78  Weight: 183 lb (83 kg)  Height: '5\' 11"'$  (1.803 m)     Body mass index is 25.52 kg/m.  Generalized: Well developed, in no acute distress   Neurological examination  Mentation: Alert oriented to time, place, history taking. Follows all commands speech and language fluent Cranial nerve II-XII: Pupils were equal round reactive to light. Extraocular movements were full, visual field were full on confrontational test. Facial sensation and strength were normal.  Head turning and shoulder shrug  were normal and symmetric. Motor: The motor testing reveals 5 over 5 strength of all 4 extremities. Good symmetric motor tone is noted throughout.  Sensory: Sensory testing is intact to soft touch on all 4 extremities. No evidence of extinction is noted.  Coordination: Cerebellar testing reveals good finger-nose-finger and heel-to-shin bilaterally.  Gait and station: Gait is normal.  Reflexes: Deep tendon reflexes are symmetric and normal bilaterally.   DIAGNOSTIC DATA (LABS, IMAGING, TESTING) - I reviewed patient records, labs, notes, testing and imaging myself where available.  Lab Results  Component Value Date   WBC 6.2 11/11/2021   HGB 15.9 11/11/2021   HCT 46.1 11/11/2021   MCV 85 11/11/2021   PLT 344 11/11/2021      Component Value  Date/Time   NA 143 11/11/2021 1550   K 4.2 11/11/2021 1550   CL 102 11/11/2021 1550   CO2 28 11/11/2021 1550   GLUCOSE 84 11/11/2021 1550   GLUCOSE 89 11/14/2019 1635   BUN 13 11/11/2021 1550   CREATININE 1.18 11/11/2021 1550   CREATININE 1.18 11/21/2015 1613   CALCIUM 10.0 11/11/2021 1550   PROT 7.3  11/11/2021 1550   ALBUMIN 5.3 (H) 11/11/2021 1550   AST 17 11/11/2021 1550   ALT 17 11/11/2021 1550   ALKPHOS 84 11/11/2021 1550   BILITOT 1.0 11/11/2021 1550   GFRNONAA 64 10/12/2018 1529   GFRAA 74 10/12/2018 1529   Lab Results  Component Value Date   CHOL 166 05/31/2018   HDL 68.30 05/31/2018   LDLCALC 73 05/31/2018   TRIG 120.0 05/31/2018   CHOLHDL 2 05/31/2018   No results found for: "HGBA1C" Lab Results  Component Value Date   TOIZTIWP80 998 11/14/2019   Lab Results  Component Value Date   TSH 1.97 11/14/2019   ASSESSMENT AND PLAN 41 y.o. year old male  has a past medical history of COVID-19 virus infection (09/2020), ED (erectile dysfunction) of non-organic origin (2014), GERD (gastroesophageal reflux disease), History of chicken pox, Migraines, and Seizure disorder ([place]) (2015). here with:  1.  Seizures, most recent seizure in November 2015 2.  Migraines 3.  New type of stabbing, electrical type pain to head throughout at night when lying in bed -Neuro exam is normal -unclear etiology of new headache type, seems positional, occipital neuralgia?  Migraine headache with new presentation? -Try gabapentin 300 mg at bedtime -Continue Lamictal for seizure prevention -If no improvement, consider MRI of the brain, we discussed doing this now, will see how he responds to gabapentin -In the past, was previously unable to tolerate Keppra or Depakote, Vimpat was too expensive; Topamax resulted in fatigue -Follow-up in 4 to 6 months or sooner if needed  Evangeline Dakin, DNP 04/13/2022, 3:40 PM [facility] [address], [place], [place] [address] [phone]

## 2022-04-13 ENCOUNTER — Ambulatory Visit (INDEPENDENT_AMBULATORY_CARE_PROVIDER_SITE_OTHER): Payer: BC Managed Care – PPO | Admitting: Neurology

## 2022-04-13 ENCOUNTER — Encounter: Payer: Self-pay | Admitting: Neurology

## 2022-04-13 VITALS — BP 116/75 | HR 78 | Ht 71.0 in | Wt 183.0 lb

## 2022-04-13 DIAGNOSIS — G40909 Epilepsy, unspecified, not intractable, without status epilepticus: Secondary | ICD-10-CM

## 2022-04-13 DIAGNOSIS — G43009 Migraine without aura, not intractable, without status migrainosus: Secondary | ICD-10-CM

## 2022-04-13 MED ORDER — GABAPENTIN 300 MG PO CAPS: 300.0000 mg | ORAL_CAPSULE | Freq: Every day | ORAL | 3 refills | Status: DC

## 2022-04-13 NOTE — Patient Instructions (Signed)
Try gabapentin 300 mg at bedtime for headache pain  If no better in a few weeks, let me know, can order MRI of the brain See you back in 4-6 months

## 2022-06-14 ENCOUNTER — Other Ambulatory Visit: Payer: Self-pay | Admitting: Nurse Practitioner

## 2022-06-14 DIAGNOSIS — K219 Gastro-esophageal reflux disease without esophagitis: Secondary | ICD-10-CM

## 2022-06-14 NOTE — Telephone Encounter (Signed)
Office visit required for refills.

## 2022-06-27 ENCOUNTER — Telehealth: Payer: BC Managed Care – PPO | Admitting: Family

## 2022-06-27 DIAGNOSIS — R6889 Other general symptoms and signs: Secondary | ICD-10-CM

## 2022-06-27 MED ORDER — OSELTAMIVIR PHOSPHATE 75 MG PO CAPS: 75.0000 mg | ORAL_CAPSULE | Freq: Two times a day (BID) | ORAL | 0 refills | Status: DC

## 2022-06-27 NOTE — Addendum Note (Signed)
Addended by: Evelina Dun A on: 06/27/2022 09:20 AM   Modules accepted: Orders

## 2022-06-27 NOTE — Progress Notes (Signed)
Prescription sent to pharmacy.

## 2022-06-27 NOTE — Progress Notes (Signed)

## 2022-08-17 ENCOUNTER — Encounter: Payer: Self-pay | Admitting: Nurse Practitioner

## 2022-08-17 ENCOUNTER — Ambulatory Visit: Payer: BC Managed Care – PPO | Admitting: Nurse Practitioner

## 2022-08-17 VITALS — BP 116/76 | HR 70 | Ht 71.0 in | Wt 180.4 lb

## 2022-08-17 DIAGNOSIS — D485 Neoplasm of uncertain behavior of skin: Secondary | ICD-10-CM | POA: Diagnosis not present

## 2022-08-17 DIAGNOSIS — K219 Gastro-esophageal reflux disease without esophagitis: Secondary | ICD-10-CM | POA: Diagnosis not present

## 2022-08-17 DIAGNOSIS — F411 Generalized anxiety disorder: Secondary | ICD-10-CM

## 2022-08-17 MED ORDER — ESCITALOPRAM OXALATE 10 MG PO TABS: 10.0000 mg | ORAL_TABLET | Freq: Every day | ORAL | 1 refills | Status: DC

## 2022-08-17 MED ORDER — OMEPRAZOLE 40 MG PO CPDR: 40.0000 mg | DELAYED_RELEASE_CAPSULE | Freq: Every day | ORAL | 0 refills | Status: DC

## 2022-08-17 NOTE — Progress Notes (Signed)
Established patient visit   Patient: Elijah Huang   DOB: November 07, 1980   42 y.o. Male  MRN: [id number] Visit Date: 08/17/2022   Chief Complaint  Patient presents with   Medication Refill   Subjective    HPI  Follow up -GERD --worsening over past few months.  --initially prescribed omeprazole 40 mg every Monday, Wednesday, and Friday.  -has noted certain foods which increase heartburn symptoms.   -has noted mole on right flank which has been growing and changing. Can feel lump under the mole.   Increased levels of tension -did teledoc appointment about this -prescribed lexapro 10 mg daily  --has been on this about 3 weeks or so.  -states that he has noted some improvement, but not that much -has not noted any negative side effects .   Medications: Outpatient Medications Prior to Visit  Medication Sig   cetirizine (ZYRTEC) 10 MG tablet Take 10 mg by mouth daily as needed.    Cholecalciferol (VITAMIN D3) 1000 units CAPS Take 1 capsule (1,000 Units total) by mouth daily.   cyanocobalamin (V-R VITAMIN B-12) 500 MCG tablet Take 1 tablet (500 mcg total) by mouth every Monday, Wednesday, and Friday.   [DISCONTINUED] escitalopram (LEXAPRO) 10 MG tablet Take by mouth.   [DISCONTINUED] gabapentin (NEURONTIN) 300 MG capsule Take 1 capsule (300 mg total) by mouth at bedtime.   [DISCONTINUED] lamoTRIgine (LAMICTAL) 100 MG tablet Take 1 in the morning, take 2 at night   [DISCONTINUED] omeprazole (PRILOSEC) 40 MG capsule TAKE 1 CAPSULE BY MOUTH EVERY MONDAY, WEDNESDAY, AND FRIDAY.   [DISCONTINUED] oseltamivir (TAMIFLU) 75 MG capsule Take 1 capsule (75 mg total) by mouth 2 (two) times daily.   No facility-administered medications prior to visit.    Review of Systems See HPI     Last CBC Lab Results  Component Value Date   WBC 6.2 11/11/2021   HGB 15.9 11/11/2021   HCT 46.1 11/11/2021   MCV 85 11/11/2021   MCH 29.3 11/11/2021   RDW 12.9 11/11/2021   PLT 344 [id number]   Last  metabolic panel Lab Results  Component Value Date   GLUCOSE 84 11/11/2021   NA 143 11/11/2021   K 4.2 11/11/2021   CL 102 11/11/2021   CO2 28 11/11/2021   BUN 13 11/11/2021   CREATININE 1.18 11/11/2021   EGFR 81 11/11/2021   CALCIUM 10.0 11/11/2021   PROT 7.3 11/11/2021   ALBUMIN 5.3 (H) 11/11/2021   LABGLOB 2.0 11/11/2021   AGRATIO 2.7 (H) 11/11/2021   BILITOT 1.0 11/11/2021   ALKPHOS 84 11/11/2021   AST 17 11/11/2021   ALT 17 11/11/2021   ANIONGAP 10 10/21/2014   Last lipids Lab Results  Component Value Date   CHOL 166 05/31/2018   HDL 68.30 05/31/2018   LDLCALC 73 05/31/2018   TRIG 120.0 05/31/2018   CHOLHDL 2 05/31/2018   Last hemoglobin A1c No results found for: "HGBA1C" Last thyroid functions Lab Results  Component Value Date   TSH 1.97 11/14/2019   Last vitamin D Lab Results  Component Value Date   VD25OH 42.00 11/14/2019       Objective     Today's Vitals   08/17/22 1519  BP: 116/76  Pulse: 70  SpO2: 98%  Weight: 180 lb 6.4 oz (81.8 kg)  Height: '5\' 11"'$  (1.803 m)   Body mass index is 25.16 kg/m.  BP Readings from Last 3 Encounters:  08/18/22 128/78  08/17/22 116/76  04/13/22 116/75    Wt Readings from  Last 3 Encounters:  08/18/22 182 lb (82.6 kg)  08/17/22 180 lb 6.4 oz (81.8 kg)  04/13/22 183 lb (83 kg)    Physical Exam Vitals and nursing note reviewed.  Constitutional:      Appearance: Normal appearance. He is well-developed.  HENT:     Head: Normocephalic and atraumatic.     Nose: Nose normal.  Eyes:     Extraocular Movements: Extraocular movements intact.     Conjunctiva/sclera: Conjunctivae normal.     Pupils: Pupils are equal, round, and reactive to light.  Cardiovascular:     Rate and Rhythm: Normal rate and regular rhythm.     Pulses: Normal pulses.     Heart sounds: Normal heart sounds.  Pulmonary:     Effort: Pulmonary effort is normal.     Breath sounds: Normal breath sounds.  Abdominal:     Palpations: Abdomen  is soft.  Musculoskeletal:        General: Normal range of motion.     Cervical back: Normal range of motion and neck supple.  Lymphadenopathy:     Cervical: No cervical adenopathy.  Skin:    General: Skin is warm and dry.     Capillary Refill: Capillary refill takes less than 2 seconds.     Comments: Dark brown mole on right flank  Palpable lump under mole. Non-tender.     Neurological:     General: No focal deficit present.     Mental Status: He is alert and oriented to person, place, and time.  Psychiatric:        Mood and Affect: Mood normal.        Behavior: Behavior normal.        Thought Content: Thought content normal.        Judgment: Judgment normal.      Assessment & Plan    1. Gastroesophageal reflux disease without esophagitis May take omeprazole 40 mg up to daily when needed. Recommend he limit exposure to trigger foods. May need to sleep with the head of his be raised to 30 degrees.  - omeprazole (PRILOSEC) 40 MG capsule; Take 1 capsule (40 mg total) by mouth daily.  Dispense: 90 capsule; Refill: 0  2. Generalized anxiety disorder Will have patient gradually increase lexapro to 20 mg daily. Reassess in approximately 6 weeks.  - escitalopram (LEXAPRO) 10 MG tablet; Take 1-2 tablets (10-20 mg total) by mouth daily.  Dispense: 60 tablet; Refill: 1  3. 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 Refer to dermatology for further evaluation and treatment. 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         Ronnell Freshwater, NP  [facility] [phone] (phone) [phone] (fax)  [place]

## 2022-08-18 ENCOUNTER — Ambulatory Visit (INDEPENDENT_AMBULATORY_CARE_PROVIDER_SITE_OTHER): Payer: BC Managed Care – PPO | Admitting: Neurology

## 2022-08-18 ENCOUNTER — Encounter: Payer: Self-pay | Admitting: Neurology

## 2022-08-18 VITALS — BP 128/78 | HR 73 | Ht 71.0 in | Wt 182.0 lb

## 2022-08-18 DIAGNOSIS — G43009 Migraine without aura, not intractable, without status migrainosus: Secondary | ICD-10-CM | POA: Diagnosis not present

## 2022-08-18 DIAGNOSIS — G40909 Epilepsy, unspecified, not intractable, without status epilepticus: Secondary | ICD-10-CM

## 2022-08-18 MED ORDER — GABAPENTIN 300 MG PO CAPS: 300.0000 mg | ORAL_CAPSULE | Freq: Every day | ORAL | 3 refills | Status: DC

## 2022-08-18 MED ORDER — LAMOTRIGINE 100 MG PO TABS: ORAL_TABLET | ORAL | 4 refills | Status: DC

## 2022-08-18 NOTE — Patient Instructions (Signed)
Return in 1 year  Continue current medications At next PCP visit, consider checking labs   Meds ordered this encounter  Medications   lamoTRIgine (LAMICTAL) 100 MG tablet    Sig: Take 1 in the morning, take 2 at night    Dispense:  270 tablet    Refill:  4   gabapentin (NEURONTIN) 300 MG capsule    Sig: Take 1 capsule (300 mg total) by mouth at bedtime.    Dispense:  9 capsule    Refill:  3

## 2022-08-18 NOTE — Progress Notes (Signed)
PATIENT: Elijah Huang DOB: 22-Jan-1981  REASON FOR VISIT: follow up for seizures, migraines HISTORY FROM: patient PRIMARY NEUROLOGIST: Dr. Krista Blue   HISTORY Elijah Huang is a 42 years old right-handed Caucasian male, accompanied by his girlfriend, referred by his primary care physician Dr. Danise Mina for evaluation of possible seizure, with his girl friend AprilHe had past medical history of GERD, erectile dysfunction He presented with seizure-like event, the initial episode was witness by his girlfriend, Dec 07 2013, after waking up talking with his girlfriend for a while, shortly afterwards, he was noticed to lie on his left side, body shaking, loss of consciousness, lasting for a few minutes, with tongue biting, lip numbness, next day, in May 30th 2015, while taking a shower, his girlfriend heard a loud thundering, he was sitting in the shower, confused, did not know what has happened, no self injury, He denied previous history of seizure, he presented to the emergency room Dec 09 2013, with constellation of complaints, intermittent chest pain, blurry vision, left eye pain, lightheadedness, He had a CAT scan of the brain that was normal, laboratory showed normal CBC, CMP, negative troponin, He works as a Physiological scientist UPDATE Oct 14th 2015:YY While taking titrating dose of Depakote he continued to have recurrent sudden onset of lost of consciousness, MRI of the brain was normal, EEG was normal June 3rd 2015,, spaced out while talking with his nieced, lasting 2 minutes, no seizure activity   June 10th 2015: at work, become unresponsive, Depakote ER was increased from 500 mg one tablet a day, to 2 tablets every night July 2nd 2015, in the car with his girl friend, spaced out, lasting 3-4 minues, staring, no body shaking,  July 3rd 2015: goes into staring, noot responside, when he comes too, his hand tremor. Sept 24th 2015:  At work, he became unresponsive to his manager, staring, hand tremor,  mild urinary incontinence He has no collection of hte events, no warning signs,   He noticed side effects on Depakote this includes weight gain, increased bilateral hands tremor , Depakote was stop in Nov 2015, started Keppra   UPDATE October 30 2014:He is doing well with keppra '1500mg'$  bid , Topamax '100mg'$  bid, no recurrent seizure, rarely has headache, Maxalt was helpful.   UPDATE 04/30/2015 CMMr. Huang, 42 year old male returns for follow-up he has been seen in the past by Dr. Krista Blue in our clinic . He has a history of generalized seizure disorder and migraine headaches. He is currently on Keppra 1500 mg twice daily and Topamax '100mg'$   twice daily. Last seizure was 05/22/2014. He continues to have an occasional headache which is usually relieved with Maxalt. He needs refills on Maxalt. He is not aware of any food triggers. He returns for reevaluation   UPDATE 07/24/2015. CMMr. Elijah Huang, 42 year old male returns for follow-up. He has a history of seizure disorder and migraines. His migraines are in excellent control on Topamax 100 twice daily and Maxalt acutely. His last seizure event occurred in November 2015. He was last seen in the office 10/6/ 2016. At that time he was doing well on Keppra however he called in to the office several weeks ago with increased irritability and tiredness on the drug. He returns today to talk about changing his seizure medications. He has previously been on Depakote with continued seizures. His previous seizure episodes are described as his body gets stiff with jerking of the extremities and loss of consciousness. These have been witnessed by his girlfriend in the  past. He returns for reevaluation UPDATE 04/19/2017CM Elijah Huang, 42 year old male returns for follow-up he was last seen in the office 07/24/2015. He has history of migraines that are in excellent control with Topamax also has a history of seizure disorder some increased irritability and tiredness on Keppra reported at last visit.  He was successfully switched to Vimpat and has tapered off of Keppra without difficulty. He claims he feels much better. He returns for reevaluation no further seizure activity. UPDATE 10/31/2017CM Elijah Huang, 42 year old male returns for follow-up. He has a history of seizure disorder with last seizure event occurring in November 2015. He is currently on Vimpat. He has history of migraines and is on Topamax 100 mg twice daily Maxalt acutely. His headaches are in excellent control and he rarely uses Maxalt. He just got tired by the [facility]. He returns for reevaluation   UPDATE 11/19/2018CM Elijah Huang, 42 year old male returns for follow-up with a history of seizure disorder.  Last seizure occurred in 2015.  In addition he has history of migraines.  He was on Vimpat and Topamax when last seen however his insurance changed and he could not afford the Vimpat so his Topamax was increased.  He is currently taking 300 mg total dose daily.  He is now working third shift, and complains of inability to go to sleep.  He is getting about 3-4 hours of sleep daily and his headaches and have increased since he has been on shift work.  His primary care has given him some information on good sleep habits and hygiene.  He was made aware that lack of sleep is not only a migraine trigger but a seizure trigger as well.  Maxalt works acutely.  He returns for reevaluation   Update October 11 2018 SS: Elijah Huang 42 year old male, history of seizures and migraines, taking Topamax 100 mg, 1 in the morning, 2 at bedtime.  He reports he may have had one episode of seizure last summer, but he is not sure it was a seizure.  He reports he was in the bed had just woken up, was drowsy, remembers feeling like he could not new, lasting for a few seconds.  He reports it subsided, he felt drowsy but was able to move.  He denies any shaking or incontinence, oral trauma.  He reports in the past this is been similar to his previous seizure events.   He does report that he may have missed some of his Topamax dosage during that time.  He reports his initial seizure was in 2015, his most notable recent seizure was in 2017.  Reports his headaches have been under good control, has not been having to take Maxalt for several months.  He is currently working day shift, full-time, driving a car.  In the past he has been unable to tolerate Depakote or Keppra.  Previously he was on Topamax and Vimpat for seizures.  He stopped his Vimpat during a period of time when he lost his insurance and the medication was too expensive.  He denies any new problems or concerns.   Update November 08, 2019 SS: Here today for follow-up unaccompanied, he remains on Topamax 100 mg in the morning, 200 mg at bedtime.  He was previously on Topamax 100 mg twice a day, along with Vimpat 50 mg twice a day, but he lost his insurance, at that time he came off Vimpat, and Topamax was increased.  He has not had recurrent seizure, there was some question  of a possible seizure in summer 2019, possibly missing some dosages of Topamax.  His seizures are usually nocturnal, he is now living alone, no seizure that he knows of.  Historically, he knows he had seizure, he wakes up the next morning feeling very drowsy, feeling "different".  He works day shift at [employer].  Indicates his headaches have been under good control, he no longer takes Maxalt.  He is due for physical.  He does report general fatigue, he feels is related to the Topamax.  He denies significant snoring, after work, he may doze on the couch, go to bed early.  He equates this with occurring since he went on a higher dose of Topamax.  Update January 07, 2020 SS: Here today to discuss medication adjustment, felt to have fatigue secondary to Topamax.  Currently taking Topamax 100 mg am/200 mg pm.  Recently saw PCP, no laboratory etiology for fatigue.  At one point was taking Topamax and then stopped due to loss of insurance, even with  insurance Vimpat was expensive.  Reports fatigue, mostly at the end of the day, does okay at work, feels over not much energy, sleeps well, not much snoring, no report of frequent morning headache.  Headaches remain well controlled, a few a month, responds well to Advil.  Update Nov 11, 2020 SS: Off Topamax due to to fatigue, has noted way less fatigue. On Lamictal 100 mg AM/200 mg PM. Only side effects: occasional vivid dreams, few times felt disconnected, but not like seizure. No seizures. Headaches are well controlled, will take occasional Advil. Work is going well, full time. No change to health.   Update Nov 11, 2021 SS: Remains on Lamictal 100/200 mg daily. Few episodes of shock like pains in his head when laying down no more than twice a week for last month or so, only few seconds, not clear triggers.  Denies any seizures or migraine headaches.  Update April 13, 2022 SS: Still reports shock like pains every few seconds to top of head a few nights a week when lying in bed, he holds his head, it goes away, passes no more than 5 minutes. A few nights while sitting on the couch. In past has had migraines. Tried new pillow, positioning.   Update July 18, 2022 SS: Doing well, taking gabapentin 300 mg at bedtime, no more shock like pains to the head, no seizures. Lamictal level was 5.7 in May 2023, CBC CMP unremarkable.  On Lamictal.  REVIEW OF SYSTEMS: Out of a complete 14 system review of symptoms, the patient complains only of the following symptoms, and all other reviewed systems are negative.  See HPI  ALLERGIES: Allergies  Allergen Reactions   Keppra [Levetiracetam] Other (See Comments)    irritability    HOME MEDICATIONS: Outpatient Medications Prior to Visit  Medication Sig Dispense Refill   cetirizine (ZYRTEC) 10 MG tablet Take 10 mg by mouth daily as needed.      Cholecalciferol (VITAMIN D3) 1000 units CAPS Take 1 capsule (1,000 Units total) by mouth daily. 30 capsule     cyanocobalamin (V-R VITAMIN B-12) 500 MCG tablet Take 1 tablet (500 mcg total) by mouth every Monday, Wednesday, and Friday.     escitalopram (LEXAPRO) 10 MG tablet Take 1-2 tablets (10-20 mg total) by mouth daily. 60 tablet 1   omeprazole (PRILOSEC) 40 MG capsule Take 1 capsule (40 mg total) by mouth daily. 90 capsule 0   gabapentin (NEURONTIN) 300 MG capsule Take 1 capsule (  300 mg total) by mouth at bedtime. 30 capsule 3   lamoTRIgine (LAMICTAL) 100 MG tablet Take 1 in the morning, take 2 at night 270 tablet 4   No facility-administered medications prior to visit.    PAST MEDICAL HISTORY: Past Medical History:  Diagnosis Date   COVID-19 virus infection 09/2020   ED (erectile dysfunction) of non-organic origin 2014   thought psychogenic by urology [name])   GERD (gastroesophageal reflux disease)    History of chicken pox    Migraines    Seizure disorder ([place]) 2015   presumed partial seizures, started on depakote, rec no driving for 6 mo (07/7913, Dr Addison Lank)    PAST SURGICAL HISTORY: Past Surgical History:  Procedure Laterality Date   TONSILLECTOMY  1989    FAMILY HISTORY: Family History  Problem Relation Age of Onset   Diabetes Father    Coronary artery disease Father 36       cabg 58, nonsmoker, obese   Cancer Father 57       prostate   Coronary artery disease Paternal Grandfather 50       deceased from same   Cancer Maternal Grandfather        pancreatic   Stroke Neg Hx     SOCIAL HISTORY: Social History   Socioeconomic History   Marital status: Married    Spouse name: Not on file   Number of children: 0   Years of education: 12   Highest education level: Not on file  Occupational History   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   Social Determinants of Health   Financial Resource Strain: Not on file  Food Insecurity: Not on file  Transportation Needs: Not on file  Physical Activity: Not on file  Stress: Not on file  Social Connections: Not on file  Intimate Partner Violence: Not on file   PHYSICAL EXAM  Vitals:   08/18/22 1514  BP: 128/78  Pulse: 73  Weight: 182 lb (82.6 kg)  Height: '5\' 11"'$  (1.803 m)   Body mass index is 25.38 kg/m.  Generalized: Well developed, in no acute distress   Neurological examination  Mentation: Alert oriented to time, place, history taking. Follows all commands speech and language fluent Cranial nerve II-XII: Pupils were equal round reactive to light. Extraocular movements were full, visual field were full on confrontational test. Facial sensation and strength were normal.  Head turning and shoulder shrug  were normal and symmetric. Motor: The motor testing reveals 5 over 5 strength of all 4 extremities. Good symmetric motor tone is noted throughout.  Sensory: Sensory testing is intact to soft touch on all 4 extremities. No evidence of extinction is noted.  Coordination: Cerebellar testing reveals good finger-nose-finger and heel-to-shin bilaterally.  Gait and station: Gait  is normal.  Reflexes: Deep tendon reflexes are symmetric and normal bilaterally.   DIAGNOSTIC DATA (LABS, IMAGING, TESTING) - I reviewed patient records, labs, notes, testing and imaging myself where available.  Lab Results  Component Value Date   WBC 6.2 11/11/2021   HGB 15.9 11/11/2021   HCT 46.1 11/11/2021   MCV  85 11/11/2021   PLT 344 11/11/2021      Component Value Date/Time   NA 143 11/11/2021 1550   K 4.2 11/11/2021 1550   CL 102 11/11/2021 1550   CO2 28 11/11/2021 1550   GLUCOSE 84 11/11/2021 1550   GLUCOSE 89 11/14/2019 1635   BUN 13 11/11/2021 1550   CREATININE 1.18 11/11/2021 1550   CREATININE 1.18 11/21/2015 1613   CALCIUM 10.0 11/11/2021 1550   PROT 7.3 11/11/2021 1550   ALBUMIN 5.3 (H) 11/11/2021 1550   AST 17 11/11/2021 1550   ALT 17 11/11/2021 1550   ALKPHOS 84 11/11/2021 1550   BILITOT 1.0 11/11/2021 1550   GFRNONAA 64 10/12/2018 1529   GFRAA 74 10/12/2018 1529   Lab Results  Component Value Date   CHOL 166 05/31/2018   HDL 68.30 05/31/2018   LDLCALC 73 05/31/2018   TRIG 120.0 05/31/2018   CHOLHDL 2 05/31/2018   No results found for: "HGBA1C" Lab Results  Component Value Date   FBPPHKFE76 147 11/14/2019   Lab Results  Component Value Date   TSH 1.97 11/14/2019   ASSESSMENT AND PLAN 42 y.o. year old male  has a past medical history of COVID-19 virus infection (09/2020), ED (erectile dysfunction) of non-organic origin (2014), GERD (gastroesophageal reflux disease), History of chicken pox, Migraines, and Seizure disorder ([place]) (2015). here with:  1.  Seizures, most recent seizure in November 2015 2.  Migraines  -He is doing well, symptoms under good control -Continue gabapentin 300 mg at bedtime, has been helpful for headaches -Continue Lamictal for seizure prevention-In the past, was previously unable to tolerate Keppra or Depakote, Vimpat was too expensive; Topamax resulted in fatigue -Follow-up in 1 year or sooner if needed, seeing PCP in 6 months, can consider recheck of routine labs  Evangeline Dakin, DNP 08/18/2022, 3:36 PM [facility] [address], [place], [place] [address] [phone]

## 2022-08-19 ENCOUNTER — Encounter: Payer: Self-pay | Admitting: Neurology

## 2022-08-23 ENCOUNTER — Other Ambulatory Visit: Payer: Self-pay

## 2022-08-23 MED ORDER — GABAPENTIN 300 MG PO CAPS: 300.0000 mg | ORAL_CAPSULE | Freq: Every day | ORAL | 3 refills | Status: DC

## 2022-08-23 NOTE — Progress Notes (Signed)
Rx corrected to reflect current dose.

## 2022-09-04 ENCOUNTER — Encounter: Payer: Self-pay | Admitting: Neurology

## 2022-09-06 ENCOUNTER — Other Ambulatory Visit: Payer: Self-pay

## 2022-09-12 DIAGNOSIS — D485 Neoplasm of uncertain behavior of skin: Secondary | ICD-10-CM | POA: Insufficient documentation

## 2022-09-12 DIAGNOSIS — F411 Generalized anxiety disorder: Secondary | ICD-10-CM | POA: Insufficient documentation

## 2022-09-17 ENCOUNTER — Encounter: Payer: Self-pay | Admitting: Neurology

## 2022-09-22 ENCOUNTER — Ambulatory Visit: Payer: BC Managed Care – PPO | Admitting: Dermatology

## 2022-09-28 ENCOUNTER — Encounter: Payer: Self-pay | Admitting: Nurse Practitioner

## 2022-09-28 ENCOUNTER — Ambulatory Visit: Payer: BC Managed Care – PPO | Admitting: Nurse Practitioner

## 2022-09-28 VITALS — BP 123/83 | HR 78 | Ht 71.0 in | Wt 181.4 lb

## 2022-09-28 DIAGNOSIS — K219 Gastro-esophageal reflux disease without esophagitis: Secondary | ICD-10-CM | POA: Diagnosis not present

## 2022-09-28 DIAGNOSIS — G40909 Epilepsy, unspecified, not intractable, without status epilepticus: Secondary | ICD-10-CM

## 2022-09-28 DIAGNOSIS — F411 Generalized anxiety disorder: Secondary | ICD-10-CM

## 2022-09-28 MED ORDER — BUSPIRONE HCL 10 MG PO TABS: ORAL_TABLET | ORAL | 2 refills | Status: DC

## 2022-09-28 MED ORDER — ESCITALOPRAM OXALATE 20 MG PO TABS: 20.0000 mg | ORAL_TABLET | Freq: Every day | ORAL | 1 refills | Status: DC

## 2022-09-28 NOTE — Progress Notes (Signed)
Established patient visit   Patient: Elijah Huang   DOB: 07-Sep-1980   42 y.o. Male  MRN: [id number] Visit Date: 09/28/2022   Chief Complaint  Patient presents with   Medical Management of Chronic Issues   Subjective    HPI  Follow up  -GERD. --increased prilosec 40 mg to daily from 3 days per week.  --states that this change has been helpful only needing to take 5 or 6 days per week. However, three days was not enough.  -Mood.  --now taking Lexapro 20 mg daily --has noted improvement.  -still getting irritable and losing his temper more often than he should.  -no negative side effects from increased lexapro dosing.   Medications: Outpatient Medications Prior to Visit  Medication Sig   cetirizine (ZYRTEC) 10 MG tablet Take 10 mg by mouth daily as needed.    Cholecalciferol (VITAMIN D3) 1000 units CAPS Take 1 capsule (1,000 Units total) by mouth daily.   cyanocobalamin (V-R VITAMIN B-12) 500 MCG tablet Take 1 tablet (500 mcg total) by mouth every Monday, Wednesday, and Friday.   gabapentin (NEURONTIN) 300 MG capsule Take 1 capsule (300 mg total) by mouth at bedtime.   lamoTRIgine (LAMICTAL) 100 MG tablet Take 1 in the morning, take 2 at night   omeprazole (PRILOSEC) 40 MG capsule Take 1 capsule (40 mg total) by mouth daily.   [DISCONTINUED] escitalopram (LEXAPRO) 10 MG tablet Take 1-2 tablets (10-20 mg total) by mouth daily.   No facility-administered medications prior to visit.    Review of Systems See HPI    Last CBC Lab Results  Component Value Date   WBC 6.2 11/11/2021   HGB 15.9 11/11/2021   HCT 46.1 11/11/2021   MCV 85 11/11/2021   MCH 29.3 11/11/2021   RDW 12.9 11/11/2021   PLT 344 11/11/2021   Last metabolic panel Lab Results  Component Value Date   GLUCOSE 84 11/11/2021   NA 143 11/11/2021   K 4.2 11/11/2021   CL 102 11/11/2021   CO2 28 11/11/2021   BUN 13 11/11/2021   CREATININE 1.18 11/11/2021   EGFR 81 11/11/2021   CALCIUM 10.0 11/11/2021    PROT 7.3 11/11/2021   ALBUMIN 5.3 (H) 11/11/2021   LABGLOB 2.0 11/11/2021   AGRATIO 2.7 (H) 11/11/2021   BILITOT 1.0 11/11/2021   ALKPHOS 84 11/11/2021   AST 17 11/11/2021   ALT 17 11/11/2021   ANIONGAP 10 10/21/2014   Last lipids Lab Results  Component Value Date   CHOL 166 05/31/2018   HDL 68.30 05/31/2018   LDLCALC 73 05/31/2018   TRIG 120.0 05/31/2018   CHOLHDL 2 05/31/2018    Last thyroid functions Lab Results  Component Value Date   TSH 1.97 11/14/2019   Last vitamin D Lab Results  Component Value Date   VD25OH 42.00 11/14/2019       Objective     Today's Vitals   09/28/22 1520  BP: 123/83  Pulse: 78  SpO2: 99%  Weight: 181 lb 6.4 oz (82.3 kg)  Height: 5\' 11"  (1.803 m)   Body mass index is 25.3 kg/m.  BP Readings from Last 3 Encounters:  09/28/22 123/83  08/18/22 128/78  08/17/22 116/76    Wt Readings from Last 3 Encounters:  09/28/22 181 lb 6.4 oz (82.3 kg)  08/18/22 182 lb (82.6 kg)  08/17/22 180 lb 6.4 oz (81.8 kg)    Physical Exam Vitals and nursing note reviewed.  Constitutional:      Appearance: Normal  appearance. He is well-developed.  HENT:     Head: Normocephalic and atraumatic.     Nose: Nose normal.     Mouth/Throat:     Mouth: Mucous membranes are moist.     Pharynx: Oropharynx is clear.  Eyes:     Extraocular Movements: Extraocular movements intact.     Conjunctiva/sclera: Conjunctivae normal.     Pupils: Pupils are equal, round, and reactive to light.  Neck:     Vascular: No carotid bruit.  Cardiovascular:     Rate and Rhythm: Normal rate and regular rhythm.     Pulses: Normal pulses.     Heart sounds: Normal heart sounds.  Pulmonary:     Effort: Pulmonary effort is normal.     Breath sounds: Normal breath sounds.  Abdominal:     Palpations: Abdomen is soft.  Musculoskeletal:        General: Normal range of motion.     Cervical back: Normal range of motion and neck supple.  Lymphadenopathy:     Cervical: No  cervical adenopathy.  Skin:    General: Skin is warm and dry.     Capillary Refill: Capillary refill takes less than 2 seconds.  Neurological:     General: No focal deficit present.     Mental Status: He is alert and oriented to person, place, and time.  Psychiatric:        Mood and Affect: Mood normal.        Behavior: Behavior normal.        Thought Content: Thought content normal.        Judgment: Judgment normal.      Assessment & Plan     Problem List Items Addressed This Visit       Digestive   GERD (gastroesophageal reflux disease)     Nervous and Auditory   Seizure disorder (HCC)     Other   Generalized anxiety disorder - Primary   Relevant Medications   busPIRone (BUSPAR) 10 MG tablet   escitalopram (LEXAPRO) 20 MG tablet     Return in about 4 weeks (around 10/26/2022) for mood. dded buspirone .         Carlean Jews, NP  [facility] at [facility] [phone] (phone) [phone] (fax)  [facility]

## 2022-11-02 ENCOUNTER — Ambulatory Visit: Payer: BC Managed Care – PPO | Admitting: Nurse Practitioner

## 2022-11-06 NOTE — Assessment & Plan Note (Signed)
Improved, though still having intermittent episodes of irritability. Add Buspirone 10 mg tablets. This may be taken up to twice daily as needed for acute  anxiety/irritability. Reassess in 4 weeks.

## 2022-11-06 NOTE — Assessment & Plan Note (Signed)
Improved. Continue omeprazole as prescribed.

## 2022-11-06 NOTE — Assessment & Plan Note (Signed)
Continue lamictal daily. Follow up with neurology as scheduled.

## 2022-11-11 ENCOUNTER — Encounter: Payer: Self-pay | Admitting: Physician Assistant

## 2022-11-11 ENCOUNTER — Ambulatory Visit: Payer: BC Managed Care – PPO | Admitting: Physician Assistant

## 2022-11-11 VITALS — BP 125/89 | HR 87 | Ht 71.0 in | Wt 184.7 lb

## 2022-11-11 DIAGNOSIS — G40909 Epilepsy, unspecified, not intractable, without status epilepticus: Secondary | ICD-10-CM

## 2022-11-11 DIAGNOSIS — Z0000 Encounter for general adult medical examination without abnormal findings: Secondary | ICD-10-CM | POA: Diagnosis not present

## 2022-11-11 DIAGNOSIS — G43009 Migraine without aura, not intractable, without status migrainosus: Secondary | ICD-10-CM | POA: Diagnosis not present

## 2022-11-11 DIAGNOSIS — K219 Gastro-esophageal reflux disease without esophagitis: Secondary | ICD-10-CM

## 2022-11-11 DIAGNOSIS — M7989 Other specified soft tissue disorders: Secondary | ICD-10-CM

## 2022-11-11 DIAGNOSIS — F411 Generalized anxiety disorder: Secondary | ICD-10-CM | POA: Diagnosis not present

## 2022-11-11 DIAGNOSIS — Z23 Encounter for immunization: Secondary | ICD-10-CM | POA: Diagnosis not present

## 2022-11-11 NOTE — Assessment & Plan Note (Signed)
Managed with omeprazole 40 mg 5 times a week  Advised diet changes

## 2022-11-11 NOTE — Progress Notes (Signed)
I,Elijah Huang,acting as a Neurosurgeon for [employer], PA-C.,have documented all relevant documentation on the behalf of Elijah Ferguson, PA-C,as directed by  Elijah Ferguson, PA-C while in the presence of Elijah Ferguson, PA-C.   New patient visit   Patient: Elijah Huang   DOB: 1980-09-13   42 y.o. Male  MRN: [id number] Visit Date: 11/11/2022  Today's healthcare provider: Alfredia Ferguson, PA-C   Cc. Cpe, new pt   Subjective    Elijah Huang is a 42 y.o. male who presents today as a new patient to establish care.  HPI  -Tetanus Vaccine: yes  Well Adult Physical: Patient here for a comprehensive physical exam.The patient reports no problems Do you take any herbs or supplements that were not prescribed by a doctor? no Are you taking calcium supplements? no Are you taking aspirin daily? no  Anxiety --Feels well controlled.   Gerd -Controlled with omeprazole 5 times a week   Pt noticed a small lump underneath one of his moles on his back. Reports unchanged. He has a derm appt but not until Dec.  Past Medical History:  Diagnosis Date   Anxiety Febuary 2024   COVID-19 virus infection 09/2020   ED (erectile dysfunction) of non-organic origin 2014   thought psychogenic by urology Elijah Huang)   GERD (gastroesophageal reflux disease)    History of chicken pox    Migraines    Seizure disorder (HCC) 2015   presumed partial seizures, started on depakote, rec no driving for 6 mo (10/979, Dr Milinda Pointer)   Past Surgical History:  Procedure Laterality Date   TONSILLECTOMY  1989   Family Status  Relation Name Status   Father Elijah Huang Alive       56   PGF Elijah Huang Deceased at age 79       from MI   Mother  Alive       L & W   Sister  Alive       L & W   MGF Elijah Huang (Not Specified)   Pat Uncle Elijah Huang II (Not Specified)   Elijah Huang (Not Specified)   Neg Hx  (Not Specified)   Family History  Problem Relation Age of Onset   Diabetes Father    Coronary  artery disease Father 2       cabg 59, nonsmoker, obese   Cancer Father 33       prostate   Heart disease Father    Coronary artery disease Paternal Grandfather 78       deceased from same   Heart disease Paternal Grandfather    Cancer Maternal Grandfather        pancreatic   Heart disease Paternal Uncle    Heart disease Paternal Uncle    Stroke Neg Hx    Social History   Socioeconomic History   Marital status: Married    Spouse name: Not on file   Number of children: 0   Years of education: 12   Highest education level: Not on file  Occupational History   Occupation: Elijah Huang--works in mailroom  Tobacco Use   Smoking status: Some Days   Smokeless tobacco: Former    Types: Chew   Tobacco comments:    Chewing tobacco  Substance and Sexual Activity   Alcohol use: Yes    Alcohol/week: 7.0 standard drinks of alcohol    Types: 5 Cans of beer, 2 Standard drinks or equivalent per week    Comment: occasional  Drug use: No   Sexual activity: Yes    Partners: Female    Birth control/protection: None  Other Topics Concern   Not on file  Social History Narrative   Caffeine: 1-2 soft drinks/day   Divorced    Lives with his girlfriend  1 dog   Occupation: works at [employer]   Activity: no regular activity   Diet: good amt water, lots of fried foods, few fruits/vegetables.   Social Determinants of Health   Financial Resource Strain: Not on file  Food Insecurity: Not on file  Transportation Needs: Not on file  Physical Activity: Not on file  Stress: Not on file  Social Connections: Not on file   Outpatient Medications Prior to Visit  Medication Sig   busPIRone (BUSPAR) 10 MG tablet Take 1/2 to 1 tablet po BID prn anxiety   cetirizine (ZYRTEC) 10 MG tablet Take 10 mg by mouth daily as needed.    Cholecalciferol (VITAMIN D3) 1000 units CAPS Take 1 capsule (1,000 Units total) by mouth daily.   cyanocobalamin (V-R VITAMIN B-12) 500 MCG tablet Take 1 tablet  (500 mcg total) by mouth every Monday, Wednesday, and Friday.   escitalopram (LEXAPRO) 20 MG tablet Take 1 tablet (20 mg total) by mouth daily.   gabapentin (NEURONTIN) 300 MG capsule Take 1 capsule (300 mg total) by mouth at bedtime.   lamoTRIgine (LAMICTAL) 100 MG tablet Take 1 in the morning, take 2 at night   omeprazole (PRILOSEC) 40 MG capsule Take 1 capsule (40 mg total) by mouth daily.   No facility-administered medications prior to visit.   Allergies  Allergen Reactions   Keppra [Levetiracetam] Other (See Comments)    irritability    Immunization History  Administered Date(s) Administered   Influenza Split 04/21/2011   Influenza,inj,Quad PF,6+ Mos 05/26/2018, 03/27/2019, 06/02/2021   Influenza-Unspecified 04/11/2014, 04/26/2017   PFIZER(Purple Top)SARS-COV-2 Vaccination 09/27/2019, 10/22/2019   Td 11/11/2022   Tdap 04/21/2011    Health Maintenance  Topic Date Due   COVID-19 Vaccine (3 - Pfizer risk series) 11/19/2019   INFLUENZA VACCINE  02/10/2023   DTaP/Tdap/Td (3 - Td or Tdap) 11/10/2032   Hepatitis C Screening  Completed   HIV Screening  Completed   HPV VACCINES  Aged Out    Patient Care Team: Elijah Ferguson, PA-C as PCP - General (Physician Assistant)  Review of Systems  Constitutional:  Positive for appetite change.  Endocrine: Positive for polyphagia.  Genitourinary:  Positive for frequency.  Psychiatric/Behavioral:  The patient is nervous/anxious.     Objective    BP 125/89 (BP Location: Left Arm, Patient Position: Sitting, Cuff Size: Normal)   Pulse 87   Ht 5\' 11"  (1.803 m)   Wt 184 lb 11.2 oz (83.8 kg)   SpO2 97%   BMI 25.76 kg/m   Physical Exam Constitutional:      General: He is awake.     Appearance: He is well-developed.  HENT:     Head: Normocephalic.     Right Ear: Tympanic membrane, ear canal and external ear normal.     Left Ear: Tympanic membrane, ear canal and external ear normal.     Nose: Nose normal. No congestion or  rhinorrhea.     Mouth/Throat:     Mouth: Mucous membranes are moist.     Pharynx: No oropharyngeal exudate or posterior oropharyngeal erythema.  Eyes:     Pupils: Pupils are equal, round, and reactive to light.  Cardiovascular:     Rate and Rhythm:  Normal rate and regular rhythm.     Heart sounds: Normal heart sounds.  Pulmonary:     Effort: Pulmonary effort is normal.     Breath sounds: Normal breath sounds.  Abdominal:     General: There is no distension.     Palpations: Abdomen is soft.     Tenderness: There is no abdominal tenderness. There is no guarding.  Musculoskeletal:     Cervical back: Normal range of motion.     Right lower leg: No edema.     Left lower leg: No edema.  Lymphadenopathy:     Cervical: No cervical adenopathy.  Skin:    General: Skin is warm.     Comments: Small, mobile < 1 cm mass right flank underneath 2-3 cm mole. Mole appears well defined, no variation in color  Neurological:     Mental Status: He is alert and oriented to person, place, and time.  Psychiatric:        Attention and Perception: Attention normal.        Mood and Affect: Mood normal.        Speech: Speech normal.        Behavior: Behavior normal. Behavior is cooperative.    Depression Screen    11/11/2022    3:33 PM 09/28/2022    3:22 PM 08/17/2022    3:21 PM 06/02/2021    4:16 PM  PHQ 2/9 Scores  PHQ - 2 Score 1 2 0 0  PHQ- 9 Score 6 2 0 1   No results found for any visits on 11/11/22.  Assessment & Plan      Problem List Items Addressed This Visit       Cardiovascular and Mediastinum   Migraine without aura and without status migrainosus, not intractable - Primary   Relevant Orders   TSH + free T4     Digestive   GERD (gastroesophageal reflux disease)     Nervous and Auditory   Seizure disorder (HCC)   Relevant Orders   Vitamin B12   Lamotrigine level   TSH + free T4     Other   Generalized anxiety disorder    Manages with lexapro 20 mg and buspar 10 mg bid.   No therapist, discussion      Other Visit Diagnoses     Annual physical exam       Relevant Orders   Comprehensive metabolic panel   CBC with Differential/Platelet   Lipid panel   Need for prophylactic vaccination against diphtheria and tetanus       Relevant Orders   Td : Tetanus/diphtheria >7yo Preservative  free (Completed)   Soft tissue mass       Relevant Orders   US Pelvis Limited      7. Soft tissue mass Likely lipoma; underneath mole. Given how far away derm appt is, will order ultrasound to confirm.  - US Pelvis Limited; Future  Return pending labs, may just need annual cpe.    I, Elijah Ferguson, PA-C have reviewed all documentation for this visit. The documentation on  11/11/22   for the exam, diagnosis, procedures, and orders are all accurate and complete.  Elijah Ferguson, PA-C [facility] [address] [place], [place], [address] Office: [phone] Fax: [phone]   [facility]

## 2022-11-11 NOTE — Assessment & Plan Note (Addendum)
Manages with lexapro 20 mg and buspar 10 mg bid.  No therapist, discussion on starting, pt will think about it

## 2022-11-11 NOTE — Assessment & Plan Note (Signed)
F/b neurology ?

## 2022-11-11 NOTE — Assessment & Plan Note (Signed)
F/b neurology Will check lamictal level looks like neuro checks annually

## 2022-11-12 LAB — COMPREHENSIVE METABOLIC PANEL
ALT: 18 IU/L (ref 0–44)
AST: 17 IU/L (ref 0–40)
Albumin/Globulin Ratio: 2.6 — ABNORMAL HIGH (ref 1.2–2.2)
Albumin: 5 g/dL (ref 4.1–5.1)
Alkaline Phosphatase: 93 IU/L (ref 44–121)
BUN/Creatinine Ratio: 15 (ref 9–20)
BUN: 18 mg/dL (ref 6–20)
Bilirubin Total: 0.6 mg/dL (ref 0.0–1.2)
CO2: 24 mmol/L (ref 20–29)
Calcium: 10.1 mg/dL (ref 8.7–10.2)
Chloride: 101 mmol/L (ref 96–106)
Creatinine, Ser: 1.24 mg/dL (ref 0.76–1.27)
Globulin, Total: 1.9 g/dL (ref 1.5–4.5)
Glucose: 91 mg/dL (ref 70–99)
Potassium: 3.9 mmol/L (ref 3.5–5.2)
Sodium: 142 mmol/L (ref 134–144)
Total Protein: 6.9 g/dL (ref 6.0–8.5)
eGFR: 76 mL/min/{1.73_m2} (ref >59)

## 2022-11-12 LAB — CBC WITH DIFFERENTIAL/PLATELET
Basophils Absolute: 0 10*3/uL (ref 0.0–0.2)
Basos: 1 %
EOS (ABSOLUTE): 0.3 10*3/uL (ref 0.0–0.4)
Eos: 4 %
Hematocrit: 43.8 % (ref 37.5–51.0)
Hemoglobin: 14.9 g/dL (ref 13.0–17.7)
Immature Grans (Abs): 0 10*3/uL (ref 0.0–0.1)
Immature Granulocytes: 0 %
Lymphocytes Absolute: 2 10*3/uL (ref 0.7–3.1)
Lymphs: 32 %
MCH: 28.7 pg (ref 26.6–33.0)
MCHC: 34 g/dL (ref 31.5–35.7)
MCV: 84 fL (ref 79–97)
Monocytes Absolute: 0.5 10*3/uL (ref 0.1–0.9)
Monocytes: 8 %
Neutrophils Absolute: 3.4 10*3/uL (ref 1.4–7.0)
Neutrophils: 55 %
Platelets: 321 10*3/uL (ref 150–450)
RBC: 5.19 x10E6/uL (ref 4.14–5.80)
RDW: 12.9 % (ref 11.6–15.4)
WBC: 6.1 10*3/uL (ref 3.4–10.8)

## 2022-11-12 LAB — VITAMIN B12: Vitamin B-12: 644 pg/mL (ref 232–1245)

## 2022-11-12 LAB — TSH+FREE T4
Free T4: 1.18 ng/dL (ref 0.82–1.77)
TSH: 1.65 u[IU]/mL (ref 0.450–4.500)

## 2022-11-12 LAB — LIPID PANEL
Chol/HDL Ratio: 3 ratio (ref 0.0–5.0)
Cholesterol, Total: 184 mg/dL (ref 100–199)
HDL: 61 mg/dL (ref >39)
LDL Chol Calc (NIH): 100 mg/dL — ABNORMAL HIGH (ref 0–99)
Triglycerides: 131 mg/dL (ref 0–149)
VLDL Cholesterol Cal: 23 mg/dL (ref 5–40)

## 2022-11-12 LAB — LAMOTRIGINE LEVEL: Lamotrigine Lvl: 6 ug/mL (ref 2.0–20.0)

## 2022-11-16 ENCOUNTER — Ambulatory Visit
Admission: RE | Admit: 2022-11-16 | Discharge: 2022-11-16 | Disposition: A | Discharge status: Home / Self Care | Payer: BC Managed Care – PPO | Source: Ambulatory Visit | Attending: Physician Assistant | Admitting: Physician Assistant

## 2022-11-16 ENCOUNTER — Ambulatory Visit: Payer: BC Managed Care – PPO | Admitting: Neurology

## 2022-11-16 ENCOUNTER — Other Ambulatory Visit: Payer: Self-pay | Admitting: Physician Assistant

## 2022-11-16 DIAGNOSIS — M7989 Other specified soft tissue disorders: Secondary | ICD-10-CM

## 2022-11-16 DIAGNOSIS — K219 Gastro-esophageal reflux disease without esophagitis: Secondary | ICD-10-CM

## 2022-11-16 DIAGNOSIS — G43009 Migraine without aura, not intractable, without status migrainosus: Secondary | ICD-10-CM

## 2022-11-16 DIAGNOSIS — R1900 Intra-abdominal and pelvic swelling, mass and lump, unspecified site: Secondary | ICD-10-CM | POA: Diagnosis not present

## 2022-11-16 DIAGNOSIS — Z0000 Encounter for general adult medical examination without abnormal findings: Secondary | ICD-10-CM

## 2022-11-16 DIAGNOSIS — Z23 Encounter for immunization: Secondary | ICD-10-CM

## 2022-11-16 DIAGNOSIS — F411 Generalized anxiety disorder: Secondary | ICD-10-CM

## 2022-11-16 DIAGNOSIS — G40909 Epilepsy, unspecified, not intractable, without status epilepticus: Secondary | ICD-10-CM

## 2022-11-20 ENCOUNTER — Other Ambulatory Visit: Payer: Self-pay | Admitting: Nurse Practitioner

## 2022-11-20 DIAGNOSIS — K219 Gastro-esophageal reflux disease without esophagitis: Secondary | ICD-10-CM

## 2022-12-07 ENCOUNTER — Encounter: Payer: Self-pay | Admitting: Physician Assistant

## 2022-12-07 ENCOUNTER — Telehealth: Payer: BC Managed Care – PPO | Admitting: Physician Assistant

## 2022-12-07 DIAGNOSIS — J069 Acute upper respiratory infection, unspecified: Secondary | ICD-10-CM

## 2022-12-07 NOTE — Progress Notes (Signed)
E-Visit for Upper Respiratory Infection  ? ?We are sorry you are not feeling well.  Here is how we plan to help! ? ?Based on what you have shared with me, it looks like you may have a viral upper respiratory infection.  Upper respiratory infections are caused by a large number of viruses; however, rhinovirus is the most common cause.  ? ?Symptoms vary from person to person, with common symptoms including sore throat, cough, fatigue or lack of energy and feeling of general discomfort.  A low-grade fever of up to 100.4 may present, but is often uncommon.  Symptoms vary however, and are closely related to a person's age or underlying illnesses.  The most common symptoms associated with an upper respiratory infection are nasal discharge or congestion, cough, sneezing, headache and pressure in the ears and face.  These symptoms usually persist for about 3 to 10 days, but can last up to 2 weeks.  It is important to know that upper respiratory infections do not cause serious illness or complications in most cases.   ? ?Upper respiratory infections can be transmitted from person to person, with the most common method of transmission being a person's hands.  The virus is able to live on the skin and can infect other persons for up to 2 hours after direct contact.  Also, these can be transmitted when someone coughs or sneezes; thus, it is important to cover the mouth to reduce this risk.  To keep the spread of the illness at bay, good hand hygiene is very important. ? ?This is an infection that is most likely caused by a virus. There are no specific treatments other than to help you with the symptoms until the infection runs its course.  We are sorry you are not feeling well.  Here is how we plan to help! ? ? ?For nasal congestion, you may use an oral decongestants such as Mucinex D or if you have glaucoma or high blood pressure use plain Mucinex.  Saline nasal spray or nasal drops can help and can safely be used as often as  needed for congestion.  ? ?If you do not have a history of heart disease, hypertension, diabetes or thyroid disease, prostate/bladder issues or glaucoma, you may also use Sudafed to treat nasal congestion.  It is highly recommended that you consult with a pharmacist or your primary care physician to ensure this medication is safe for you to take.    ? ?If you have a cough, you may use cough suppressants such as Delsym and Robitussin.  If you have glaucoma or high blood pressure, you can also use Coricidin HBP.   ? ? ?If you have a sore or scratchy throat, use a saltwater gargle- ? to ? teaspoon of salt dissolved in a 4-ounce to 8-ounce glass of warm water.  Gargle the solution for approximately 15-30 seconds and then spit.  It is important not to swallow the solution.  You can also use throat lozenges/cough drops and Chloraseptic spray to help with throat pain or discomfort.  Warm or cold liquids can also be helpful in relieving throat pain. ? ?For headache, pain or general discomfort, you can use Ibuprofen or Tylenol as directed.   ?Some authorities believe that zinc sprays or the use of Echinacea may shorten the course of your symptoms. ? ? ?HOME CARE ?Only take medications as instructed by your medical team. ?Be sure to drink plenty of fluids. Water is fine as well as fruit juices, sodas   and electrolyte beverages. You may want to stay away from caffeine or alcohol. If you are nauseated, try taking small sips of liquids. How do you know if you are getting enough fluid? Your urine should be a pale yellow or almost colorless. ?Get rest. ?Taking a steamy shower or using a humidifier may help nasal congestion and ease sore throat pain. You can place a towel over your head and breathe in the steam from hot water coming from a faucet. ?Using a saline nasal spray works much the same way. ?Cough drops, hard candies and sore throat lozenges may ease your cough. ?Avoid close contacts especially the very young and the  elderly ?Cover your mouth if you cough or sneeze ?Always remember to wash your hands.  ? ?GET HELP RIGHT AWAY IF: ?You develop worsening fever. ?If your symptoms do not improve within 10 days ?You develop yellow or green discharge from your nose over 3 days. ?You have coughing fits ?You develop a severe head ache or visual changes. ?You develop shortness of breath, difficulty breathing or start having chest pain ?Your symptoms persist after you have completed your treatment plan ? ?MAKE SURE YOU  ?Understand these instructions. ?Will watch your condition. ?Will get help right away if you are not doing well or get worse. ? ?Thank you for choosing an e-visit. ? ?Your e-visit answers were reviewed by a board certified advanced clinical practitioner to complete your personal care plan. Depending upon the condition, your plan could have included both over the counter or prescription medications. ? ?Please review your pharmacy choice. Make sure the pharmacy is open so you can pick up prescription now. If there is a problem, you may contact your provider through MyChart messaging and have the prescription routed to another pharmacy.  Your safety is important to us. If you have drug allergies check your prescription carefully.  ? ?For the next 24 hours you can use MyChart to ask questions about today's visit, request a non-urgent call back, or ask for a work or school excuse. ?You will get an email in the next two days asking about your experience. I hope that your e-visit has been valuable and will speed your recovery. ? ? ? ? ?

## 2022-12-07 NOTE — Progress Notes (Signed)
Message sent to patient requesting further input regarding current symptoms. Awaiting patient response.  

## 2022-12-07 NOTE — Progress Notes (Signed)
I have spent 5 minutes in review of e-visit questionnaire, review and updating patient chart, medical decision making and response to patient.   Erdem Cody Bernarr Longsworth, PA-C    

## 2022-12-11 ENCOUNTER — Other Ambulatory Visit: Payer: Self-pay | Admitting: Neurology

## 2022-12-15 ENCOUNTER — Other Ambulatory Visit: Payer: Self-pay | Admitting: Nurse Practitioner

## 2022-12-15 DIAGNOSIS — F411 Generalized anxiety disorder: Secondary | ICD-10-CM

## 2022-12-21 ENCOUNTER — Other Ambulatory Visit: Payer: Self-pay | Admitting: Neurology

## 2022-12-22 ENCOUNTER — Telehealth: Payer: BC Managed Care – PPO | Admitting: Nurse Practitioner

## 2022-12-22 ENCOUNTER — Encounter: Payer: Self-pay | Admitting: Physician Assistant

## 2022-12-22 DIAGNOSIS — M545 Low back pain, unspecified: Secondary | ICD-10-CM

## 2022-12-22 NOTE — Progress Notes (Signed)
   Thank you for the details you included in the comment boxes. Those details are very helpful in determining the best course of treatment for you and help [place] to provide the best care.Because of your medical history, and the unknown cause for your back pain, we recommend that you convert this visit to a video visit in order for the provider to better assess what is going on.  The provider will be able to give you a more accurate diagnosis and treatment plan if we can more freely discuss your symptoms and with the addition of a virtual examination.   You may also choose to follow up with your primary care provider or an in person Urgent Care   If you convert to a video visit, we will bill your insurance (similar to an office visit) and you will not be charged for this e-Visit. You will be able to stay at home and speak with the first available [facility] advanced practice provider. The link to do a video visit is in the drop down Menu tab of your Welcome screen in MyChart.

## 2023-01-11 ENCOUNTER — Ambulatory Visit: Payer: BC Managed Care – PPO | Admitting: Physician Assistant

## 2023-01-11 VITALS — BP 126/89 | HR 79 | Temp 98.3°F | Resp 14 | Ht 71.0 in | Wt 192.4 lb

## 2023-01-11 DIAGNOSIS — M79604 Pain in right leg: Secondary | ICD-10-CM | POA: Diagnosis not present

## 2023-01-11 DIAGNOSIS — M5431 Sciatica, right side: Secondary | ICD-10-CM | POA: Diagnosis not present

## 2023-01-11 MED ORDER — METHYLPREDNISOLONE 4 MG PO TBPK
ORAL_TABLET | ORAL | 0 refills | Status: DC
Start: 2023-01-11 — End: 2023-03-23

## 2023-01-11 NOTE — Progress Notes (Signed)
Established patient visit   Patient: Elijah Huang   DOB: 1980/11/05   42 y.o. Male  MRN: [id number] Visit Date: 01/11/2023  Today's healthcare provider: Alfredia Ferguson, PA-C   Chief Complaint  Patient presents with   Leg Pain   Back Pain    Pain started in his back and has gone done to his leg for about 2 weeks now.   Subjective    Leg Pain   Back Pain Associated symptoms include leg pain. Pertinent negatives include no chest pain, fever or headaches.   Discussed the use of AI scribe software for clinical note transcription with the patient, who gave verbal consent to proceed.  History of Present Illness   The patient presents with a 2.5-3 week history of leg and back pain. The pain started in the middle of their back and has since moved around to the side and down the leg. The pain is constant and is felt in different places at different times. The most severe pain is currently located in the back of his right leg. The patient denies any specific injury or heavy lifting that could have caused the pain. They also report occasional numbness and a pins and needles sensation in the affected foot, especially after sitting for a while.      Medications: Outpatient Medications Prior to Visit  Medication Sig   busPIRone (BUSPAR) 10 MG tablet TAKE 1/2 TO 1 TABLET BY MOUTH TWICE A DAY AS NEEDED ANXIETY   cetirizine (ZYRTEC) 10 MG tablet Take 10 mg by mouth daily as needed.    Cholecalciferol (VITAMIN D3) 1000 units CAPS Take 1 capsule (1,000 Units total) by mouth daily.   cyanocobalamin (V-R VITAMIN B-12) 500 MCG tablet Take 1 tablet (500 mcg total) by mouth every Monday, Wednesday, and Friday.   escitalopram (LEXAPRO) 20 MG tablet Take 1 tablet (20 mg total) by mouth daily.   gabapentin (NEURONTIN) 300 MG capsule TAKE 1 CAPSULE BY MOUTH EVERYDAY AT BEDTIME   lamoTRIgine (LAMICTAL) 100 MG tablet TAKE 1 TABLET BY MOUTH IN THE MORNING AND TAKE 2 TABLETS BY MOUTH AT NIGHT   omeprazole  (PRILOSEC) 40 MG capsule TAKE 1 CAPSULE BY MOUTH EVERY DAY   No facility-administered medications prior to visit.    Review of Systems  Constitutional:  Negative for fatigue and fever.  Respiratory:  Negative for cough and shortness of breath.   Cardiovascular:  Negative for chest pain, palpitations and leg swelling.  Musculoskeletal:  Positive for back pain.  Neurological:  Negative for dizziness and headaches.     Objective    BP 126/89 (BP Location: Left Arm, Patient Position: Sitting, Cuff Size: Normal)   Pulse 79   Temp 98.3 F (36.8 C) (Oral)   Resp 14   Ht 5\' 11"  (1.803 m)   Wt 192 lb 6.4 oz (87.3 kg)   SpO2 100%   BMI 26.83 kg/m   Physical Exam Vitals reviewed.  Constitutional:      Appearance: He is not ill-appearing.  HENT:     Head: Normocephalic.  Eyes:     Conjunctiva/sclera: Conjunctivae normal.  Cardiovascular:     Rate and Rhythm: Normal rate.  Pulmonary:     Effort: Pulmonary effort is normal. No respiratory distress.  Musculoskeletal:     Comments: Right LE with no edema or erythema. Nontender calf.  Low back w/o rashes or edema. Right SLR positive for increased pain along lower extremity.   Neurological:  General: No focal deficit present.     Mental Status: He is alert and oriented to person, place, and time.  Psychiatric:        Mood and Affect: Mood normal.        Behavior: Behavior normal.      No results found for any visits on 01/11/23.  Assessment & Plan     Assessment and Plan    Right lower extremity pain, sciatica  -Order lower extremity ultrasound to rule out DVT, but low sus no swelling or redness. -Prescribe short course of steroids to help with sciatica pain. -Advise patient to apply ice or heat to the lower back. Okay to take ibuprofen prn q 6 hr -Follow-up if symptoms do not resolve in 2 weeks. Educated on warning signs of DVT        Return if symptoms worsen or fail to improve.      I, Alfredia Ferguson, PA-C have  reviewed all documentation for this visit. The documentation on  01/11/23   for the exam, diagnosis, procedures, and orders are all accurate and complete.  Alfredia Ferguson, PA-C [facility] [address] [place], [place], [address] Office: [phone] Fax: [phone]   [facility]

## 2023-01-17 ENCOUNTER — Encounter: Payer: Self-pay | Admitting: Physician Assistant

## 2023-01-19 ENCOUNTER — Encounter: Payer: Self-pay | Admitting: Physician Assistant

## 2023-01-21 ENCOUNTER — Ambulatory Visit: Payer: BC Managed Care – PPO

## 2023-01-25 ENCOUNTER — Ambulatory Visit
Admission: RE | Admit: 2023-01-25 | Discharge: 2023-01-25 | Disposition: A | Discharge status: Home / Self Care | Payer: BC Managed Care – PPO | Source: Ambulatory Visit | Attending: Physician Assistant | Admitting: Physician Assistant

## 2023-01-25 DIAGNOSIS — M79604 Pain in right leg: Secondary | ICD-10-CM | POA: Insufficient documentation

## 2023-01-25 DIAGNOSIS — M25561 Pain in right knee: Secondary | ICD-10-CM | POA: Diagnosis not present

## 2023-03-13 ENCOUNTER — Other Ambulatory Visit: Payer: Self-pay | Admitting: Nurse Practitioner

## 2023-03-13 DIAGNOSIS — F411 Generalized anxiety disorder: Secondary | ICD-10-CM

## 2023-03-17 ENCOUNTER — Encounter: Payer: Self-pay | Admitting: Family Medicine

## 2023-03-17 ENCOUNTER — Other Ambulatory Visit: Payer: Self-pay | Admitting: Family Medicine

## 2023-03-17 DIAGNOSIS — F411 Generalized anxiety disorder: Secondary | ICD-10-CM

## 2023-03-17 MED ORDER — ESCITALOPRAM OXALATE 20 MG PO TABS
20.0000 mg | ORAL_TABLET | Freq: Every day | ORAL | 0 refills | Status: DC
Start: 2023-03-17 — End: 2023-03-23

## 2023-03-22 ENCOUNTER — Ambulatory Visit: Payer: BC Managed Care – PPO | Admitting: Family Medicine

## 2023-03-23 ENCOUNTER — Encounter: Payer: Self-pay | Admitting: Family Medicine

## 2023-03-23 ENCOUNTER — Ambulatory Visit: Payer: BC Managed Care – PPO | Admitting: Family Medicine

## 2023-03-23 VITALS — BP 117/88 | HR 75 | Temp 98.1°F | Ht 71.0 in | Wt 191.0 lb

## 2023-03-23 DIAGNOSIS — G40909 Epilepsy, unspecified, not intractable, without status epilepticus: Secondary | ICD-10-CM

## 2023-03-23 DIAGNOSIS — F411 Generalized anxiety disorder: Secondary | ICD-10-CM

## 2023-03-23 DIAGNOSIS — F39 Unspecified mood [affective] disorder: Secondary | ICD-10-CM

## 2023-03-23 DIAGNOSIS — F520 Hypoactive sexual desire disorder: Secondary | ICD-10-CM | POA: Diagnosis not present

## 2023-03-23 DIAGNOSIS — Z23 Encounter for immunization: Secondary | ICD-10-CM

## 2023-03-23 MED ORDER — SILDENAFIL CITRATE 100 MG PO TABS
50.0000 mg | ORAL_TABLET | Freq: Every day | ORAL | 0 refills | Status: DC | PRN
Start: 2023-03-23 — End: 2023-10-04

## 2023-03-23 MED ORDER — GABAPENTIN 300 MG PO CAPS
300.0000 mg | ORAL_CAPSULE | Freq: Every day | ORAL | 3 refills | Status: DC
Start: 2023-03-23 — End: 2023-11-24

## 2023-03-23 MED ORDER — ESCITALOPRAM OXALATE 20 MG PO TABS
30.0000 mg | ORAL_TABLET | Freq: Every day | ORAL | 0 refills | Status: DC
Start: 2023-03-23 — End: 2023-06-21

## 2023-03-23 NOTE — Progress Notes (Unsigned)
Established patient visit   Patient: Elijah Huang   DOB: October 15, 1980   42 y.o. Male  MRN: [id number] Visit Date: 03/23/2023  Today's healthcare provider: Jacky Kindle, FNP  Introduced to nurse practitioner role and practice setting.  All questions answered.  Discussed provider/patient relationship and expectations.  Subjective    HPI HPI     Transferring providers    Additional comments: Patient had been seeing Lillia Abed and needed to establish with a new provider.         Refill medications    Additional comments: Patient was in need of refills on some of his medication and had to establish with a new provider since Lillia Abed is no longer in the practice      Last edited by Janey Greaser D, CMA on 03/23/2023  4:04 PM.         03/23/2023    4:19 PM 11/11/2022    3:33 PM 09/28/2022    3:22 PM  PHQ9 SCORE ONLY  PHQ-9 Total Score 5 6 2       03/23/2023    4:23 PM 11/11/2022    3:34 PM 09/28/2022    3:22 PM 08/17/2022    3:22 PM  GAD 7 : Generalized Anxiety Score  Nervous, Anxious, on Edge 1 1 2 1   Control/stop worrying 1 1 0 1  Worry too much - different things 0 0 1 1  Trouble relaxing 2 2 3 2   Restless 0 0 0 0  Easily annoyed or irritable 1 0 2 1  Afraid - awful might happen 1 0 0 0  Total GAD 7 Score 6 4 8 6   Anxiety Difficulty Somewhat difficult         Medications: Outpatient Medications Prior to Visit  Medication Sig  . busPIRone (BUSPAR) 10 MG tablet TAKE 1/2 TO 1 TABLET BY MOUTH TWICE A DAY AS NEEDED ANXIETY  . cetirizine (ZYRTEC) 10 MG tablet Take 10 mg by mouth daily as needed.   . Cholecalciferol (VITAMIN D3) 1000 units CAPS Take 1 capsule (1,000 Units total) by mouth daily.  . cyanocobalamin (V-R VITAMIN B-12) 500 MCG tablet Take 1 tablet (500 mcg total) by mouth every Monday, Wednesday, and Friday.  . escitalopram (LEXAPRO) 20 MG tablet Take 1 tablet (20 mg total) by mouth daily.  Marland Kitchen gabapentin (NEURONTIN) 300 MG capsule TAKE 1 CAPSULE BY MOUTH EVERYDAY  AT BEDTIME  . lamoTRIgine (LAMICTAL) 100 MG tablet TAKE 1 TABLET BY MOUTH IN THE MORNING AND TAKE 2 TABLETS BY MOUTH AT NIGHT  . omeprazole (PRILOSEC) 40 MG capsule TAKE 1 CAPSULE BY MOUTH EVERY DAY  . [DISCONTINUED] methylPREDNISolone (MEDROL DOSEPAK) 4 MG TBPK tablet Take 6 pills on day 1, 5 pills on day 2, 4 pills on day 3, 3 pills on day 4, 2 pills on  day 5, 1 pill on day 6   No facility-administered medications prior to visit.    Review of Systems  {Insert previous labs (optional):23779} {See past labs  Heme  Chem  Endocrine  Serology  Results Review (optional):1}   Objective    BP 117/88 (BP Location: Right Arm, Patient Position: Sitting, Cuff Size: Normal)   Pulse 75   Temp 98.1 F (36.7 C) (Oral)   Ht 5\' 11"  (1.803 m)   Wt 191 lb (86.6 kg)   SpO2 100%   BMI 26.64 kg/m  {Insert last BP/Wt (optional):23777}{See vitals history (optional):1}   Physical Exam  ***  No results found for any visits  on 03/23/23.  Assessment & Plan     Problem List Items Addressed This Visit   None Visit Diagnoses     Need for influenza vaccination    -  Primary   Relevant Orders   Flu vaccine trivalent PF, 6mos and older(Flulaval,Afluria,Fluarix,Fluzone) (Completed)       No follow-ups on file.      Leilani Merl, FNP, have reviewed all documentation for this visit. The documentation on 03/23/23 for the exam, diagnosis, procedures, and orders are all accurate and complete.  Jacky Kindle, FNP  [facility] [phone] (phone) [phone] (fax)  [facility]

## 2023-03-24 ENCOUNTER — Encounter: Payer: Self-pay | Admitting: Family Medicine

## 2023-03-24 DIAGNOSIS — F520 Hypoactive sexual desire disorder: Secondary | ICD-10-CM | POA: Insufficient documentation

## 2023-03-24 DIAGNOSIS — Z23 Encounter for immunization: Secondary | ICD-10-CM | POA: Insufficient documentation

## 2023-03-24 DIAGNOSIS — F39 Unspecified mood [affective] disorder: Secondary | ICD-10-CM | POA: Insufficient documentation

## 2023-03-24 NOTE — Assessment & Plan Note (Signed)
Chronic, stable Primary symptoms of uncontrolled anxiety with panic Continues on lexapro; plan to increase from 20 mg to 30 mg Continue on Buspar as needed; reporting some help but "not what is needed" on 10 mg BID PRN

## 2023-03-24 NOTE — Assessment & Plan Note (Signed)
Chronic, stable Followed by neuro Continues on gabapentin at 300 mg at bedtime as well as lamictal at 100 mg every morning and 200 mg at bedtime Previous drug profile at goal

## 2023-03-24 NOTE — Assessment & Plan Note (Signed)
Chronic, worsening Trial of medications to assist Encouraged to reach back out if symptoms do not improve; can complete testosterone labs prior to 11am

## 2023-04-15 ENCOUNTER — Encounter: Payer: Self-pay | Admitting: Family Medicine

## 2023-04-20 ENCOUNTER — Telehealth: Payer: BC Managed Care – PPO | Admitting: Nurse Practitioner

## 2023-04-20 ENCOUNTER — Encounter: Payer: Self-pay | Admitting: Family Medicine

## 2023-04-20 DIAGNOSIS — U071 COVID-19: Secondary | ICD-10-CM | POA: Diagnosis not present

## 2023-04-20 MED ORDER — BENZONATATE 100 MG PO CAPS
100.0000 mg | ORAL_CAPSULE | Freq: Three times a day (TID) | ORAL | 0 refills | Status: DC | PRN
Start: 2023-04-20 — End: 2023-11-24

## 2023-04-20 MED ORDER — FLUTICASONE PROPIONATE 50 MCG/ACT NA SUSP
2.0000 | Freq: Every day | NASAL | 6 refills | Status: AC
Start: 2023-04-20 — End: ?

## 2023-04-20 NOTE — Progress Notes (Signed)
E-Visit  for Positive Covid Test Result   We are sorry you are not feeling well. We are here to help!  You have tested positive for COVID-19, meaning that you were infected with the novel coronavirus and could give the virus to others.  Most people with COVID-19 have mild illness and can recover at home without medical care. Do not leave your home, except to get medical care. Do not visit public areas and do not go to places where you are unable to wear a mask. It is important that you stay home  to take care for yourself and to help protect other people in your home and community.      Isolation Instructions:   You are to isolate at home until you have been fever free for at least 24 hours without a fever-reducing medication, and symptoms have been steadily improving for 24 hours. At that time,  you can end isolation but need to mask for an additional 5 days.  If you must be around other household members who do not have symptoms, you need to make sure that both you and the family members are masking consistently with a high-quality mask.  If you note any worsening of symptoms despite treatment, please seek an in-person evaluation ASAP. If you note any significant shortness of breath or any chest pain, please seek ER evaluation. Please do not delay care!   Go to the nearest hospital ED for assessment if fever/cough/breathlessness are severe or illness seems like a threat to life.    The following symptoms may appear 2-14 days after exposure: Fever Cough Shortness of breath or difficulty breathing Chills Repeated shaking with chills Muscle pain Headache Sore throat New loss of taste or smell Fatigue Congestion or runny nose Nausea or vomiting Diarrhea  You can use medication such as prescription cough medication called Tessalon Perles 100 mg. You may take 1-2 capsules every 8 hours as needed for cough and prescription for Fluticasone nasal spray 2 sprays in each nostril one time  per day  You may also take acetaminophen (Tylenol) as needed for fever.  HOME CARE: Only take medications as instructed by your medical team. Drink plenty of fluids and get plenty of rest. A steam or ultrasonic humidifier can help if you have congestion.   GET HELP RIGHT AWAY IF YOU HAVE EMERGENCY WARNING SIGNS.  Call 911 or proceed to your closest emergency facility if: You develop worsening high fever. Trouble breathing Bluish lips or face Persistent pain or pressure in the chest New confusion Inability to wake or stay awake You cough up blood. Your symptoms become more severe Inability to hold down food or fluids  This list is not all possible symptoms. Contact your medical provider for any symptoms that are severe or concerning to you.   Your e-visit answers were reviewed by a board certified advanced clinical practitioner to complete your personal care plan.  Depending on the condition, your plan could have included both over the counter or prescription medications.  If there is a problem please reply once you have received a response from your provider.  Your safety is important to [place].  If you have drug allergies check your prescription carefully.    You can use MyChart to ask questions about today's visit, request a non-urgent call back, or ask for a work or school excuse for 24 hours related to this e-Visit. If it has been greater than 24 hours you will need to follow up with your  provider, or enter a new e-Visit to address those concerns. You will get an e-mail in the next two days asking about your experience.  I hope that your e-visit has been valuable and will speed your recovery. Thank you for using e-visits.  Meds ordered this encounter  Medications   benzonatate (TESSALON) 100 MG capsule    Sig: Take 1 capsule (100 mg total) by mouth 3 (three) times daily as needed.    Dispense:  30 capsule    Refill:  0   fluticasone (FLONASE) 50 MCG/ACT nasal spray    Sig: Place  2 sprays into both nostrils daily.    Dispense:  16 g    Refill:  6     I spent approximately 5 minutes reviewing the patient's history, current symptoms and coordinating their care today.

## 2023-04-21 ENCOUNTER — Other Ambulatory Visit: Payer: Self-pay | Admitting: Family Medicine

## 2023-04-21 MED ORDER — NIRMATRELVIR/RITONAVIR (PAXLOVID)TABLET: 3.0000 | ORAL_TABLET | Freq: Two times a day (BID) | ORAL | 0 refills | Status: AC

## 2023-05-09 ENCOUNTER — Ambulatory Visit: Payer: BC Managed Care – PPO | Admitting: Family Medicine

## 2023-05-12 ENCOUNTER — Encounter: Payer: Self-pay | Admitting: Neurology

## 2023-05-12 MED ORDER — RIZATRIPTAN BENZOATE 10 MG PO TBDP: 10.0000 mg | ORAL_TABLET | ORAL | 11 refills | Status: AC | PRN

## 2023-05-12 NOTE — Addendum Note (Signed)
Addended by: Glean Salvo on: 05/12/2023 04:18 PM   Modules accepted: Orders

## 2023-06-19 ENCOUNTER — Other Ambulatory Visit: Payer: Self-pay | Admitting: Family Medicine

## 2023-06-19 DIAGNOSIS — F39 Unspecified mood [affective] disorder: Secondary | ICD-10-CM

## 2023-06-20 NOTE — Telephone Encounter (Signed)
Please advise 

## 2023-06-22 ENCOUNTER — Other Ambulatory Visit: Payer: Self-pay | Admitting: Neurology

## 2023-06-23 ENCOUNTER — Ambulatory Visit: Payer: BC Managed Care – PPO | Admitting: Dermatology

## 2023-08-22 ENCOUNTER — Encounter: Payer: Self-pay | Admitting: Neurology

## 2023-08-23 ENCOUNTER — Ambulatory Visit: Payer: BC Managed Care – PPO | Admitting: Neurology

## 2023-10-03 ENCOUNTER — Telehealth: Payer: Self-pay | Admitting: Family Medicine

## 2023-10-03 DIAGNOSIS — F520 Hypoactive sexual desire disorder: Secondary | ICD-10-CM

## 2023-10-03 NOTE — Telephone Encounter (Signed)
 [facility] is requesting refill sildenafil (VIAGRA) 100 MG tablet  Please advise

## 2023-10-04 ENCOUNTER — Telehealth: Payer: Self-pay

## 2023-10-04 MED ORDER — SILDENAFIL CITRATE 100 MG PO TABS
50.0000 mg | ORAL_TABLET | Freq: Every day | ORAL | 0 refills | Status: AC | PRN
Start: 2023-10-04 — End: ?

## 2023-10-21 ENCOUNTER — Telehealth: Admitting: Physician Assistant

## 2023-10-21 DIAGNOSIS — H5712 Ocular pain, left eye: Secondary | ICD-10-CM

## 2023-10-21 DIAGNOSIS — H5789 Other specified disorders of eye and adnexa: Secondary | ICD-10-CM

## 2023-10-21 DIAGNOSIS — H1032 Unspecified acute conjunctivitis, left eye: Secondary | ICD-10-CM | POA: Diagnosis not present

## 2023-10-21 NOTE — Progress Notes (Signed)
  Because you are having severe eye pain with discharge and inability top open eye normally, I feel your condition warrants further evaluation and I recommend that you be seen in a face-to-face visit. You should have a formal eye exam as you may have a more severe eye infection that may affect vision permanently. This should be evaluated as soon as possible in person.   NOTE: There will be NO CHARGE for this E-Visit   If you are having a true medical emergency, please call 911.     For an urgent face to face visit, [place] has multiple urgent care centers for your convenience.  Click the link below for the full list of locations and hours, walk-in wait times, appointment scheduling options and driving directions:  Urgent Care - [place], [place], [place], [place], [place], [place]  [place]     Your MyChart E-visit questionnaire answers were reviewed by a board certified advanced clinical practitioner to complete your personal care plan based on your specific symptoms.    Thank you for using e-Visits.   I have spent 5 minutes in review of e-visit questionnaire, review and updating patient chart, medical decision making and response to patient.   Margaretann Loveless, PA-C

## 2023-11-01 NOTE — Telephone Encounter (Signed)
 Elijah Huang

## 2023-11-15 ENCOUNTER — Encounter: Payer: BC Managed Care – PPO | Admitting: Family Medicine

## 2023-11-17 ENCOUNTER — Emergency Department (HOSPITAL_BASED_OUTPATIENT_CLINIC_OR_DEPARTMENT_OTHER)

## 2023-11-17 ENCOUNTER — Other Ambulatory Visit: Payer: Self-pay

## 2023-11-17 ENCOUNTER — Emergency Department (HOSPITAL_BASED_OUTPATIENT_CLINIC_OR_DEPARTMENT_OTHER)
Admission: EM | Admit: 2023-11-17 | Discharge: 2023-11-17 | Disposition: A | Discharge status: Home / Self Care | Attending: Emergency Medicine | Admitting: Emergency Medicine

## 2023-11-17 ENCOUNTER — Encounter (HOSPITAL_BASED_OUTPATIENT_CLINIC_OR_DEPARTMENT_OTHER): Payer: Self-pay | Admitting: Radiology

## 2023-11-17 ENCOUNTER — Other Ambulatory Visit (HOSPITAL_BASED_OUTPATIENT_CLINIC_OR_DEPARTMENT_OTHER): Payer: Self-pay

## 2023-11-17 DIAGNOSIS — R0789 Other chest pain: Secondary | ICD-10-CM | POA: Diagnosis not present

## 2023-11-17 DIAGNOSIS — R197 Diarrhea, unspecified: Secondary | ICD-10-CM | POA: Diagnosis not present

## 2023-11-17 DIAGNOSIS — K429 Umbilical hernia without obstruction or gangrene: Secondary | ICD-10-CM | POA: Diagnosis not present

## 2023-11-17 DIAGNOSIS — R109 Unspecified abdominal pain: Secondary | ICD-10-CM | POA: Diagnosis not present

## 2023-11-17 DIAGNOSIS — R079 Chest pain, unspecified: Secondary | ICD-10-CM | POA: Diagnosis not present

## 2023-11-17 DIAGNOSIS — K760 Fatty (change of) liver, not elsewhere classified: Secondary | ICD-10-CM | POA: Diagnosis not present

## 2023-11-17 LAB — HEPATIC FUNCTION PANEL
ALT: 19 U/L (ref 0–44)
AST: 19 U/L (ref 15–41)
Albumin: 5.1 g/dL — ABNORMAL HIGH (ref 3.5–5.0)
Alkaline Phosphatase: 102 U/L (ref 38–126)
Bilirubin, Direct: 0.4 mg/dL — ABNORMAL HIGH (ref 0.0–0.2)
Indirect Bilirubin: 0.6 mg/dL (ref 0.3–0.9)
Total Bilirubin: 1.1 mg/dL (ref 0.0–1.2)
Total Protein: 7.5 g/dL (ref 6.5–8.1)

## 2023-11-17 LAB — LIPASE, BLOOD: Lipase: 109 U/L — ABNORMAL HIGH (ref 11–51)

## 2023-11-17 LAB — BASIC METABOLIC PANEL WITH GFR
Anion gap: 15 (ref 5–15)
BUN: 20 mg/dL (ref 6–20)
CO2: 25 mmol/L (ref 22–32)
Calcium: 10.6 mg/dL — ABNORMAL HIGH (ref 8.9–10.3)
Chloride: 103 mmol/L (ref 98–111)
Creatinine, Ser: 1.24 mg/dL (ref 0.61–1.24)
GFR, Estimated: >60 mL/min (ref >60)
Glucose, Bld: 140 mg/dL — ABNORMAL HIGH (ref 70–99)
Potassium: 3.7 mmol/L (ref 3.5–5.1)
Sodium: 143 mmol/L (ref 135–145)

## 2023-11-17 LAB — CBC WITH DIFFERENTIAL/PLATELET
Abs Immature Granulocytes: 0.05 10*3/uL (ref 0.00–0.07)
Basophils Absolute: 0 10*3/uL (ref 0.0–0.1)
Basophils Relative: 0 %
Eosinophils Absolute: 0.1 10*3/uL (ref 0.0–0.5)
Eosinophils Relative: 1 %
HCT: 48.5 % (ref 39.0–52.0)
Hemoglobin: 16.7 g/dL (ref 13.0–17.0)
Immature Granulocytes: 1 %
Lymphocytes Relative: 6 %
Lymphs Abs: 0.6 10*3/uL — ABNORMAL LOW (ref 0.7–4.0)
MCH: 28.6 pg (ref 26.0–34.0)
MCHC: 34.4 g/dL (ref 30.0–36.0)
MCV: 83.2 fL (ref 80.0–100.0)
Monocytes Absolute: 0.6 10*3/uL (ref 0.1–1.0)
Monocytes Relative: 6 %
Neutro Abs: 8.9 10*3/uL — ABNORMAL HIGH (ref 1.7–7.7)
Neutrophils Relative %: 86 %
Platelets: 314 10*3/uL (ref 150–400)
RBC: 5.83 MIL/uL — ABNORMAL HIGH (ref 4.22–5.81)
RDW: 12.7 % (ref 11.5–15.5)
WBC: 10.3 10*3/uL (ref 4.0–10.5)
nRBC: 0 % (ref 0.0–0.2)

## 2023-11-17 LAB — D-DIMER, QUANTITATIVE: D-Dimer, Quant: <0.27 ug{FEU}/mL (ref 0.00–0.50)

## 2023-11-17 LAB — TROPONIN T, HIGH SENSITIVITY: Troponin T High Sensitivity: <15 ng/L (ref <19)

## 2023-11-17 MED ORDER — ONDANSETRON HCL 4 MG/2ML IJ SOLN
4.0000 mg | Freq: Once | INTRAMUSCULAR | Status: AC
  Administered 2023-11-17: 4 mg via INTRAVENOUS
  Filled 2023-11-17: qty 2

## 2023-11-17 MED ORDER — IOHEXOL 300 MG/ML  SOLN
100.0000 mL | Freq: Once | INTRAMUSCULAR | Status: AC | PRN
  Administered 2023-11-17: 100 mL via INTRAVENOUS

## 2023-11-17 MED ORDER — SODIUM CHLORIDE 0.9 % IV BOLUS
1000.0000 mL | Freq: Once | INTRAVENOUS | Status: AC
  Administered 2023-11-17: 1000 mL via INTRAVENOUS

## 2023-11-17 MED ORDER — ONDANSETRON 4 MG PO TBDP
4.0000 mg | ORAL_TABLET | Freq: Three times a day (TID) | ORAL | 0 refills | Status: DC | PRN
  Filled 2023-11-17: qty 20, 7d supply, fill #0

## 2023-11-17 NOTE — Discharge Instructions (Addendum)
 Take Zofran as needed for nausea.  Follow-up with your primary care doctor peer return if symptoms worsen.

## 2023-11-17 NOTE — ED Provider Notes (Signed)
 [facility] Provider Note   CSN: [id number] Arrival date & time: 11/17/23  4098     History  Chief Complaint  Patient presents with   Chest Pain    Elijah Huang is a 43 y.o. male.  Patient here with chest pain started this morning.  Sweating indigestion nausea diarrhea pain radiating to both hands.  History of acid reflux ED seizure disorder migraines.  Nothing makes it worse or better.  Occasional alcohol use.  No fever or chills.  No significant medical history otherwise.  No recent surgery or travel.  The history is provided by the patient.       Home Medications Prior to Admission medications   Medication Sig Start Date End Date Taking? Authorizing Provider  ondansetron (ZOFRAN-ODT) 4 MG disintegrating tablet Take 1 tablet (4 mg total) by mouth every 8 (eight) hours as needed. 11/17/23  Yes Primrose Oler, DO  benzonatate  (TESSALON ) 100 MG capsule Take 1 capsule (100 mg total) by mouth 3 (three) times daily as needed. 04/20/23   Mardene Shake, FNP  busPIRone  (BUSPAR ) 10 MG tablet TAKE 1/2 TO 1 TABLET BY MOUTH TWICE A DAY AS NEEDED ANXIETY 12/15/22   Drubel, Heidi Llamas, PA-C  cetirizine (ZYRTEC) 10 MG tablet Take 10 mg by mouth daily as needed.     [provider]  Cholecalciferol (VITAMIN D3) 1000 units CAPS Take 1 capsule (1,000 Units total) by mouth daily. 11/22/15   Claire Crick, MD  cyanocobalamin  (V-R VITAMIN B-12) 500 MCG tablet Take 1 tablet (500 mcg total) by mouth every Monday, Wednesday, and Friday. 04/29/17   Claire Crick, MD  escitalopram  (LEXAPRO ) 20 MG tablet TAKE 1 AND 1/2 TABLETS DAILY BY MOUTH 06/21/23   Normie Becton, FNP  fluticasone  (FLONASE ) 50 MCG/ACT nasal spray Place 2 sprays into both nostrils daily. 04/20/23   Mardene Shake, FNP  gabapentin  (NEURONTIN ) 300 MG capsule Take 1 capsule (300 mg total) by mouth at bedtime. 03/23/23   Normie Becton, FNP  lamoTRIgine  (LAMICTAL ) 100 MG tablet TAKE 1  TABLET BY MOUTH IN THE MORNING AND TAKE 2 TABLETS BY MOUTH AT NIGHT 06/22/23   Wess Hammed, NP  omeprazole  (PRILOSEC) 40 MG capsule TAKE 1 CAPSULE BY MOUTH EVERY DAY 11/22/22   Drubel, Heidi Llamas, PA-C  rizatriptan  (MAXALT -MLT) 10 MG disintegrating tablet Take 1 tablet (10 mg total) by mouth as needed for migraine. May repeat in 2 hours if needed 05/12/23   Wess Hammed, NP  sildenafil  (VIAGRA ) 100 MG tablet Take 0.5-1 tablets (50-100 mg total) by mouth daily as needed for erectile dysfunction. 10/04/23   Ostwalt, Janna, PA-C      Allergies    Keppra  Brandt.Broom ]    Review of Systems   Review of Systems  Physical Exam Updated Vital Signs BP 125/89 (BP Location: Right Arm)   Pulse (!) 106   Temp 99.3 F (37.4 C) (Oral)   Resp 18   SpO2 96%  Physical Exam Vitals and nursing note reviewed.  Constitutional:      General: He is not in acute distress.    Appearance: He is well-developed. He is not ill-appearing.  HENT:     Head: Normocephalic and atraumatic.  Eyes:     Extraocular Movements: Extraocular movements intact.     Conjunctiva/sclera: Conjunctivae normal.     Pupils: Pupils are equal, round, and reactive to light.  Cardiovascular:     Rate and Rhythm: Normal rate and regular rhythm.  Pulses:          Radial pulses are 2+ on the right side and 2+ on the left side.     Heart sounds: Normal heart sounds. No murmur heard. Pulmonary:     Effort: Pulmonary effort is normal. No respiratory distress.     Breath sounds: Normal breath sounds.  Abdominal:     Palpations: Abdomen is soft.     Tenderness: There is no abdominal tenderness.  Musculoskeletal:        General: No swelling. Normal range of motion.     Cervical back: Normal range of motion and neck supple.     Right lower leg: No edema.     Left lower leg: No edema.  Skin:    General: Skin is warm and dry.     Capillary Refill: Capillary refill takes less than 2 seconds.  Neurological:     General: No focal  deficit present.     Mental Status: He is alert.  Psychiatric:        Mood and Affect: Mood normal.     ED Results / Procedures / Treatments   Labs (all labs ordered are listed, but only abnormal results are displayed) Labs Reviewed  CBC WITH DIFFERENTIAL/PLATELET - Abnormal; Notable for the following components:      Result Value   RBC 5.83 (*)    Neutro Abs 8.9 (*)    Lymphs Abs 0.6 (*)    All other components within normal limits  BASIC METABOLIC PANEL WITH GFR - Abnormal; Notable for the following components:   Glucose, Bld 140 (*)    Calcium 10.6 (*)    All other components within normal limits  HEPATIC FUNCTION PANEL - Abnormal; Notable for the following components:   Albumin 5.1 (*)    Bilirubin, Direct 0.4 (*)    All other components within normal limits  LIPASE, BLOOD - Abnormal; Notable for the following components:   Lipase 109 (*)    All other components within normal limits  D-DIMER, QUANTITATIVE  TROPONIN T, HIGH SENSITIVITY    EKG EKG Interpretation Date/Time:  Thursday Nov 17 2023 08:28:18 EDT Ventricular Rate:  104 PR Interval:  143 QRS Duration:  94 QT Interval:  331 QTC Calculation: 436 R Axis:   68  Text Interpretation: Sinus tachycardia Consider right atrial enlargement Confirmed by Lowery Rue [phone]) on 11/17/2023 8:30:55 AM  Radiology CT ABDOMEN PELVIS W CONTRAST Result Date: 11/17/2023 CLINICAL DATA:  Abdominal pain, acute, nonlocalized. EXAM: CT ABDOMEN AND PELVIS WITH CONTRAST TECHNIQUE: Multidetector CT imaging of the abdomen and pelvis was performed using the standard protocol following bolus administration of intravenous contrast. RADIATION DOSE REDUCTION: This exam was performed according to the departmental dose-optimization program which includes automated exposure control, adjustment of the mA and/or kV according to patient size and/or use of iterative reconstruction technique. CONTRAST:  OMNIPAQUE  IOHEXOL  300 MG/ML  SOLN  COMPARISON:  None Available. FINDINGS: Lower chest: There are dependent changes in the visualized lung bases. No overt consolidation. No pleural effusion. The heart is normal in size. No pericardial effusion. Hepatobiliary: The liver is normal in size. Non-cirrhotic configuration. No suspicious liver mass. There is a subcentimeter sized subcapsular hypoattenuating focus in the left hepatic dome (series 601, image 109), which is too small to adequately characterize. There is mild diffuse hepatic steatosis. No intrahepatic or extrahepatic bile duct dilation. No calcified gallstones. Normal gallbladder wall thickness. No pericholecystic inflammatory changes. Pancreas: Unremarkable. No pancreatic ductal dilatation or surrounding inflammatory  changes. Spleen: Within normal limits. No focal lesion. Adrenals/Urinary Tract: Adrenal glands are unremarkable. No suspicious renal mass. No hydronephrosis. No renal or ureteric calculi. Unremarkable urinary bladder. Stomach/Bowel: No disproportionate dilation of the small or large bowel loops. No evidence of abnormal bowel wall thickening or inflammatory changes. The appendix is unremarkable. There is liquid unformed stool throughout the colon, which is nonspecific but can be seen with diarrhea. Vascular/Lymphatic: No ascites or pneumoperitoneum. No abdominal or pelvic lymphadenopathy, by size criteria. No aneurysmal dilation of the major abdominal arteries. Reproductive: Normal size prostate. Symmetric seminal vesicles. Other: There is a tiny fat containing umbilical hernia. The soft tissues and abdominal wall are otherwise unremarkable. Musculoskeletal: No suspicious osseous lesions. IMPRESSION: 1. No acute inflammatory process identified within the abdomen or pelvis. There is liquid unformed stool throughout the colon, which is nonspecific but can be seen with diarrhea. 2. Multiple other nonacute observations, as described above. Electronically Signed   By: Beula Brunswick M.D.    On: 11/17/2023 10:19   DG Chest Portable 1 View Result Date: 11/17/2023 CLINICAL DATA:  cp EXAM: PORTABLE CHEST - 1 VIEW COMPARISON:  October 21, 2014 FINDINGS: No focal airspace consolidation, pleural effusion, or pneumothorax. No cardiomegaly. No acute fracture or destructive lesion. IMPRESSION: No acute cardiopulmonary abnormality. Electronically Signed   By: Rance Burrows M.D.   On: 11/17/2023 09:20    Procedures Procedures    Medications Ordered in ED Medications  sodium chloride  0.9 % bolus 1,000 mL (0 mLs Intravenous Stopped 11/17/23 0918)  ondansetron (ZOFRAN) injection 4 mg (4 mg Intravenous Given 11/17/23 0858)  iohexol  (OMNIPAQUE ) 300 MG/ML solution 100 mL (100 mLs Intravenous Contrast Given 11/17/23 0945)    ED Course/ Medical Decision Making/ A&P                                 Medical Decision Making Amount and/or Complexity of Data Reviewed Labs: ordered. Radiology: ordered.  Risk Prescription drug management.   Aimee Houseman [place] is here with chest pain but also some indigestion and nausea with diarrhea.  Unremarkable vitals.  No fever.  EKG shows sinus rhythm.  No ischemic changes.  Differential diagnosis likely GI related process or stress related process seems less likely to be ACS.  e doubt PE.  Denies any cough or sputum production.  Seems less likely to be cholecystitis or pancreatitis.  Will do broad workup with CBC BMP hepatic function panel lipase troponin, d dimer, EKG chest x-Ranum.  Troponin normal.  D-dimer normal.  Lipase is mildly elevated so CT scan was obtained which was unremarkable for radiology report.  This showed likely diarrheal state which she is having.  He has no significant leukocytosis anemia or electrolyte abnormality per my review and interpretation of labs otherwise.  Chest x-Rief unremarkable.  No pneumonia or pneumothorax.  Overall follow-up with primary care doctor return if symptoms worsen.  Will prescribe Zofran.  Discharged in good  condition.  This chart was dictated using voice recognition software.  Despite best efforts to proofread,  errors can occur which can change the documentation meaning.         Final Clinical Impression(s) / ED Diagnoses Final diagnoses:  Atypical chest pain  Diarrhea, unspecified type    Rx / DC Orders ED Discharge Orders          Ordered    ondansetron (ZOFRAN-ODT) 4 MG disintegrating tablet  Every 8 hours PRN  11/17/23 1033              Lowery Rue, DO 11/17/23 1035

## 2023-11-17 NOTE — ED Triage Notes (Signed)
 Pt reports chest pain that developed this morning.   Reports sweating, some SOB, indigestion, nausea, diarrhea, pain radiating to both hands

## 2023-11-21 ENCOUNTER — Inpatient Hospital Stay: Admitting: Family Medicine

## 2023-11-24 ENCOUNTER — Ambulatory Visit (HOSPITAL_BASED_OUTPATIENT_CLINIC_OR_DEPARTMENT_OTHER): Admitting: Student

## 2023-11-24 ENCOUNTER — Encounter (HOSPITAL_BASED_OUTPATIENT_CLINIC_OR_DEPARTMENT_OTHER): Payer: Self-pay | Admitting: Student

## 2023-11-24 VITALS — BP 118/75 | HR 72 | Temp 97.8°F | Resp 16 | Ht 70.47 in | Wt 181.4 lb

## 2023-11-24 DIAGNOSIS — F17220 Nicotine dependence, chewing tobacco, uncomplicated: Secondary | ICD-10-CM

## 2023-11-24 DIAGNOSIS — R748 Abnormal levels of other serum enzymes: Secondary | ICD-10-CM | POA: Diagnosis not present

## 2023-11-24 DIAGNOSIS — F39 Unspecified mood [affective] disorder: Secondary | ICD-10-CM

## 2023-11-24 DIAGNOSIS — K219 Gastro-esophageal reflux disease without esophagitis: Secondary | ICD-10-CM

## 2023-11-24 DIAGNOSIS — Z7689 Persons encountering health services in other specified circumstances: Secondary | ICD-10-CM

## 2023-11-24 DIAGNOSIS — G40909 Epilepsy, unspecified, not intractable, without status epilepticus: Secondary | ICD-10-CM | POA: Diagnosis not present

## 2023-11-24 MED ORDER — OMEPRAZOLE 40 MG PO CPDR: 40.0000 mg | DELAYED_RELEASE_CAPSULE | Freq: Every day | ORAL | 3 refills | Status: DC

## 2023-11-24 MED ORDER — GABAPENTIN 300 MG PO CAPS: 300.0000 mg | ORAL_CAPSULE | Freq: Every day | ORAL | 3 refills | Status: DC

## 2023-11-24 MED ORDER — ESCITALOPRAM OXALATE 10 MG PO TABS: 10.0000 mg | ORAL_TABLET | Freq: Every day | ORAL | 3 refills | Status: DC

## 2023-11-24 MED ORDER — FAMOTIDINE 20 MG PO TABS
20.0000 mg | ORAL_TABLET | Freq: Every day | ORAL | 5 refills | Status: AC
Start: 2023-11-24 — End: ?

## 2023-11-24 NOTE — Assessment & Plan Note (Signed)
 Stable. Seizure disorder is well-managed with Lamictal , with no seizures in the past five years. Occasional brain fog is noted. No recent grand mal seizures, primarily focal seizures in the past. Neurologist is Sarah Spike. - Continue Lamictal  as prescribed - Refill gabapentin  for neuropathic pain - Continue to follow with neuro

## 2023-11-24 NOTE — Patient Instructions (Addendum)
 It was nice to see you today!  As we discussed in clinic:  Please begin taking the omeprazole  in the morning away from all food or medications for 30 minutes after taking the medication. If you need anything else to reduce acid feel free to take a pepcid later in the day.  If you have any problems before your next visit feel free to message me via MyChart (minor issues or questions) or call the office, otherwise you may reach out to schedule an office visit.  Thank you! Harjit Leider, PA-C

## 2023-11-24 NOTE — Assessment & Plan Note (Signed)
 Intermittent heartburn, sometimes requiring two doses of omeprazole  per day depending on dietary intake. Current regimen includes omeprazole  40 mg, taken at night. Advised to take omeprazole  in the morning before food for better absorption and efficacy. Pepcid prescribed for additional relief as needed. - Take omeprazole  40 mg daily in the morning before food - Prescribe Pepcid as needed for additional heartburn relief - Advise against overuse of Tums due to elevated calcium

## 2023-11-24 NOTE — Progress Notes (Signed)
 New Patient Office Visit  Subjective    Patient ID: Elijah Huang, male    DOB: September 22, 1980  Age: 43 y.o. MRN: [id number]  CC:  Chief Complaint  Patient presents with   Establish Care    Here to establish care. Last PCP left.    ER follow up    Went to [facility] ER. Arms were going numb. Pancreas levels were high. They said pancreas would make his arms numb if there are issues with it.    Discussed the use of AI scribe software for clinical note transcription with the patient, who gave verbal consent to proceed.  History of Present Illness   Elijah Huang is a 44 year old male who presents for follow-up after an ER visit for chest pain and numbness.  Elijah Huang experienced chest pain and numbness in his arms, which began with nausea and vomiting twice the previous weekend, followed by weakness. On the morning of the ER visit, Elijah Huang had numbness in his hands and arms, along with chest pain. The ER noted elevated pancreatic enzyme levels. Negative cardiac workup and D-dimer. No longer getting numbness in arms, suspect panic attack in light of mood disorder history.  Elijah Huang has a history of seizure disorder, managed with Lamictal , and has been seizure-free for about five years. Elijah Huang occasionally experiences 'brain fog'. His wife has noted some twitching at night, but Elijah Huang has not experienced any grand mal seizures, only focal seizures in the past.  Elijah Huang takes gabapentin  for shooting-type pain, initially prescribed by his neurologist. Elijah Huang also takes omeprazole  for heartburn, which Elijah Huang sometimes needs to take twice a day depending on his diet.  Elijah Huang smokes tobacco infrequently, about one to two packs per month, and has reduced his usage since starting a dental aligner treatment.  Following the ER visit, Elijah Huang experienced shortness of breath and diarrhea, which resolved within 24 hours.   Reviewed ER note from 11/17/23.    Screenings:  Colon Cancer: No fmh Lung Cancer: Chewing tobacco- 1-2 per month.  Diabetes:  indicated HLD: Indicated  The 10-year ASCVD risk score (Arnett DK, et al., 2019) is: 2%   Outpatient Encounter Medications as of 11/24/2023  Medication Sig   cetirizine (ZYRTEC) 10 MG tablet Take 10 mg by mouth daily as needed.    Cholecalciferol (VITAMIN D3) 1000 units CAPS Take 1 capsule (1,000 Units total) by mouth daily.   cyanocobalamin  (V-R VITAMIN B-12) 500 MCG tablet Take 1 tablet (500 mcg total) by mouth every Monday, Wednesday, and Friday.   famotidine (PEPCID) 20 MG tablet Take 1 tablet (20 mg total) by mouth at bedtime. As needed.   fluticasone  (FLONASE ) 50 MCG/ACT nasal spray Place 2 sprays into both nostrils daily. (Patient taking differently: Place 2 sprays into both nostrils as needed.)   lamoTRIgine  (LAMICTAL ) 100 MG tablet TAKE 1 TABLET BY MOUTH IN THE MORNING AND TAKE 2 TABLETS BY MOUTH AT NIGHT   ondansetron  (ZOFRAN -ODT) 4 MG disintegrating tablet Take 1 tablet (4 mg total) by mouth every 8 (eight) hours as needed.   rizatriptan  (MAXALT -MLT) 10 MG disintegrating tablet Take 1 tablet (10 mg total) by mouth as needed for migraine. May repeat in 2 hours if needed   sildenafil  (VIAGRA ) 100 MG tablet Take 0.5-1 tablets (50-100 mg total) by mouth daily as needed for erectile dysfunction.   [DISCONTINUED] escitalopram  (LEXAPRO ) 20 MG tablet TAKE 1 AND 1/2 TABLETS DAILY BY MOUTH   [DISCONTINUED] gabapentin  (NEURONTIN ) 300 MG capsule Take 1 capsule (300  mg total) by mouth at bedtime.   [DISCONTINUED] omeprazole  (PRILOSEC) 40 MG capsule TAKE 1 CAPSULE BY MOUTH EVERY DAY   busPIRone  (BUSPAR ) 10 MG tablet TAKE 1/2 TO 1 TABLET BY MOUTH TWICE A DAY AS NEEDED ANXIETY (Patient not taking: Reported on 11/24/2023)   escitalopram  (LEXAPRO ) 10 MG tablet Take 1 tablet (10 mg total) by mouth daily.   gabapentin  (NEURONTIN ) 300 MG capsule Take 1 capsule (300 mg total) by mouth at bedtime.   omeprazole  (PRILOSEC) 40 MG capsule Take 1 capsule (40 mg total) by mouth daily.   [DISCONTINUED] benzonatate   (TESSALON ) 100 MG capsule Take 1 capsule (100 mg total) by mouth 3 (three) times daily as needed. (Patient not taking: Reported on 11/24/2023)   No facility-administered encounter medications on file as of 11/24/2023.    Past Medical History:  Diagnosis Date   Anxiety Febuary 2024   COVID-19 virus infection 09/2020   ED (erectile dysfunction) of non-organic origin 2014   thought psychogenic by urology Dulcy Gibney)   GERD (gastroesophageal reflux disease)    History of chicken pox    Migraines    Seizure disorder (HCC) 2015   presumed partial seizures, started on depakote , rec no driving for 6 mo (07/6107, Dr Lacretia Piccolo)    Past Surgical History:  Procedure Laterality Date   TONSILLECTOMY  1989    Family History  Problem Relation Age of Onset   Irregular heart beat Mother    Diverticulitis Mother    Diabetes Father    Coronary artery disease Father 72       cabg 34, nonsmoker, obese   Prostate cancer Father 75   Heart disease Father    Heart Problems Sister    Heart disease Paternal Uncle    Heart disease Paternal Uncle    Cancer Maternal Grandfather        pancreatic   Coronary artery disease Paternal Grandfather 45       deceased from same   Heart disease Paternal Grandfather    Stroke Neg Hx     Social History   Socioeconomic History   Marital status: Married    Spouse name: Not on file   Number of children: 0   Years of education: 12   Highest education level: 12th grade  Occupational History   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  Tobacco Use   Smoking status: Some Days    Passive exposure: Current   Smokeless tobacco: Current    Types: Chew   Tobacco comments:    Chewing tobacco  Vaping Use   Vaping status: Never Used  Substance and Sexual Activity   Alcohol use: Yes    Alcohol/week: 7.0 standard drinks of alcohol    Types: 5 Cans of beer, 2 Standard drinks or equivalent per week    Comment: occasional   Drug use: No   Sexual activity: Yes     Partners: Female    Birth control/protection: None  Other Topics Concern   Not on file  Social History Narrative   Not on file   Social Drivers of Health   Financial Resource Strain: Low Risk  (01/11/2023)   Overall Financial Resource Strain (CARDIA)    Difficulty of Paying Living Expenses: Not hard at all  Food Insecurity: No Food Insecurity (11/24/2023)   Hunger Vital Sign    Worried About Running Out of Food in the Last Year: Never true    Ran Out of Food in the Last Year: Never true  Transportation Needs: No Transportation  Needs (11/24/2023)   PRAPARE - Administrator, Civil Service (Medical): No    Lack of Transportation (Non-Medical): No  Physical Activity: Sufficiently Active (01/11/2023)   Exercise Vital Sign    Days of Exercise per Week: 5 days    Minutes of Exercise per Session: 30 min  Stress: Stress Concern Present (01/11/2023)   [employer] of Occupational Health - Occupational Stress Questionnaire    Feeling of Stress : To some extent  Social Connections: Moderately Integrated (01/11/2023)   Social Connection and Isolation Panel [NHANES]    Frequency of Communication with Friends and Family: Three times a week    Frequency of Social Gatherings with Friends and Family: Twice a week    Attends Religious Services: More than 4 times per year    Active Member of [employer] or Organizations: No    Attends Engineer, structural: Not on file    Marital Status: Married  Catering manager Violence: Not At Risk (11/24/2023)   Humiliation, Afraid, Rape, and Kick questionnaire    Fear of Current or Ex-Partner: No    Emotionally Abused: No    Physically Abused: No    Sexually Abused: No    ROS  Per HPI      Objective    BP 118/75   Pulse 72   Temp 97.8 F (36.6 C) (Oral)   Resp 16   Ht 5' 10.47" (1.79 m)   Wt 181 lb 6.4 oz (82.3 kg)   SpO2 97%   BMI 25.68 kg/m   Physical Exam Constitutional:      General: Elijah Huang is not in acute distress.     Appearance: Normal appearance. Elijah Huang is not ill-appearing.  HENT:     Head: Normocephalic and atraumatic.     Right Ear: External ear normal.     Left Ear: External ear normal.     Nose: Nose normal.     Mouth/Throat:     Mouth: Mucous membranes are moist.     Pharynx: Oropharynx is clear.  Eyes:     General: No scleral icterus.    Extraocular Movements: Extraocular movements intact.     Conjunctiva/sclera: Conjunctivae normal.     Pupils: Pupils are equal, round, and reactive to light.  Neck:     Vascular: No carotid bruit.  Cardiovascular:     Rate and Rhythm: Normal rate and regular rhythm.     Pulses: Normal pulses.     Heart sounds: Normal heart sounds. No murmur heard.    No friction rub.  Pulmonary:     Effort: Pulmonary effort is normal. No respiratory distress.     Breath sounds: Normal breath sounds. No wheezing, rhonchi or rales.  Musculoskeletal:        General: Normal range of motion.     Cervical back: Neck supple.     Right lower leg: No edema.     Left lower leg: No edema.  Skin:    General: Skin is warm and dry.     Coloration: Skin is not jaundiced or pale.  Neurological:     General: No focal deficit present.     Mental Status: Elijah Huang is alert.  Psychiatric:        Mood and Affect: Mood normal.        Behavior: Behavior normal.         Assessment & Plan:   Encounter to establish care  Gastroesophageal reflux disease without esophagitis Assessment & Plan: Intermittent heartburn, sometimes requiring  two doses of omeprazole  per day depending on dietary intake. Current regimen includes omeprazole  40 mg, taken at night. Advised to take omeprazole  in the morning before food for better absorption and efficacy. Pepcid prescribed for additional relief as needed. - Take omeprazole  40 mg daily in the morning before food - Prescribe Pepcid as needed for additional heartburn relief - Advise against overuse of Tums due to elevated calcium  Orders: -      Omeprazole ; Take 1 capsule (40 mg total) by mouth daily.  Dispense: 90 capsule; Refill: 3 -     Famotidine; Take 1 tablet (20 mg total) by mouth at bedtime. As needed.  Dispense: 30 tablet; Refill: 5  Seizure disorder (HCC) Assessment & Plan: Stable. Seizure disorder is well-managed with Lamictal , with no seizures in the past five years. Occasional brain fog is noted. No recent grand mal seizures, primarily focal seizures in the past. Neurologist is Sarah Spike. - Continue Lamictal  as prescribed - Refill gabapentin  for neuropathic pain - Continue to follow with neuro  Orders: -     Gabapentin ; Take 1 capsule (300 mg total) by mouth at bedtime.  Dispense: 90 capsule; Refill: 3  Mood disorder (HCC) Assessment & Plan: Stable, chronic with GAD-7 score of 2.  - Continue current regimen of lexapro  10 mg daily. - Refill lexapro   Orders: -     Escitalopram  Oxalate; Take 1 tablet (10 mg total) by mouth daily.  Dispense: 90 tablet; Refill: 3  Elevated lipase Assessment & Plan: Recent ER visit with elevated lipase and symptoms suggestive of pancreatitis, but CT scan showed no significant findings. Symptoms may have been due to a GI bug or mild pancreatitis. Elevated calcium can increase risk for pancreatitis. - Order lipase level to recheck  Orders: -     Lipase  Hypercalcemia Assessment & Plan: Previous lab results showed elevated calcium. Discussed potential risks, including pancreatitis and kidney stones. - Order BMP to recheck calcium levels - Advise against overuse of Tums  Orders: -     Basic metabolic panel with GFR  Chewing tobacco dependence Assessment & Plan: Currently chews tobacco, approximately 1-2 packs per month. Reduced smoking due to use of a dental appliance similar to Invisalign. - Continue to taper off of chewing tobacco - Discuss in further detail at physical   Return in about 4 months (around 03/26/2024) for Annual Physical.   Laron Plummer Amber Guthridge, PA-C

## 2023-11-24 NOTE — Assessment & Plan Note (Signed)
 Currently chews tobacco, approximately 1-2 packs per month. Reduced smoking due to use of a dental appliance similar to Invisalign. - Continue to taper off of chewing tobacco - Discuss in further detail at physical

## 2023-11-24 NOTE — Assessment & Plan Note (Signed)
 Stable, chronic with GAD-7 score of 2.  - Continue current regimen of lexapro  10 mg daily. - Refill lexapro 

## 2023-11-24 NOTE — Assessment & Plan Note (Signed)
 Recent ER visit with elevated lipase and symptoms suggestive of pancreatitis, but CT scan showed no significant findings. Symptoms may have been due to a GI bug or mild pancreatitis. Elevated calcium can increase risk for pancreatitis. - Order lipase level to recheck

## 2023-11-24 NOTE — Assessment & Plan Note (Signed)
 Previous lab results showed elevated calcium. Discussed potential risks, including pancreatitis and kidney stones. - Order BMP to recheck calcium levels - Advise against overuse of Tums

## 2023-11-25 ENCOUNTER — Ambulatory Visit (HOSPITAL_BASED_OUTPATIENT_CLINIC_OR_DEPARTMENT_OTHER): Payer: Self-pay | Admitting: Student

## 2023-11-25 LAB — BASIC METABOLIC PANEL WITH GFR
BUN/Creatinine Ratio: 15 (ref 9–20)
BUN: 16 mg/dL (ref 6–24)
CO2: 27 mmol/L (ref 20–29)
Calcium: 9.6 mg/dL (ref 8.7–10.2)
Chloride: 103 mmol/L (ref 96–106)
Creatinine, Ser: 1.07 mg/dL (ref 0.76–1.27)
Glucose: 86 mg/dL (ref 70–99)
Potassium: 3.4 mmol/L — ABNORMAL LOW (ref 3.5–5.2)
Sodium: 144 mmol/L (ref 134–144)
eGFR: 90 mL/min/{1.73_m2} (ref >59)

## 2023-11-25 LAB — LIPASE: Lipase: 36 U/L (ref 13–78)

## 2023-11-30 ENCOUNTER — Encounter (HOSPITAL_BASED_OUTPATIENT_CLINIC_OR_DEPARTMENT_OTHER): Payer: Self-pay | Admitting: Student

## 2023-12-03 ENCOUNTER — Telehealth: Admitting: Physician Assistant

## 2023-12-03 DIAGNOSIS — B9689 Other specified bacterial agents as the cause of diseases classified elsewhere: Secondary | ICD-10-CM

## 2023-12-03 DIAGNOSIS — J019 Acute sinusitis, unspecified: Secondary | ICD-10-CM

## 2023-12-03 MED ORDER — AMOXICILLIN-POT CLAVULANATE 875-125 MG PO TABS: 1.0000 | ORAL_TABLET | Freq: Two times a day (BID) | ORAL | 0 refills | Status: DC

## 2023-12-03 NOTE — Progress Notes (Signed)

## 2024-01-01 ENCOUNTER — Other Ambulatory Visit: Payer: Self-pay | Admitting: Neurology

## 2024-01-03 ENCOUNTER — Telehealth: Payer: Self-pay

## 2024-01-03 NOTE — Telephone Encounter (Signed)
 MyChart message sent regarding refills  and appointment

## 2024-02-01 ENCOUNTER — Ambulatory Visit: Admitting: Pediatrics

## 2024-02-10 ENCOUNTER — Encounter: Payer: Self-pay | Admitting: Neurology

## 2024-02-10 ENCOUNTER — Other Ambulatory Visit: Payer: Self-pay | Admitting: Neurology

## 2024-02-13 MED ORDER — LAMOTRIGINE 100 MG PO TABS
ORAL_TABLET | ORAL | 0 refills | Status: DC
Start: 2024-02-13 — End: 2024-03-13

## 2024-02-14 NOTE — Telephone Encounter (Signed)
 Phone rep called pt to assist re: the need of an appointment, phone rep left a vm with office # asking for a call back, when pt calls back he needs to be connected to billing dept before an appointment can be made as a result of a confirmed bad debt bal.

## 2024-02-21 ENCOUNTER — Telehealth: Admitting: Physician Assistant

## 2024-02-21 DIAGNOSIS — B9689 Other specified bacterial agents as the cause of diseases classified elsewhere: Secondary | ICD-10-CM

## 2024-02-21 DIAGNOSIS — J019 Acute sinusitis, unspecified: Secondary | ICD-10-CM

## 2024-02-21 MED ORDER — AMOXICILLIN-POT CLAVULANATE 875-125 MG PO TABS: 1.0000 | ORAL_TABLET | Freq: Two times a day (BID) | ORAL | 0 refills | Status: DC

## 2024-02-21 NOTE — Progress Notes (Signed)

## 2024-02-21 NOTE — Progress Notes (Signed)
 I have spent 5 minutes in review of e-visit questionnaire, review and updating patient chart, medical decision making and response to patient.   Elsie Velma Lunger, PA-C

## 2024-02-23 ENCOUNTER — Telehealth: Payer: Self-pay | Admitting: Neurology

## 2024-02-23 NOTE — Telephone Encounter (Signed)
 Patient called to schedule an appointment to be able to get refills.

## 2024-03-13 MED ORDER — LAMOTRIGINE 100 MG PO TABS: ORAL_TABLET | ORAL | 0 refills | Status: DC

## 2024-03-13 NOTE — Addendum Note (Signed)
 Addended by: ONEITA NEVELYN BRAVO on: 03/13/2024 08:42 AM   Modules accepted: Orders

## 2024-03-26 ENCOUNTER — Encounter (HOSPITAL_BASED_OUTPATIENT_CLINIC_OR_DEPARTMENT_OTHER): Payer: Self-pay | Admitting: Student

## 2024-03-26 ENCOUNTER — Ambulatory Visit (INDEPENDENT_AMBULATORY_CARE_PROVIDER_SITE_OTHER): Admitting: Student

## 2024-03-26 ENCOUNTER — Other Ambulatory Visit (HOSPITAL_BASED_OUTPATIENT_CLINIC_OR_DEPARTMENT_OTHER): Payer: Self-pay

## 2024-03-26 VITALS — BP 125/80 | HR 81 | Temp 98.5°F | Resp 16 | Ht 70.47 in | Wt 188.8 lb

## 2024-03-26 DIAGNOSIS — G40909 Epilepsy, unspecified, not intractable, without status epilepticus: Secondary | ICD-10-CM | POA: Diagnosis not present

## 2024-03-26 DIAGNOSIS — K219 Gastro-esophageal reflux disease without esophagitis: Secondary | ICD-10-CM | POA: Diagnosis not present

## 2024-03-26 DIAGNOSIS — F39 Unspecified mood [affective] disorder: Secondary | ICD-10-CM

## 2024-03-26 DIAGNOSIS — Z23 Encounter for immunization: Secondary | ICD-10-CM | POA: Diagnosis not present

## 2024-03-26 DIAGNOSIS — Z0000 Encounter for general adult medical examination without abnormal findings: Secondary | ICD-10-CM | POA: Diagnosis not present

## 2024-03-26 MED ORDER — GABAPENTIN 300 MG PO CAPS
300.0000 mg | ORAL_CAPSULE | Freq: Every day | ORAL | 0 refills | Status: AC
  Filled 2024-03-26: qty 30, 30d supply, fill #0
  Filled 2024-04-20: qty 30, 30d supply, fill #1
  Filled 2024-05-21: qty 30, 30d supply, fill #2

## 2024-03-26 MED ORDER — OMEPRAZOLE 40 MG PO CPDR: 40.0000 mg | DELAYED_RELEASE_CAPSULE | Freq: Every day | ORAL | 3 refills | Status: AC

## 2024-03-26 NOTE — Patient Instructions (Addendum)
 It was nice to see you today!  As we discussed in clinic:  Use lexapro  5 mg daily, meaning a half tablet of the 10 mg daily. Let me know how it goes in 1 to 2 months.  If you have any problems before your next visit feel free to message me via MyChart (minor issues or questions) or call the office, otherwise you may reach out to schedule an office visit.  Thank you! Kenyata Napier, PA-C

## 2024-03-26 NOTE — Progress Notes (Unsigned)
 Complete physical exam  Patient: Elijah Huang   DOB: [birth year]   43 y.o. Male  MRN: [id number]  Subjective:    Chief Complaint  Patient presents with   Annual Exam    Annual exam. Wanted refill for gabapentin  to [facility] for today & also [facility].     Discussed the use of AI scribe software for clinical note transcription with the patient, who gave verbal consent to proceed.  History of Present Illness   Elijah Huang is a 43 year old male who presents for a refill of gabapentin  and an annual physical exam.  He prefers to receive his gabapentin  through [facility] for mail delivery and is currently on a 90-day supply.  He is taking Lexapro  10 mg daily and has previously used propranolol and Buspar  as needed for anxiety. He describes feeling 'emotionless' and 'spaced out' at times, which he attributes to his medication regimen. He is also on Lamictal .  His diet has remained consistent, though he has gained approximately seven pounds over the past few months, which he attributes to aging. He describes his diet as healthy and mentions walking as part of his exercise routine, though he acknowledges needing to incorporate more stretching.  He reports sleeping well, sometimes excessively, and notes that his job is not physically demanding. He has no current eye problems but acknowledges it has been about five years since his last eye exam. He receives regular dental care and has no current dental issues.  He is taking omeprazole  40 mg and plans to switch this prescription to [facility] as well. He is unsure about his hepatitis B vaccination status but recalls a screening test from 12 years ago.       Most recent fall risk assessment:    11/11/2022    3:33 PM  Fall Risk   Falls in the past year? 0  Number falls in past yr: 0  Injury with Fall? 0  Risk for fall due to : No Fall Risks  Follow up Falls evaluation completed     Most recent depression screenings:     03/26/2024    4:12 PM 11/24/2023    3:45 PM  PHQ 2/9 Scores  PHQ - 2 Score 0 1  PHQ- 9 Score 6 3    Patient Active Problem List   Diagnosis Date Noted   Elevated lipase 11/24/2023   Hypercalcemia 11/24/2023   Chewing tobacco dependence 11/24/2023   Mood disorder (HCC) 03/24/2023   Need for influenza vaccination 03/24/2023   Lack of sexual desire 03/24/2023   Migraine without aura and without status migrainosus, not intractable 08/28/2014   Seizure disorder (HCC)    GERD (gastroesophageal reflux disease) 05/04/2013   Past Medical History:  Diagnosis Date   Anxiety Febuary 2024   COVID-19 virus infection 09/2020   ED (erectile dysfunction) of non-organic origin 2014   thought psychogenic by urology Jacqulyne)   GERD (gastroesophageal reflux disease)    History of chicken pox    Migraines    Seizure disorder (HCC) 2015   presumed partial seizures, started on depakote , rec no driving for 6 mo (09/7982, Dr Onita LOACH)   Social History   Tobacco Use   Smoking status: Never    Passive exposure: Current   Smokeless tobacco: Current    Types: Chew   Tobacco comments:    Chewing tobacco  Vaping Use   Vaping status: Never Used  Substance Use Topics   Alcohol use: Not Currently  Comment: weekends, Friday-Sundays, a few beers a night and/or mixed drinks   Drug use: No   Allergies  Allergen Reactions   Keppra  [Levetiracetam ] Other (See Comments)    irritability      Patient Care Team: Rhoderick Farrel T, PA-C as PCP - General (Physician Assistant)   Outpatient Medications Prior to Visit  Medication Sig   amoxicillin -clavulanate (AUGMENTIN ) 875-125 MG tablet Take 1 tablet by mouth 2 (two) times daily.   cetirizine (ZYRTEC) 10 MG tablet Take 10 mg by mouth daily as needed.    Cholecalciferol (VITAMIN D3) 1000 units CAPS Take 1 capsule (1,000 Units total) by mouth daily.   cyanocobalamin  (V-R VITAMIN B-12) 500 MCG tablet Take 1 tablet (500 mcg total) by mouth every Monday,  Wednesday, and Friday.   famotidine  (PEPCID ) 20 MG tablet Take 1 tablet (20 mg total) by mouth at bedtime. As needed.   fluticasone  (FLONASE ) 50 MCG/ACT nasal spray Place 2 sprays into both nostrils daily. (Patient taking differently: Place 2 sprays into both nostrils as needed.)   lamoTRIgine  (LAMICTAL ) 100 MG tablet TAKE 1 TABLET BY MOUTH IN THE MORNING AND TAKE 2 TABLETS BY MOUTH AT NIGHT   ondansetron  (ZOFRAN -ODT) 4 MG disintegrating tablet Take 1 tablet (4 mg total) by mouth every 8 (eight) hours as needed.   rizatriptan  (MAXALT -MLT) 10 MG disintegrating tablet Take 1 tablet (10 mg total) by mouth as needed for migraine. May repeat in 2 hours if needed   sildenafil  (VIAGRA ) 100 MG tablet Take 0.5-1 tablets (50-100 mg total) by mouth daily as needed for erectile dysfunction.   [DISCONTINUED] busPIRone  (BUSPAR ) 10 MG tablet TAKE 1/2 TO 1 TABLET BY MOUTH TWICE A DAY AS NEEDED ANXIETY   [DISCONTINUED] gabapentin  (NEURONTIN ) 300 MG capsule Take 1 capsule (300 mg total) by mouth at bedtime.   [DISCONTINUED] omeprazole  (PRILOSEC) 40 MG capsule Take 1 capsule (40 mg total) by mouth daily.   escitalopram  (LEXAPRO ) 10 MG tablet Take 1 tablet (10 mg total) by mouth daily. (Patient not taking: Reported on 03/26/2024)   No facility-administered medications prior to visit.    ROS  Per HPI     Objective:     BP 125/80   Pulse 81   Temp 98.5 F (36.9 C) (Oral)   Resp 16   Ht 5' 10.47 (1.79 m)   Wt 188 lb 12.8 oz (85.6 kg)   SpO2 96%   BMI 26.73 kg/m  BP Readings from Last 3 Encounters:  03/26/24 125/80  11/24/23 118/75  11/17/23 125/89   Wt Readings from Last 3 Encounters:  03/26/24 188 lb 12.8 oz (85.6 kg)  11/24/23 181 lb 6.4 oz (82.3 kg)  03/23/23 191 lb (86.6 kg)      Physical Exam Constitutional:      General: He is not in acute distress.    Appearance: Normal appearance. He is not ill-appearing or diaphoretic.  HENT:     Head: Normocephalic and atraumatic.     Right Ear:  Tympanic membrane, ear canal and external ear normal.     Left Ear: Tympanic membrane, ear canal and external ear normal.     Nose: Nose normal.     Mouth/Throat:     Mouth: Mucous membranes are moist.     Pharynx: Oropharynx is clear.  Eyes:     General: No scleral icterus.       Right eye: No discharge.        Left eye: No discharge.     Extraocular Movements: Extraocular  movements intact.     Conjunctiva/sclera: Conjunctivae normal.     Pupils: Pupils are equal, round, and reactive to light.  Neck:     Thyroid : No thyroid  mass, thyromegaly or thyroid  tenderness.     Vascular: No carotid bruit.  Cardiovascular:     Rate and Rhythm: Normal rate and regular rhythm.     Pulses: Normal pulses.     Heart sounds: Normal heart sounds. No murmur heard.    No friction rub. No gallop.  Pulmonary:     Effort: Pulmonary effort is normal.     Breath sounds: Normal breath sounds. No wheezing, rhonchi or rales.  Chest:     Chest wall: No tenderness.  Abdominal:     General: Bowel sounds are normal. There is no distension.     Palpations: Abdomen is soft.     Tenderness: There is no abdominal tenderness. There is no guarding.  Musculoskeletal:        General: No swelling, deformity or signs of injury.     Cervical back: Neck supple.     Right lower leg: No edema.     Left lower leg: No edema.  Lymphadenopathy:     Cervical: No cervical adenopathy.     Right cervical: No superficial or posterior cervical adenopathy.    Left cervical: No superficial cervical adenopathy.  Skin:    Coloration: Skin is not jaundiced.     Findings: No rash.  Neurological:     General: No focal deficit present.     Mental Status: He is alert and oriented to person, place, and time.     Motor: No weakness.  Psychiatric:        Behavior: Behavior normal.      No results found for any visits on 03/26/24. Last CBC Lab Results  Component Value Date   WBC 10.3 11/17/2023   HGB 16.7 11/17/2023   HCT 48.5  11/17/2023   MCV 83.2 11/17/2023   MCH 28.6 11/17/2023   RDW 12.7 11/17/2023   PLT 314 11/17/2023   Last metabolic panel Lab Results  Component Value Date   GLUCOSE 86 11/24/2023   NA 144 11/24/2023   K 3.4 (L) 11/24/2023   CL 103 11/24/2023   CO2 27 11/24/2023   BUN 16 11/24/2023   CREATININE 1.07 11/24/2023   EGFR 90 11/24/2023   CALCIUM 9.6 11/24/2023   PROT 7.5 11/17/2023   ALBUMIN 5.1 (H) 11/17/2023   LABGLOB 1.9 11/11/2022   AGRATIO 2.6 (H) 11/11/2022   BILITOT 1.1 11/17/2023   ALKPHOS 102 11/17/2023   AST 19 11/17/2023   ALT 19 11/17/2023   ANIONGAP 15 11/17/2023   Last lipids Lab Results  Component Value Date   CHOL 184 11/11/2022   HDL 61 11/11/2022   LDLCALC 100 (H) 11/11/2022   TRIG 131 11/11/2022   CHOLHDL 3.0 11/11/2022   Last hemoglobin A1c No results found for: HGBA1C      Assessment & Plan:    Routine Health Maintenance and Physical Exam  Health Maintenance  Topic Date Due   Hepatitis B Vaccine (1 of 3 - 19+ 3-dose series) Never done   HPV Vaccine (1 - Risk 3-dose SCDM series) Never done   COVID-19 Vaccine (4 - 2025-26 season) 03/26/2025*   DTaP/Tdap/Td vaccine (3 - Td or Tdap) 11/10/2032   Flu Shot  Completed   Hepatitis C Screening  Completed   HIV Screening  Completed   Pneumococcal Vaccine  Aged Out   Meningitis B  Vaccine  Aged Out  *Topic was postponed. The date shown is not the original due date.   Encouraged him to engage in regular exercise appropriate for his age and condition.  Assessment and Plan    Adult Wellness Visit Overall health is well-managed. Blood pressure is well-controlled. Weight increased by 7 pounds over the past few months but remains consistent compared to last year. Previous labs showed slightly low potassium and elevated bilirubin. Diet and exercise habits are consistent, with weight gain attributed to age. - Schedule annual physical in one year - Encourage regular exercise - Continue healthy  diet  Mood disorder- Anxiety and mood symptoms Anxiety and mood symptoms are managed with Lexapro  10 mg daily. Reports of feeling emotionless, possibly due to medication. Previous use of propranolol and Buspar  for anxiety. Potential interaction with Lamictal  contributing to mood flattening. - Reduce Lexapro  dosage to 5 mg by splitting current 10 mg tablets - Monitor mood and anxiety symptoms over the next 1-2 months  Epilepsy Epilepsy is managed with Lamictal , which may also act as a mood stabilizer.  Gastroesophageal reflux disease (GERD) GERD is managed with omeprazole  40 mg. No new symptoms reported. - Send omeprazole  40 mg prescription to [facility] for mail delivery  General Health Maintenance General health maintenance is up to date. Tetanus vaccination is current. No recent eye examination in the last five years. Regular dental care is maintained. Hepatitis B vaccination status is unclear. - Recommend eye examination if not done in the last five years - Check hepatitis B immunity status; consider vaccination if not immune      Return in about 1 year (around 03/26/2025) for Annual Physical.     Nashaun Hillmer T Jazilyn Siegenthaler, PA-C

## 2024-04-12 ENCOUNTER — Encounter (HOSPITAL_BASED_OUTPATIENT_CLINIC_OR_DEPARTMENT_OTHER): Payer: Self-pay | Admitting: Student

## 2024-04-13 ENCOUNTER — Other Ambulatory Visit (HOSPITAL_BASED_OUTPATIENT_CLINIC_OR_DEPARTMENT_OTHER): Payer: Self-pay | Admitting: Student

## 2024-04-13 DIAGNOSIS — G40909 Epilepsy, unspecified, not intractable, without status epilepticus: Secondary | ICD-10-CM

## 2024-04-13 NOTE — Progress Notes (Signed)
 Patient has noted he will no longer be attending [facility] and would like to transfer to [facility]- referral placed based on patient preference.

## 2024-04-17 ENCOUNTER — Encounter: Payer: Self-pay | Admitting: Neurology

## 2024-04-24 ENCOUNTER — Other Ambulatory Visit: Payer: Self-pay

## 2024-04-24 ENCOUNTER — Encounter: Payer: Self-pay | Admitting: Pharmacist

## 2024-04-26 ENCOUNTER — Other Ambulatory Visit (HOSPITAL_BASED_OUTPATIENT_CLINIC_OR_DEPARTMENT_OTHER): Payer: Self-pay

## 2024-04-26 ENCOUNTER — Other Ambulatory Visit (HOSPITAL_COMMUNITY): Payer: Self-pay

## 2024-05-17 NOTE — Progress Notes (Deleted)
 PATIENT: Elijah Huang DOB: 1980/07/13  REASON FOR VISIT: follow up for seizures, migraines HISTORY FROM: patient PRIMARY NEUROLOGIST: Dr. Onita   HISTORY Elijah Huang is a 43 years old right-handed Caucasian male, accompanied by his girlfriend, referred by his primary care physician Dr. Rilla for evaluation of possible seizure, with his girl friend AprilHe had past medical history of GERD, erectile dysfunction He presented with seizure-like event, the initial episode was witness by his girlfriend, Dec 07 2013, after waking up talking with his girlfriend for a while, shortly afterwards, he was noticed to lie on his left side, body shaking, loss of consciousness, lasting for a few minutes, with tongue biting, lip numbness, next day, in May 30th 2015, while taking a shower, his girlfriend heard a loud thundering, he was sitting in the shower, confused, did not know what has happened, no self injury, He denied previous history of seizure, he presented to the emergency room Dec 09 2013, with constellation of complaints, intermittent chest pain, blurry vision, left eye pain, lightheadedness, He had a CAT scan of the brain that was normal, laboratory showed normal CBC, CMP, negative troponin, He works as a designer, industrial/product UPDATE Oct 14th 2015:Elijah Huang While taking titrating dose of Depakote  he continued to have recurrent sudden onset of lost of consciousness, MRI of the brain was normal, EEG was normal June 3rd 2015,, spaced out while talking with his nieced, lasting 2 minutes, no seizure activity   June 10th 2015: at work, become unresponsive, Depakote  ER was increased from 500 mg one tablet a day, to 2 tablets every night July 2nd 2015, in the car with his girl friend, spaced out, lasting 3-4 minues, staring, no body shaking,  July 3rd 2015: goes into staring, noot responside, when he comes too, his hand tremor. Sept 24th 2015:  At work, he became unresponsive to his manager, staring, hand tremor,  mild urinary incontinence He has no collection of hte events, no warning signs,   He noticed side effects on Depakote  this includes weight gain, increased bilateral hands tremor , Depakote  was stop in Nov 2015, started Keppra    UPDATE October 30 2014:He is doing well with keppra  1500mg  bid , Topamax  100mg  bid, no recurrent seizure, rarely has headache, Maxalt  was helpful.   UPDATE 04/30/2015 CMMr. Huang, 43 year old male returns for follow-up he has been seen in the past by Dr. Onita in our clinic . He has a history of generalized seizure disorder and migraine headaches. He is currently on Keppra  1500 mg twice daily and Topamax  100mg   twice daily. Last seizure was 05/22/2014. He continues to have an occasional headache which is usually relieved with Maxalt . He needs refills on Maxalt . He is not aware of any food triggers. He returns for reevaluation   UPDATE 07/24/2015. CMMr. Huang, 43 year old male returns for follow-up. He has a history of seizure disorder and migraines. His migraines are in excellent control on Topamax  100 twice daily and Maxalt  acutely. His last seizure event occurred in November 2015. He was last seen in the office 10/6/ 2016. At that time he was doing well on Keppra  however he called in to the office several weeks ago with increased irritability and tiredness on the drug. He returns today to talk about changing his seizure medications. He has previously been on Depakote  with continued seizures. His previous seizure episodes are described as his body gets stiff with jerking of the extremities and loss of consciousness. These have been witnessed by his girlfriend in the  past. He returns for reevaluation UPDATE 04/19/2017CM Elijah Huang, 43 year old male returns for follow-up he was last seen in the office 07/24/2015. He has history of migraines that are in excellent control with Topamax  also has a history of seizure disorder some increased irritability and tiredness on Keppra  reported at last visit.  He was successfully switched to Vimpat  and has tapered off of Keppra  without difficulty. He claims he feels much better. He returns for reevaluation no further seizure activity. UPDATE 10/31/2017CM Elijah Huang, 43 year old male returns for follow-up. He has a history of seizure disorder with last seizure event occurring in November 2015. He is currently on Vimpat . He has history of migraines and is on Topamax  100 mg twice daily Maxalt  acutely. His headaches are in excellent control and he rarely uses Maxalt . He just got tired by the [facility]. He returns for reevaluation   UPDATE 11/19/2018CM Elijah Huang, 44 year old male returns for follow-up with a history of seizure disorder.  Last seizure occurred in 2015.  In addition he has history of migraines.  He was on Vimpat  and Topamax  when last seen however his insurance changed and he could not afford the Vimpat  so his Topamax  was increased.  He is currently taking 300 mg total dose daily.  He is now working third shift, and complains of inability to go to sleep.  He is getting about 3-4 hours of sleep daily and his headaches and have increased since he has been on shift work.  His primary care has given him some information on good sleep habits and hygiene.  He was made aware that lack of sleep is not only a migraine trigger but a seizure trigger as well.  Maxalt  works acutely.  He returns for reevaluation   Update October 11 2018 SS: Elijah Huang 43 year old male, history of seizures and migraines, taking Topamax  100 mg, 1 in the morning, 2 at bedtime.  He reports he may have had one episode of seizure last summer, but he is not sure it was a seizure.  He reports he was in the bed had just woken up, was drowsy, remembers feeling like he could not new, lasting for a few seconds.  He reports it subsided, he felt drowsy but was able to move.  He denies any shaking or incontinence, oral trauma.  He reports in the past this is been similar to his previous seizure events.   He does report that he may have missed some of his Topamax  dosage during that time.  He reports his initial seizure was in 2015, his most notable recent seizure was in 2017.  Reports his headaches have been under good control, has not been having to take Maxalt  for several months.  He is currently working day shift, full-time, driving a car.  In the past he has been unable to tolerate Depakote  or Keppra .  Previously he was on Topamax  and Vimpat  for seizures.  He stopped his Vimpat  during a period of time when he lost his insurance and the medication was too expensive.  He denies any new problems or concerns.   Update November 08, 2019 SS: Here today for follow-up unaccompanied, he remains on Topamax  100 mg in the morning, 200 mg at bedtime.  He was previously on Topamax  100 mg twice a day, along with Vimpat  50 mg twice a day, but he lost his insurance, at that time he came off Vimpat , and Topamax  was increased.  He has not had recurrent seizure, there was some question  of a possible seizure in summer 2019, possibly missing some dosages of Topamax .  His seizures are usually nocturnal, he is now living alone, no seizure that he knows of.  Historically, he knows he had seizure, he wakes up the next morning feeling very drowsy, feeling different.  He works day shift at [employer].  Indicates his headaches have been under good control, he no longer takes Maxalt .  He is due for physical.  He does report general fatigue, he feels is related to the Topamax .  He denies significant snoring, after work, he may doze on the couch, go to bed early.  He equates this with occurring since he went on a higher dose of Topamax .  Update January 07, 2020 SS: Here today to discuss medication adjustment, felt to have fatigue secondary to Topamax .  Currently taking Topamax  100 mg am/200 mg pm.  Recently saw PCP, no laboratory etiology for fatigue.  At one point was taking Topamax  and then stopped due to loss of insurance, even with  insurance Vimpat  was expensive.  Reports fatigue, mostly at the end of the day, does okay at work, feels over not much energy, sleeps well, not much snoring, no report of frequent morning headache.  Headaches remain well controlled, a few a month, responds well to Advil.  Update Nov 11, 2020 SS: Off Topamax  due to to fatigue, has noted way less fatigue. On Lamictal  100 mg AM/200 mg PM. Only side effects: occasional vivid dreams, few times felt disconnected, but not like seizure. No seizures. Headaches are well controlled, will take occasional Advil. Work is going well, full time. No change to health.   Update Nov 11, 2021 SS: Remains on Lamictal  100/200 mg daily. Few episodes of shock like pains in his head when laying down no more than twice a week for last month or so, only few seconds, not clear triggers.  Denies any seizures or migraine headaches.  Update April 13, 2022 SS: Still reports shock like pains every few seconds to top of head a few nights a week when lying in bed, he holds his head, it goes away, passes no more than 5 minutes. A few nights while sitting on the couch. In past has had migraines. Tried new pillow, positioning.   Update July 18, 2022 SS: Doing well, taking gabapentin  300 mg at bedtime, no more shock like pains to the head, no seizures. Lamictal  level was 5.7 in May 2023, CBC CMP unremarkable.  On Lamictal .  REVIEW OF SYSTEMS: Out of a complete 14 system review of symptoms, the patient complains only of the following symptoms, and all other reviewed systems are negative.  See HPI  ALLERGIES: Allergies  Allergen Reactions   Keppra  [Levetiracetam ] Other (See Comments)    irritability    HOME MEDICATIONS: Outpatient Medications Prior to Visit  Medication Sig Dispense Refill   cetirizine (ZYRTEC) 10 MG tablet Take 10 mg by mouth daily as needed.      Cholecalciferol (VITAMIN D3) 1000 units CAPS Take 1 capsule (1,000 Units total) by mouth daily. 30 capsule     cyanocobalamin  (V-R VITAMIN B-12) 500 MCG tablet Take 1 tablet (500 mcg total) by mouth every Monday, Wednesday, and Friday.     escitalopram  (LEXAPRO ) 10 MG tablet Take 1 tablet (10 mg total) by mouth daily. (Patient not taking: Reported on 03/26/2024) 90 tablet 3   famotidine  (PEPCID ) 20 MG tablet Take 1 tablet (20 mg total) by mouth at bedtime. As needed. 30 tablet 5  fluticasone  (FLONASE ) 50 MCG/ACT nasal spray Place 2 sprays into both nostrils daily. (Patient taking differently: Place 2 sprays into both nostrils as needed.) 16 g 6   gabapentin  (NEURONTIN ) 300 MG capsule Take 1 capsule (300 mg total) by mouth at bedtime. 90 capsule 0   lamoTRIgine  (LAMICTAL ) 100 MG tablet TAKE 1 TABLET BY MOUTH IN THE MORNING AND TAKE 2 TABLETS BY MOUTH AT NIGHT 270 tablet 0   omeprazole  (PRILOSEC) 40 MG capsule Take 1 capsule (40 mg total) by mouth daily. 90 capsule 3   ondansetron  (ZOFRAN -ODT) 4 MG disintegrating tablet Take 1 tablet (4 mg total) by mouth every 8 (eight) hours as needed. 20 tablet 0   rizatriptan  (MAXALT -MLT) 10 MG disintegrating tablet Take 1 tablet (10 mg total) by mouth as needed for migraine. May repeat in 2 hours if needed 9 tablet 11   sildenafil  (VIAGRA ) 100 MG tablet Take 0.5-1 tablets (50-100 mg total) by mouth daily as needed for erectile dysfunction. 30 tablet 0   No facility-administered medications prior to visit.    PAST MEDICAL HISTORY: Past Medical History:  Diagnosis Date   Anxiety Febuary 2024   COVID-19 virus infection 09/2020   ED (erectile dysfunction) of non-organic origin 2014   thought psychogenic by urology Jacqulyne)   GERD (gastroesophageal reflux disease)    History of chicken pox    Migraines    Seizure disorder (HCC) 2015   presumed partial seizures, started on depakote , rec no driving for 6 mo (09/7982, Dr Onita LOACH)    PAST SURGICAL HISTORY: Past Surgical History:  Procedure Laterality Date   TONSILLECTOMY  1989    FAMILY HISTORY: Family History   Problem Relation Age of Onset   Irregular heart beat Mother    Diverticulitis Mother    Diabetes Father    Coronary artery disease Father 8       cabg 11, nonsmoker, obese   Prostate cancer Father 67   Heart disease Father    Heart Problems Sister    Heart disease Paternal Uncle    Heart disease Paternal Uncle    Cancer Maternal Grandfather        pancreatic   Coronary artery disease Paternal Grandfather 34       deceased from same   Heart disease Paternal Grandfather    Stroke Neg Hx     SOCIAL HISTORY: Social History   Socioeconomic History   Marital status: Married    Spouse name: Not on file   Number of children: 0   Years of education: 12   Highest education level: 12th grade  Occupational History   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  Tobacco Use   Smoking status: Never    Passive exposure: Current   Smokeless tobacco: Current    Types: Chew   Tobacco comments:    Chewing tobacco  Vaping Use   Vaping status: Never Used  Substance and Sexual Activity   Alcohol use: Not Currently    Comment: weekends, Friday-Sundays, a few beers a night and/or mixed drinks   Drug use: No   Sexual activity: Yes    Partners: Female    Birth control/protection: None  Other Topics Concern   Not on file  Social History Narrative   Not on file   Social Drivers of Health   Financial Resource Strain: Low Risk  (01/11/2023)   Overall Financial Resource Strain (CARDIA)    Difficulty of Paying Living Expenses: Not hard at all  Food Insecurity: No Food Insecurity (11/24/2023)  Hunger Vital Sign    Worried About Running Out of Food in the Last Year: Never true    Ran Out of Food in the Last Year: Never true  Transportation Needs: No Transportation Needs (11/24/2023)   PRAPARE - Administrator, Civil Service (Medical): No    Lack of Transportation (Non-Medical): No  Physical Activity: Sufficiently Active (01/11/2023)   Exercise Vital Sign    Days of Exercise  per Week: 5 days    Minutes of Exercise per Session: 30 min  Stress: Stress Concern Present (01/11/2023)   [employer] of Occupational Health - Occupational Stress Questionnaire    Feeling of Stress : To some extent  Social Connections: Moderately Integrated (01/11/2023)   Social Connection and Isolation Panel    Frequency of Communication with Friends and Family: Three times a week    Frequency of Social Gatherings with Friends and Family: Twice a week    Attends Religious Services: More than 4 times per year    Active Member of [employer] or Organizations: No    Attends Engineer, Structural: Not on file    Marital Status: Married  Catering Manager Violence: Not At Risk (11/24/2023)   Humiliation, Afraid, Rape, and Kick questionnaire    Fear of Current or Ex-Partner: No    Emotionally Abused: No    Physically Abused: No    Sexually Abused: No   PHYSICAL EXAM  There were no vitals filed for this visit.  There is no height or weight on file to calculate BMI.  Generalized: Well developed, in no acute distress   Neurological examination  Mentation: Alert oriented to time, place, history taking. Follows all commands speech and language fluent Cranial nerve II-XII: Pupils were equal round reactive to light. Extraocular movements were full, visual field were full on confrontational test. Facial sensation and strength were normal.  Head turning and shoulder shrug  were normal and symmetric. Motor: The motor testing reveals 5 over 5 strength of all 4 extremities. Good symmetric motor tone is noted throughout.  Sensory: Sensory testing is intact to soft touch on all 4 extremities. No evidence of extinction is noted.  Coordination: Cerebellar testing reveals good finger-nose-finger and heel-to-shin bilaterally.  Gait and station: Gait is normal.  Reflexes: Deep tendon reflexes are symmetric and normal bilaterally.   DIAGNOSTIC DATA (LABS, IMAGING, TESTING) - I reviewed patient  records, labs, notes, testing and imaging myself where available.  Lab Results  Component Value Date   WBC 10.3 11/17/2023   HGB 16.7 11/17/2023   HCT 48.5 11/17/2023   MCV 83.2 11/17/2023   PLT 314 11/17/2023      Component Value Date/Time   NA 144 11/24/2023 1625   K 3.4 (L) 11/24/2023 1625   CL 103 11/24/2023 1625   CO2 27 11/24/2023 1625   GLUCOSE 86 11/24/2023 1625   GLUCOSE 140 (H) 11/17/2023 0829   BUN 16 11/24/2023 1625   CREATININE 1.07 11/24/2023 1625   CREATININE 1.18 11/21/2015 1613   CALCIUM 9.6 11/24/2023 1625   PROT 7.5 11/17/2023 0835   PROT 6.9 11/11/2022 1612   ALBUMIN 5.1 (H) 11/17/2023 0835   ALBUMIN 5.0 11/11/2022 1612   AST 19 11/17/2023 0835   ALT 19 11/17/2023 0835   ALKPHOS 102 11/17/2023 0835   BILITOT 1.1 11/17/2023 0835   BILITOT 0.6 11/11/2022 1612   GFRNONAA >60 11/17/2023 0829   GFRAA 74 10/12/2018 1529   Lab Results  Component Value Date   CHOL 184  11/11/2022   HDL 61 11/11/2022   LDLCALC 100 (H) 11/11/2022   TRIG 131 11/11/2022   CHOLHDL 3.0 11/11/2022   No results found for: HGBA1C Lab Results  Component Value Date   VITAMINB12 644 11/11/2022   Lab Results  Component Value Date   TSH 1.650 11/11/2022   ASSESSMENT AND PLAN 43 y.o. year old male  has a past medical history of Anxiety (Febuary 2024), COVID-19 virus infection (09/2020), ED (erectile dysfunction) of non-organic origin (2014), GERD (gastroesophageal reflux disease), History of chicken pox, Migraines, and Seizure disorder (HCC) (2015). here with:  1.  Seizures, most recent seizure in November 2015 2.  Migraines  -He is doing well, symptoms under good control -Continue gabapentin  300 mg at bedtime, has been helpful for headaches -Continue Lamictal  for seizure prevention-In the past, was previously unable to tolerate Keppra  or Depakote , Vimpat  was too expensive; Topamax  resulted in fatigue -Follow-up in 1 year or sooner if needed, seeing PCP in 6 months, can  consider recheck of routine labs  Lauraine Born, AGNP-C, DNP 05/17/2024, 3:52 PM [facility] [address] [place], [place] [address] [phone]

## 2024-05-18 ENCOUNTER — Ambulatory Visit: Admitting: Neurology

## 2024-05-18 ENCOUNTER — Encounter: Payer: Self-pay | Admitting: Neurology

## 2024-05-29 ENCOUNTER — Ambulatory Visit: Admitting: Neurology

## 2024-05-29 ENCOUNTER — Encounter: Payer: Self-pay | Admitting: Neurology

## 2024-05-29 ENCOUNTER — Other Ambulatory Visit (HOSPITAL_COMMUNITY): Payer: Self-pay

## 2024-05-29 VITALS — BP 127/80 | HR 80 | Ht 71.0 in | Wt 191.4 lb

## 2024-05-29 DIAGNOSIS — G40009 Localization-related (focal) (partial) idiopathic epilepsy and epileptic syndromes with seizures of localized onset, not intractable, without status epilepticus: Secondary | ICD-10-CM | POA: Diagnosis not present

## 2024-05-29 DIAGNOSIS — R400 Somnolence: Secondary | ICD-10-CM | POA: Diagnosis not present

## 2024-05-29 DIAGNOSIS — G43009 Migraine without aura, not intractable, without status migrainosus: Secondary | ICD-10-CM
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
## 2024-05-29 NOTE — Patient Instructions (Signed)
 Good to meet you.  Schedule 1-hour EEG  2. Hold the Gabapentin  and monitor how you feel with the fatigue, mood  3. Schedule home sleep study  4. Continue Lamotrigine  100mg : take 1 tablet in AM, 2 tablets in PM. We can consider switching back to Topiramate  on your next visit  5. Keep a calendar of the staring or gaps in time, ask your wife to have you do an activity (raise arms) when she sees the staring  6. Follow-up in 3 months, call for any changes   Seizure Precautions: 1. If medication has been prescribed for you to prevent seizures, take it exactly as directed.  Do not stop taking the medicine without talking to your doctor first, even if you have not had a seizure in a long time.   2. Avoid activities in which a seizure would cause danger to yourself or to others.  Don't operate dangerous machinery, swim alone, or climb in high or dangerous places, such as on ladders, roofs, or girders.  Do not drive unless your doctor says you may.  3. If you have any warning that you may have a seizure, lay down in a safe place where you can't hurt yourself.    4.  No driving for 6 months from last seizure, as per [place]  state law.   Please refer to the following link on the Epilepsy Foundation of America's website for more information: http://www.epilepsyfoundation.org/answerplace/Social/driving/drivingu.cfm   5.  Maintain good sleep hygiene. Avoid alcohol.  6.  Contact your doctor if you have any problems that may be related to the medicine you are taking.  7.  Call 911 and bring the patient back to the ED if:        A.  The seizure lasts longer than 5 minutes.       B.  The patient doesn't awaken shortly after the seizure  C.  The patient has new problems such as difficulty seeing, speaking or moving  D.  The patient was injured during the seizure  E.  The patient has a temperature over 102 F (39C)  F.  The patient vomited and now is having trouble breathing

## 2024-06-11 ENCOUNTER — Ambulatory Visit

## 2024-06-11 ENCOUNTER — Encounter: Payer: Self-pay | Admitting: Neurology

## 2024-06-11 DIAGNOSIS — G43009 Migraine without aura, not intractable, without status migrainosus: Secondary | ICD-10-CM

## 2024-06-11 DIAGNOSIS — G40009 Localization-related (focal) (partial) idiopathic epilepsy and epileptic syndromes with seizures of localized onset, not intractable, without status epilepticus: Secondary | ICD-10-CM | POA: Diagnosis not present

## 2024-06-11 DIAGNOSIS — R400 Somnolence: Secondary | ICD-10-CM

## 2024-06-12 ENCOUNTER — Other Ambulatory Visit: Payer: Self-pay

## 2024-06-12 MED ORDER — LAMOTRIGINE 100 MG PO TABS: ORAL_TABLET | ORAL | 0 refills | Status: AC

## 2024-06-12 NOTE — Procedures (Signed)
 ELECTROENCEPHALOGRAM REPORT  Date of Study: 06/11/2024  Patient's Name: Elijah Huang MRN: [id number] Date of Birth: 06-13-1981  Referring Provider: Dr. Darice Shivers  Clinical History: This is a 43 year old man with seizures. His wife reports staring episodes. EEG for classification.  CNS Active Medications: Lamotrigine   Technical Summary: A multichannel digital 48-minute EEG recording measured by the international 10-20 system with electrodes applied with paste and impedances below 5000 ohms performed in our laboratory with EKG monitoring in an awake and asleep patient.  Hyperventilation and photic stimulation were performed.  The digital EEG was referentially recorded, reformatted, and digitally filtered in a variety of bipolar and referential montages for optimal display.    Description: The patient is awake and asleep during the recording.  During maximal wakefulness, there is a symmetric, medium voltage 10 Hz posterior dominant rhythm that attenuates with eye opening.  The record is symmetric.  During drowsiness and sleep, there is an increase in theta slowing of the background.  Vertex waves and symmetric sleep spindles were seen. Hyperventilation and photic stimulation did not elicit any abnormalities.  There were no epileptiform discharges or electrographic seizures seen.    EKG lead was unremarkable.  Impression: This 48-minute awake and asleep EEG is normal.    Clinical Correlation: A normal EEG does not exclude a clinical diagnosis of epilepsy.  If further clinical questions remain, prolonged EEG may be helpful.  Clinical correlation is advised.   Darice Shivers, M.D.

## 2024-06-12 NOTE — Progress Notes (Signed)
 EEG complete and ready for review.

## 2024-06-15 ENCOUNTER — Ambulatory Visit: Payer: Self-pay | Admitting: Neurology

## 2024-06-18 NOTE — Telephone Encounter (Signed)
 [facility] call ID: [id number]  Caller: Elijah Huang: Self  Ph: [phone]  Returning call from the office: [place]

## 2024-07-07 ENCOUNTER — Telehealth: Admitting: Nurse Practitioner

## 2024-07-07 DIAGNOSIS — B9689 Other specified bacterial agents as the cause of diseases classified elsewhere: Secondary | ICD-10-CM | POA: Diagnosis not present

## 2024-07-07 DIAGNOSIS — J019 Acute sinusitis, unspecified: Secondary | ICD-10-CM | POA: Diagnosis not present

## 2024-07-07 MED ORDER — AMOXICILLIN-POT CLAVULANATE 875-125 MG PO TABS: 1.0000 | ORAL_TABLET | Freq: Two times a day (BID) | ORAL | 0 refills | Status: AC

## 2024-07-07 MED ORDER — IPRATROPIUM BROMIDE 0.03 % NA SOLN: 2.0000 | Freq: Two times a day (BID) | NASAL | 0 refills | Status: AC

## 2024-07-07 NOTE — Progress Notes (Signed)
 E-Visit for Sinus Problems  We are sorry that you are not feeling well.  Here is how we plan to help!  Based on what you have shared with me it looks like you have sinusitis.  Sinusitis is inflammation and infection in the sinus cavities of the head.  Based on your presentation I believe you most likely have Acute Bacterial Sinusitis.  This is an infection caused by bacteria and is treated with antibiotics. I have prescribed Augmentin  875mg /125mg  one tablet twice daily with food, for 7 days. and I have also prescribed Ipratropium Bromide Nasal Spray Use 1 spray in each nostril twice daily as needed for drainage; discontinue if too drying You may use an oral decongestant such as Mucinex D or if you have glaucoma or high blood pressure use plain Mucinex. Saline nasal spray help and can safely be used as often as needed for congestion.  If you develop worsening sinus pain, fever or notice severe headache and vision changes, or if symptoms are not better after completion of antibiotic, please schedule an appointment with a health care provider.    Sinus infections are not as easily transmitted as other respiratory infection, however we still recommend that you avoid close contact with loved ones, especially the very young and elderly.  Remember to wash your hands thoroughly throughout the day as this is the number one way to prevent the spread of infection!  Home Care: Only take medications as instructed by your medical team. Complete the entire course of an antibiotic. Do not take these medications with alcohol. A steam or ultrasonic humidifier can help congestion.  You can place a towel over your head and breathe in the steam from hot water coming from a faucet. Avoid close contacts especially the very young and the elderly. Cover your mouth when you cough or sneeze. Always remember to wash your hands.  Get Help Right Away If: You develop worsening fever or sinus pain. You develop a severe head  ache or visual changes. Your symptoms persist after you have completed your treatment plan.  Make sure you Understand these instructions. Will watch your condition. Will get help right away if you are not doing well or get worse.  Your e-visit answers were reviewed by a board certified advanced clinical practitioner to complete your personal care plan.  Depending on the condition, your plan could have included both over the counter or prescription medications.  If there is a problem please reply  once you have received a response from your provider.  Your safety is important to us .  If you have drug allergies check your prescription carefully.    You can use MyChart to ask questions about today's visit, request a non-urgent call back, or ask for a work or school excuse for 24 hours related to this e-Visit. If it has been greater than 24 hours you will need to follow up with your provider, or enter a new e-Visit to address those concerns.  You will get an e-mail in the next two days asking about your experience.  I hope that your e-visit has been valuable and will speed your recovery. Thank you for using e-visits.  I have spent 5 minutes in review of e-visit questionnaire, review and updating patient chart, medical decision making and response to patient.   Deshaun Schou W Jonathan Kirkendoll, NP

## 2024-08-15 ENCOUNTER — Ambulatory Visit: Admitting: Neurology

## 2024-09-12 ENCOUNTER — Ambulatory Visit: Payer: Self-pay | Admitting: Neurology

## 2025-03-27 ENCOUNTER — Encounter (HOSPITAL_BASED_OUTPATIENT_CLINIC_OR_DEPARTMENT_OTHER): Admitting: Student
